# Patient Record
Sex: Male | Born: 1965 | Race: White | Hispanic: No | Marital: Single | Smoking: Never smoker
Health system: Southern US, Community
[De-identification: ages and names within clinical notes are randomized; demographics above are authoritative.]

## PROBLEM LIST (undated history)

## (undated) DIAGNOSIS — B079 Viral wart, unspecified: Secondary | ICD-10-CM

## (undated) DIAGNOSIS — T7840XA Allergy, unspecified, initial encounter: Secondary | ICD-10-CM

## (undated) DIAGNOSIS — K649 Unspecified hemorrhoids: Secondary | ICD-10-CM

## (undated) DIAGNOSIS — I341 Nonrheumatic mitral (valve) prolapse: Secondary | ICD-10-CM

## (undated) DIAGNOSIS — M25572 Pain in left ankle and joints of left foot: Secondary | ICD-10-CM

## (undated) DIAGNOSIS — G809 Cerebral palsy, unspecified: Secondary | ICD-10-CM

## (undated) DIAGNOSIS — I10 Essential (primary) hypertension: Secondary | ICD-10-CM

## (undated) DIAGNOSIS — E785 Hyperlipidemia, unspecified: Secondary | ICD-10-CM

## (undated) HISTORY — DX: Pain in left ankle and joints of left foot: M25.572

## (undated) HISTORY — DX: Viral wart, unspecified: B07.9

## (undated) HISTORY — DX: Hyperlipidemia, unspecified: E78.5

## (undated) HISTORY — DX: Essential (primary) hypertension: I10

## (undated) HISTORY — DX: Allergy, unspecified, initial encounter: T78.40XA

## (undated) HISTORY — DX: Nonrheumatic mitral (valve) prolapse: I34.1

## (undated) HISTORY — DX: Cerebral palsy, unspecified: G80.9

## (undated) HISTORY — DX: Unspecified hemorrhoids: K64.9

## (undated) HISTORY — PX: LEG SURGERY: SHX1003

---

## 2001-02-12 ENCOUNTER — Encounter: Payer: Self-pay | Admitting: Internal Medicine

## 2005-01-04 ENCOUNTER — Ambulatory Visit: Payer: Self-pay | Admitting: Internal Medicine

## 2009-03-01 ENCOUNTER — Ambulatory Visit: Payer: Self-pay | Admitting: Internal Medicine

## 2009-03-07 ENCOUNTER — Ambulatory Visit: Payer: Self-pay | Admitting: Internal Medicine

## 2009-03-09 LAB — CONVERTED CEMR LAB
ALT: 23 units/L (ref 0–53)
AST: 24 units/L (ref 0–37)
Basophils Absolute: 0 10*3/uL (ref 0.0–0.1)
CO2: 29 meq/L (ref 19–32)
Calcium: 9.1 mg/dL (ref 8.4–10.5)
Cholesterol: 279 mg/dL — ABNORMAL HIGH (ref 0–200)
Direct LDL: 207.7 mg/dL
Eosinophils Relative: 1.5 % (ref 0.0–5.0)
GFR calc non Af Amer: 85.81 mL/min (ref >60)
Glucose, Bld: 93 mg/dL (ref 70–99)
HCT: 42.8 % (ref 39.0–52.0)
HDL: 48.9 mg/dL (ref >39.00)
Lymphs Abs: 2.2 10*3/uL (ref 0.7–4.0)
Monocytes Absolute: 0.6 10*3/uL (ref 0.1–1.0)
Monocytes Relative: 11.3 % (ref 3.0–12.0)
Neutrophils Relative %: 46.5 % (ref 43.0–77.0)
Platelets: 296 10*3/uL (ref 150.0–400.0)
Potassium: 4.2 meq/L (ref 3.5–5.1)
RDW: 12.5 % (ref 11.5–14.6)
Sodium: 141 meq/L (ref 135–145)
Total CHOL/HDL Ratio: 6
Triglycerides: 93 mg/dL (ref 0.0–149.0)
WBC: 5.3 10*3/uL (ref 4.5–10.5)

## 2009-12-20 DIAGNOSIS — I10 Essential (primary) hypertension: Secondary | ICD-10-CM

## 2009-12-20 HISTORY — DX: Essential (primary) hypertension: I10

## 2009-12-21 ENCOUNTER — Ambulatory Visit: Payer: Self-pay | Admitting: Internal Medicine

## 2009-12-21 DIAGNOSIS — I10 Essential (primary) hypertension: Secondary | ICD-10-CM | POA: Insufficient documentation

## 2009-12-22 ENCOUNTER — Telehealth: Payer: Self-pay | Admitting: Internal Medicine

## 2009-12-26 ENCOUNTER — Telehealth (INDEPENDENT_AMBULATORY_CARE_PROVIDER_SITE_OTHER): Payer: Self-pay | Admitting: *Deleted

## 2009-12-30 ENCOUNTER — Encounter: Payer: Self-pay | Admitting: Internal Medicine

## 2009-12-30 ENCOUNTER — Telehealth (INDEPENDENT_AMBULATORY_CARE_PROVIDER_SITE_OTHER): Payer: Self-pay | Admitting: *Deleted

## 2010-01-11 ENCOUNTER — Ambulatory Visit: Payer: Self-pay | Admitting: Internal Medicine

## 2010-01-12 ENCOUNTER — Telehealth (INDEPENDENT_AMBULATORY_CARE_PROVIDER_SITE_OTHER): Payer: Self-pay | Admitting: *Deleted

## 2010-01-13 LAB — CONVERTED CEMR LAB
BUN: 12 mg/dL (ref 6–23)
Chloride: 104 meq/L (ref 96–112)
Creatinine, Ser: 0.8 mg/dL (ref 0.4–1.5)
GFR calc non Af Amer: 110.59 mL/min (ref >60)

## 2010-02-04 ENCOUNTER — Telehealth: Payer: Self-pay | Admitting: Family Medicine

## 2010-02-04 ENCOUNTER — Emergency Department (HOSPITAL_BASED_OUTPATIENT_CLINIC_OR_DEPARTMENT_OTHER): Admission: EM | Admit: 2010-02-04 | Discharge: 2010-02-04 | Payer: Self-pay | Admitting: Emergency Medicine

## 2010-02-09 ENCOUNTER — Ambulatory Visit: Payer: Self-pay | Admitting: Internal Medicine

## 2010-02-09 DIAGNOSIS — M549 Dorsalgia, unspecified: Secondary | ICD-10-CM | POA: Insufficient documentation

## 2010-04-11 ENCOUNTER — Ambulatory Visit: Payer: Self-pay | Admitting: Internal Medicine

## 2010-04-14 LAB — CONVERTED CEMR LAB
Calcium: 9.3 mg/dL (ref 8.4–10.5)
GFR calc non Af Amer: 104.43 mL/min (ref >60)
Potassium: 4.5 meq/L (ref 3.5–5.1)
Sodium: 138 meq/L (ref 135–145)

## 2010-04-19 DIAGNOSIS — E785 Hyperlipidemia, unspecified: Secondary | ICD-10-CM

## 2010-04-19 HISTORY — DX: Hyperlipidemia, unspecified: E78.5

## 2010-04-21 ENCOUNTER — Telehealth (INDEPENDENT_AMBULATORY_CARE_PROVIDER_SITE_OTHER): Payer: Self-pay | Admitting: *Deleted

## 2010-05-03 ENCOUNTER — Ambulatory Visit: Payer: Self-pay | Admitting: Internal Medicine

## 2010-05-09 ENCOUNTER — Telehealth (INDEPENDENT_AMBULATORY_CARE_PROVIDER_SITE_OTHER): Payer: Self-pay | Admitting: *Deleted

## 2010-05-09 LAB — CONVERTED CEMR LAB
ALT: 17 units/L (ref 0–53)
Basophils Absolute: 0 10*3/uL (ref 0.0–0.1)
Cholesterol: 268 mg/dL — ABNORMAL HIGH (ref 0–200)
Eosinophils Absolute: 0.1 10*3/uL (ref 0.0–0.7)
Eosinophils Relative: 1.3 % (ref 0.0–5.0)
HCT: 41.5 % (ref 39.0–52.0)
HDL: 58.6 mg/dL (ref >39.00)
Lymphs Abs: 1.3 10*3/uL (ref 0.7–4.0)
MCHC: 34.2 g/dL (ref 30.0–36.0)
MCV: 90.5 fL (ref 78.0–100.0)
Monocytes Absolute: 0.5 10*3/uL (ref 0.1–1.0)
Neutrophils Relative %: 58.1 % (ref 43.0–77.0)
Platelets: 326 10*3/uL (ref 150.0–400.0)
RDW: 14.2 % (ref 11.5–14.6)
Triglycerides: 56 mg/dL (ref 0.0–149.0)
VLDL: 11.2 mg/dL (ref 0.0–40.0)

## 2010-06-22 ENCOUNTER — Ambulatory Visit: Payer: Self-pay | Admitting: Internal Medicine

## 2010-06-28 LAB — CONVERTED CEMR LAB
ALT: 25 units/L (ref 0–53)
LDL Cholesterol: 97 mg/dL (ref 0–99)
Total CHOL/HDL Ratio: 3
Triglycerides: 69 mg/dL (ref 0.0–149.0)

## 2010-07-06 ENCOUNTER — Telehealth (INDEPENDENT_AMBULATORY_CARE_PROVIDER_SITE_OTHER): Payer: Self-pay | Admitting: *Deleted

## 2010-07-13 ENCOUNTER — Telehealth (INDEPENDENT_AMBULATORY_CARE_PROVIDER_SITE_OTHER): Payer: Self-pay | Admitting: *Deleted

## 2010-11-01 ENCOUNTER — Ambulatory Visit: Payer: Self-pay | Admitting: Internal Medicine

## 2010-11-03 DIAGNOSIS — E785 Hyperlipidemia, unspecified: Secondary | ICD-10-CM | POA: Insufficient documentation

## 2010-11-08 LAB — CONVERTED CEMR LAB
ALT: 24 units/L (ref 0–53)
BUN: 11 mg/dL (ref 6–23)
Calcium: 9.1 mg/dL (ref 8.4–10.5)
GFR calc non Af Amer: 101.38 mL/min (ref >60.00)
Glucose, Bld: 77 mg/dL (ref 70–99)
Sodium: 140 meq/L (ref 135–145)

## 2010-12-19 NOTE — Progress Notes (Signed)
Summary: Refill Request  Phone Note Refill Request Call back at [phone] Message from:  Pharmacy on April 21, 2010 8:42 AM  Refills Requested: Medication #1:  LOSARTAN POTASSIUM 50 MG TABS 1 by mouth once daily   Dosage confirmed as above?Dosage Confirmed   Supply Requested: 1 month   [facility]  Next Appointment Scheduled: 6.15.11 Initial call taken by: Harold Barban,  April 21, 2010 8:42 AM    Prescriptions: LOSARTAN POTASSIUM 50 MG TABS (LOSARTAN POTASSIUM) 1 by mouth once daily  #30 x 2   Entered by:   Jeremy Johann CMA   Authorized by:   Nolon Rod. Paz MD   Signed by:   Jeremy Johann CMA on 04/21/2010   Method used:   Faxed to ...       [facility] [phone]* (retail)       [address]       [place], [place]  [address]       Ph: [phone]       Fax: [phone]   RxID:   [id number]

## 2010-12-19 NOTE — Progress Notes (Signed)
Summary: lmom ref labs  Phone Note Outgoing Call   Call placed by: Kandice Hams,  January 12, 2010 4:06 PM Summary of Call: lmom to call ref ; lab results and Dr Drue Novel recommendations .Kandice Hams  January 12, 2010 4:07 PM  Initial call taken by: Kandice Hams,  January 12, 2010 4:07 PM  Follow-up for Phone Call        Spoke with pt informed of lab results; and Dr Drue Novel recommendations. Pt says feeling a lot better and ov scheduled .Kandice Hams  January 13, 2010 11:55 AM  Follow-up by: Kandice Hams,  January 13, 2010 11:55 AM

## 2010-12-19 NOTE — Progress Notes (Signed)
Summary: prior auth APPROVED for losartan  Phone Note From Pharmacy   Caller: fax from pharmacy [phone] Summary of Call: Prior auth required on Losaratan 50mm 2 by mouth once daily.  Plan limit exceeded.  [phone] Initial call taken by: Shary Decamp,  December 26, 2009 5:04 PM  Follow-up for Phone Call        prior auth in process for losartan-[employer] Follow-up by: Kandice Hams,  December 29, 2009 4:28 PM  Additional Follow-up for Phone Call Additional follow up Details #1::        PRIOR AUTH APPROVED  FOR LOSARTAN  UNTIL 12/29/10.  [facility] .Kandice Hams  December 30, 2009 10:04 AM  Additional Follow-up by: Kandice Hams,  December 30, 2009 10:02 AM

## 2010-12-19 NOTE — Progress Notes (Signed)
Summary: refill  Phone Note Refill Request Message from:  Fax from Pharmacy on July 06, 2010 9:43 AM  Refills Requested: Medication #1:  SIMVASTATIN 40 MG TABS Take 1 tab daily at bedtime. Leward Quan [place] - fax [phone]  Initial call taken by: Okey Regal Spring,  July 06, 2010 9:43 AM    Prescriptions: SIMVASTATIN 40 MG TABS (SIMVASTATIN) Take 1 tab daily at bedtime  #30 x 5   Entered by:   Army Fossa CMA   Authorized by:   Nolon Rod. Paz MD   Signed by:   Army Fossa CMA on 07/06/2010   Method used:   Electronically to        [facility] [phone]* (retail)       [address]       [place], [place]  [address]       Ph: [phone]       Fax: [phone]   RxID:   [id number]

## 2010-12-19 NOTE — Assessment & Plan Note (Signed)
Summary: CPX  - jr   Vital Signs:  Patient profile:   45 year old male Height:      67.25 inches Weight:      146 pounds Temp:     98.4 degrees F oral Pulse rate:   83 / minute BP sitting:   122 / 90  (left arm)  Vitals Entered By: Jeremy Johann CMA (May 03, 2010 12:40 PM) CC: 3 month f/u Comments --fasting REVIEWED MED LIST, PATIENT AGREED DOSE AND INSTRUCTION CORRECT    History of Present Illness: CPX  HTN: ambulatory BPs 120s/70 on losartan 50mg  a day  Allergies (verified): No Known Drug Allergies  Past History:  Past Medical History: Reviewed history from 12/21/2009 and no changes required. 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 Leg surgery to release muscles d/t "cerebral palsy"  Family History: Reviewed history from 03/01/2009 and no changes required. 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 CV:  Denies chest pain or discomfort and swelling of feet. Resp:  Denies cough and shortness of breath. GI:  Denies bloody stools, diarrhea, nausea, and vomiting. GU:  Denies dysuria and hematuria. MS:  occasionally LBP, rarely . Psych:  Denies anxiety and depression; feels well emotionally .  Physical Exam  General:  alert, well-developed, and well-nourished.   Neck:  no masses, no thyromegaly, and normal carotid upstroke.   Lungs:  normal respiratory effort, no intercostal retractions, no accessory muscle use, and normal breath sounds.   Heart:  normal rate, regular rhythm, and no murmur.   Abdomen:   soft, non-tender, no distention, no masses, no guarding, and no rigidity.   Extremities:  no pretibial edema bilaterally  Neurologic:  alert & oriented X3.   Psych:  memory intact for recent and remote, normally interactive, good eye contact, not anxious appearing, and not depressed appearing.     Impression & Recommendations:  Problem # 1:  HEALTH SCREENING (ICD-V70.0) Td 2005 labs continue healthy lifestyle, has lost several pounds.    Orders: Venipuncture (38101) TLB-Lipid Panel (80061-LIPID) TLB-TSH (Thyroid Stimulating Hormone) (84443-TSH) TLB-CBC Platelet - w/Differential (85025-CBCD) TLB-ALT (SGPT) (84460-ALT) TLB-AST (SGOT) (84450-SGOT)  Problem # 2:  ESSENTIAL HYPERTENSION (ICD-401.9) ambulatory BPs are  well controlled, diastolic BP today slightly elevated Plan: No change Will bring his  BP machine to be  checked against ours  His updated medication list for this problem includes:    Losartan Potassium 50 Mg Tabs (Losartan potassium) .Marland Kitchen... 1 by mouth once daily  BP today: 122/90 Prior BP: 120/90 (02/09/2010)  Labs Reviewed: K+: 4.5 (04/11/2010) Creat: : 0.8 (04/11/2010)   Chol: 279 (03/07/2009)   HDL: 48.90 (03/07/2009)   TG: 93.0 (03/07/2009)  Complete Medication List: 1)  Losartan Potassium 50 Mg Tabs (Losartan potassium) .Marland Kitchen.. 1 by mouth once daily  Patient Instructions: 1)  continue checking her BP, goal is a  BP of  less than 140-85. 2)  Please bring your BP machine at your convenience and check it with my nurse  3)  Please schedule a follow-up appointment in 6 months .  Prescriptions: LOSARTAN POTASSIUM 50 MG TABS (LOSARTAN POTASSIUM) 1 by mouth once daily  #90 x 3   Entered and Authorized by:   Elita Quick E. Kashae Carstens MD   Signed by:   Nolon Rod. Marcella Dunnaway MD on 05/03/2010   Method used:   Print then Give to Patient   RxID:   [id number]

## 2010-12-19 NOTE — Medication Information (Signed)
Summary: Prior Authorization & Approval for Losartan [place]  Prior Authorization & Approval for Losartan [employer]   Imported By: Lanelle Bal 01/03/2010 11:32:37  _____________________________________________________________________  External Attachment:    Type:   Image     Comment:   External Document

## 2010-12-19 NOTE — Assessment & Plan Note (Signed)
Summary: F/u on BP still running high - jr   Vital Signs:  Patient profile:   45 year old male Height:      67.25 inches Weight:      160 pounds BMI:     24.96 Pulse rate:   82 / minute BP sitting:   140 / 96  Vitals Entered By: Shary Decamp (December 21, 2009 11:51 AM) CC: rov - elevated bp Is Patient Diabetic? 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  Physical Exam  General:  alert, well-developed, and well-nourished.   Lungs:  normal respiratory effort, no intercostal retractions, no accessory muscle use, and normal breath sounds.   Heart:  normal rate, regular rhythm, and no murmur.   Extremities:  no pretibial edema bilaterally    Impression & Recommendations:  Problem # 1:  ESSENTIAL HYPERTENSION (ICD-401.9)  EKG NSR start meds   BP today: 140/96 Prior BP: 156/92 (03/01/2009)  Labs Reviewed: K+: 4.2 (03/07/2009) Creat: : 1.0 (03/07/2009)   Chol: 279 (03/07/2009)   HDL: 48.90 (03/07/2009)   TG: 93.0 (03/07/2009)  His updated medication list for this problem includes:    Losartan Potassium 50 Mg Tabs (Losartan potassium) .Marland Kitchen... 2 by  mouth once daily  Complete Medication List: 1)  Cordran 4 Mcg/sqcm Tape (Flurandrenolide) 2)  Losartan Potassium 50 Mg Tabs (Losartan potassium) .... 2 by mouth once daily  Other Orders: EKG w/ Interpretation (93000)  Patient Instructions: 1)  start losartan 50mg  1 a day 2)  check your BP daily , call if side effects  3)  increase to 2 tabs a day in one week if the BP is still > 140/85 4)  low salt diet! 5)  30 minutes of exercise a day! 6)  nurse visit in 3 weeks for BP check and BMP  7)  Please schedule a follow-up appointment in 3 months .  Prescriptions: LOSARTAN POTASSIUM 50 MG TABS (LOSARTAN POTASSIUM) 2 by mouth once daily  #60 x 1   Entered and Authorized by:   Elita Quick E. Sway Guttierrez MD   Signed by:   Nolon Rod. Cloie Wooden MD on 12/21/2009   Method used:   Print then Give to Patient   RxID:   [id number]

## 2010-12-19 NOTE — Progress Notes (Signed)
Summary: back out can't walk   Phone Note Call from Patient Call back at [phone]   Caller: Mom-Sylvia Summary of Call: Son hurt back now having trouble getting up to walk and lots of pain informed to go to emergency room will need xrays to make sure no pinch nerves or buldging disc, also to make sure nothing herniated mom agreed will take patient to [place]. Initial call taken by: Doristine Devoid,  February 04, 2010 11:11 AM  Follow-up for Phone Call        note reviewed per nurse.  Agree with advice for ER evaluation given hx of trauma and inablility to ambulate.  Wil likely need some x-rays. Follow-up by: Evelena Peat MD,  February 04, 2010 12:06 PM

## 2010-12-19 NOTE — Progress Notes (Signed)
Summary: -lab  Phone Note Outgoing Call   Call placed by: [facility] CMA,  May 09, 2010 4:34 PM Details for Reason: advise patient: Despite his healthy lifestyle, his cholesterol continued to be very elevated. He has a family history of heart disease I recommend to start simvastatin 40 mg daily if patient is willing to do so, call in a prescription and arrange FLP, AST, ALT in 6 weeks dx ---hyperlipidemia rest of the labs are normal Summary of Call: left message to call  office.Marland KitchenMarland KitchenMarland KitchenFelecia Deloach CMA  May 09, 2010 4:34 PM   Follow-up for Phone Call        DISCUSS WITH PATIENT, will give a return call once he speak with sister who is a nurse  about med.............Marland KitchenFelecia Deloach CMA  May 10, 2010 8:39 AM  rx sent in to pharmacy letter mailed .Marland KitchenMarland KitchenFelecia Deloach CMA  May 10, 2010 8:44 AM     New/Updated Medications: SIMVASTATIN 40 MG TABS (SIMVASTATIN) Take 1 tab daily at bedtime Prescriptions: SIMVASTATIN 40 MG TABS (SIMVASTATIN) Take 1 tab daily at bedtime  #30 x 1   Entered by:   Jeremy Johann CMA   Authorized by:   Nolon Rod. Paz MD   Signed by:   Jeremy Johann CMA on 05/10/2010   Method used:   Faxed to ...       [facility] [phone]* (retail)       [address]       [place], [place]  [address]       Ph: [phone]       Fax: [phone]   RxID:   [id number]

## 2010-12-19 NOTE — Progress Notes (Signed)
Summary: refill - sent to pharm [phone] - didnt receive  Phone Note Refill Request Message from:  Fax from Pharmacy on July 13, 2010 8:26 AM  Refills Requested: Medication #1:  SIMVASTATIN 40 MG TABS Take 1 tab daily at bedtime. pharmacy sent another request for refill - 1st refill sent [phone] [address] fax [phone] Jani Files [phone]  Initial call taken by: Okey Regal Spring,  July 13, 2010 8:27 AM    Prescriptions: SIMVASTATIN 40 MG TABS (SIMVASTATIN) Take 1 tab daily at bedtime  #30 x 5   Entered by:   Army Fossa CMA   Authorized by:   Nolon Rod. Paz MD   Signed by:   Army Fossa CMA on 07/13/2010   Method used:   Electronically to        [facility] [phone]* (retail)       [address]       [place], [place]  [address]       Ph: [phone]       Fax: [phone]   RxID:   [id number]

## 2010-12-19 NOTE — Assessment & Plan Note (Signed)
Summary: 3 wk. bp check - jr  Nurse Visit   Vital Signs:  Patient profile:   45 year old male Height:      67.25 inches Pulse rate:   60 / minute BP sitting:   140 / 82  Vitals Entered By: Shary Decamp (January 11, 2010 10:36 AM) CC: bp check, bmp drawn today Comments  - pt states that he still "gets lightheaded" & feels "fuzzy" BP READINGS @ HOME: 120's-130's/80's Shary Decamp  January 11, 2010 2:10 PM    Impression & Recommendations:  Problem # 1:  ESSENTIAL HYPERTENSION (ICD-401.9)  His updated medication list for this problem includes:    Losartan Potassium 50 Mg Tabs (Losartan potassium) .Marland Kitchen... 1 by mouth once daily  Orders: Venipuncture (16109) TLB-BMP (Basic Metabolic Panel-BMET) (80048-METABOL)  BP today: 140/82 Prior BP: 140/96 (12/21/2009)  Labs Reviewed: K+: 4.2 (03/07/2009) Creat: : 1.0 (03/07/2009)   Chol: 279 (03/07/2009)   HDL: 48.90 (03/07/2009)   TG: 93.0 (03/07/2009)  Complete Medication List: 1)  Cordran 4 Mcg/sqcm Tape (Flurandrenolide) 2)  Losartan Potassium 50 Mg Tabs (Losartan potassium) .Marland Kitchen.. 1 by mouth once daily   Current Medications (verified): 1)  Cordran 4 Mcg/sqcm Tape (Flurandrenolide) 2)  Losartan Potassium 50 Mg Tabs (Losartan Potassium) .Marland Kitchen.. 1 By Mouth Once Daily  Allergies (verified): No Known Drug Allergies  Orders Added: 1)  Venipuncture [36415] 2)  TLB-BMP (Basic Metabolic Panel-BMET) [80048-METABOL] 3)  Est. Patient Level I [60454]

## 2010-12-19 NOTE — Progress Notes (Signed)
  Phone Note Call from Patient   Caller: Patient Summary of Call: pt called has a cough  and on Losartan for BP, wanted to know what med could he use for cough. Recommend Coricidin HBP. Call office if no better. Pt agreed .Kandice Hams  December 30, 2009 11:55 AM  Initial call taken by: Kandice Hams,  December 30, 2009 11:55 AM

## 2010-12-19 NOTE — Progress Notes (Signed)
Summary: s/e  Phone Note Call from Patient Call back at [phone], [phone]   Summary of Call: Patient left a note @ the front desk: "I took first dose of losartan & a couple of h ours later felt light headed & stomach was gassy.  Is this something that my body will adapt to & s/e go away?"  Spoke with pt - sxs lasted about 1 hour after taking 1 tablet.  He felt "drugged" & fatigued.  Did not check BP.  He had really bad gas pains that lasted through the night [facility]  December 22, 2009 12:32 PM   Follow-up for Phone Call        losartan can cause dyspepsia (GI symptoms) suggest re-try 1 or 2 more days, if unable to tolerate will switch to something else. If  "drugged", suggest to check his BP Emberlynn Riggan E. Elany Felix MD  December 22, 2009 1:15 PM   discussed with pt Shary Decamp  December 22, 2009 2:42 PM

## 2010-12-19 NOTE — Assessment & Plan Note (Signed)
Summary: er/fu/kdc   Vital Signs:  Patient profile:   45 year old male Height:      67.25 inches Weight:      153 pounds BMI:     23.87 Pulse rate:   66 / minute BP sitting:   120 / 90  Vitals Entered By: R.Peeler CC: er f/u Comments lower back pain "hard to get up after sitting" er prescribed vicodin, diazepam   History of Present Illness: 3 weeks ago he developed a sudden, quite intense, sharp low back pain when  he reached down to tide his  shoes since then he was left with a soreness in the lower back  6 days ago, he developed a  bilateral, low back pain this pain is described as a spasm, "a gripp in my back" on and off, increase with change in position went to the ER, records reviewed: he  got  Toradol and Valium and got better, was released home with Norco # 15 and Valium #12  at present, the  pain is still there but not as severe  review of systems no fever no bladder or bowel incontinence denies previous episodes like this one no recent injury or previous surgeries in his  back  Allergies: No Known Drug Allergies  Past History:  Past Medical History: Reviewed history from 12/21/2009 and no changes required. 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 Leg surgery to release muscles d/t "cerebral palsy"  Review of Systems      See HPI  Physical Exam  General:  alert and well-developed.   Msk:  not tender to palpation on the spine he does seem to be in pain when he lays down in the table to be examined Neurologic:  walks w/ more  difficulty than usual DTRs in the lower extremities are symmetric strength seems symmetric    Impression & Recommendations:  Problem # 1:  BACK PAIN (ICD-724.5) acute back pain,  will treat in the standard  fashion: Prednisone, muscle relaxants, painkillers if no better, will refer to Dr.Kirstein His updated medication list for this problem includes:    Aleve 220 Mg Tabs (Naproxen sodium) .Marland Kitchen... 1 daily    Vicodin 5-500 Mg Tabs (Hydrocodone-acetaminophen) ..... One by mouth 3 times a day  as needed    Flexeril 10 Mg Tabs (Cyclobenzaprine hcl) ..... One by mouth twice a day and  as needed for pain  Complete Medication List: 1)  Cordran 4 Mcg/sqcm Tape (Flurandrenolide) 2)  Losartan Potassium 50 Mg Tabs (Losartan potassium) .Marland Kitchen.. 1 by mouth once daily 3)  Aleve 220 Mg Tabs (Naproxen sodium) .Marland Kitchen.. 1 daily 4)  Vicodin 5-500 Mg Tabs (Hydrocodone-acetaminophen) .... One by mouth 3 times a day  as needed 5)  Flexeril 10 Mg Tabs (Cyclobenzaprine hcl) .... One by mouth twice a day and  as needed for pain 6)  Prednisone 10 Mg Tabs (Prednisone) .... 4 x2, 3x2,2x2, 1x2  Patient Instructions: 1)  rest, warm compress 2)  prednisone for a few days 3)  Flexeril twice a day as needed, watch for drowsiness 4)  for pain take Aleve, Vicodin if the pain is severe, watch for drowsiness 5)  Call in two weeks if no  better Prescriptions: VICODIN 5-500 MG TABS (HYDROCODONE-ACETAMINOPHEN) one by mouth 3 times  a day  as needed  #40 x 0   Entered and Authorized by:   Nolon Rod. Thalia Turkington MD   Signed by:   Nolon Rod. Brenlee Koskela MD on 02/09/2010   Method used:   Print then Give to Patient   RxID:   [id number] PREDNISONE 10 MG TABS (PREDNISONE) 4 x2, 3x2,2x2, 1x2  #20 x 0   Entered and Authorized by:   Nolon Rod. Lesley Atkin MD   Signed by:   Nolon Rod. Decklin Weddington MD on 02/09/2010   Method used:   Print then Give to Patient   RxID:   [id number] FLEXERIL 10 MG TABS (CYCLOBENZAPRINE HCL) one by mouth twice a day and  as needed for pain  #40 x 0   Entered and Authorized by:   Nolon Rod. Michall Noffke MD   Signed by:   Nolon Rod. Porshea Janowski MD on 02/09/2010   Method used:   Print then Give to Patient   RxID:   [id number]

## 2010-12-21 NOTE — Assessment & Plan Note (Signed)
Summary: 6 month ov/nta   Vital Signs:  Patient profile:   45 year old male Weight:      151 pounds Pulse rate:   70 / minute Pulse rhythm:   regular BP sitting:   146 / 84  (left arm) Cuff size:   large  Vitals Entered By: Army Fossa CMA (November 03, 2010 10:19 AM) CC: 6 month f/u- fsating  Comments PTs BP machine- [address] [facility] [address]   History of Present Illness: ROV tired, not exercising d/t work schedule   ROS ambulatory BPs in the 120/70 per his machine (which read a litter higher than ours) diet-- not as good as before, eating out more frecuent good medication compliance w/ cholesterol med no unusual myalgias  CP (-)  SOB (-)  edema (-)      Current Medications (verified): 1)  Losartan Potassium 50 Mg Tabs (Losartan Potassium) .Marland Kitchen.. 1 By Mouth Once Daily 2)  Simvastatin 40 Mg Tabs (Simvastatin) .... 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 Leg surgery to release muscles d/t "cerebral palsy"  Social History: Reviewed history from 05/03/2010 and no changes required. 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   Lungs:  normal respiratory effort, no intercostal retractions, no accessory muscle use, and normal breath sounds.   Heart:  normal rate, regular rhythm, and no murmur.   Extremities:  no pretibial edema bilaterally    Impression & Recommendations:  Problem # 1:  ESSENTIAL HYPERTENSION (ICD-401.9) good ambulatory BPs  no change  His updated medication list for this problem  includes:    Losartan Potassium 50 Mg Tabs (Losartan potassium) .Marland Kitchen... 1 by mouth once daily  BP today: 146/84 Prior BP: 122/90 (05/03/2010)  Labs Reviewed: K+: 4.5 (04/11/2010) Creat: : 0.8 (04/11/2010)   Chol: 160 (06/22/2010)   HDL: 49.40 (06/22/2010)   LDL: 97 (06/22/2010)   TG: 69.0 (06/22/2010)  Orders: Venipuncture (00938) TLB-BMP (Basic Metabolic Panel-BMET) (80048-METABOL) Specimen Handling (18299)  Problem # 2:  HYPERLIPIDEMIA (ICD-272.4) results reviewed no change  His updated medication list for this problem includes:    Simvastatin 40 Mg Tabs (Simvastatin) .Marland Kitchen... Take 1 tab daily at bedtime  Labs Reviewed: SGOT: 27 (06/22/2010)   SGPT: 25 (06/22/2010)   HDL:49.40 (06/22/2010), 58.60 (05/03/2010)  LDL:97 (06/22/2010)  Chol:160 (06/22/2010), 268 (05/03/2010)  Trig:69.0 (06/22/2010), 56.0 (05/03/2010)  Orders: TLB-ALT (SGPT) (84460-ALT) TLB-AST (SGOT) (84450-SGOT) Specimen Handling (37169)  Complete Medication List: 1)  Losartan Potassium 50 Mg Tabs (Losartan potassium) .Marland Kitchen.. 1 by mouth once daily 2)  Simvastatin 40 Mg Tabs (Simvastatin) .... Take 1 tab daily at bedtime  Patient Instructions: 1)  Please schedule a follow-up appointment in 6 months, fasting, physical exam Prescriptions: SIMVASTATIN 40 MG TABS (SIMVASTATIN) Take 1 tab daily at bedtime  #90 x 3   Entered and Authorized by:   Nolon Rod. Jovonni Borquez MD   Signed by:   Nolon Rod. Ottie Tillery MD on 11/03/2010   Method used:   Print then Give to Patient   RxID:   [id number] LOSARTAN POTASSIUM 50 MG TABS (LOSARTAN  POTASSIUM) 1 by mouth once daily  #90 x 3   Entered and Authorized by:   Nolon Rod. Estel Tonelli MD   Signed by:   Nolon Rod. Jenipher Havel MD on 11/03/2010   Method used:   Print then Give to Patient   RxID:   [id number]    Orders Added: 1)  Venipuncture [21308] 2)  TLB-BMP (Basic Metabolic Panel-BMET) [80048-METABOL] 3)  TLB-ALT (SGPT) [84460-ALT] 4)  TLB-AST (SGOT) [84450-SGOT] 5)  Specimen Handling [99000] 6)  Est.  Patient Level III [65784]   Immunization History:  Influenza Immunization History:    Influenza:  per pt  (08/19/2010)   Immunization History:  Influenza Immunization History:    Influenza:  per pt  (08/19/2010)

## 2011-01-15 ENCOUNTER — Telehealth (INDEPENDENT_AMBULATORY_CARE_PROVIDER_SITE_OTHER): Payer: Self-pay | Admitting: *Deleted

## 2011-01-25 NOTE — Progress Notes (Signed)
Summary: refill  Phone Note Refill Request Message from:  Fax from Pharmacy on January 15, 2011 10:18 AM  Refills Requested: Medication #1:  SIMVASTATIN 40 MG TABS Take 1 tab daily at bedtime. [facility] - fax [phone]  Initial call taken by: Okey Regal [place],  January 15, 2011 10:18 AM  Follow-up for Phone Call        Pt lost rx that was given to him. Resent in Zocor. Army Fossa CMA  January 15, 2011 10:40 AM     Prescriptions: SIMVASTATIN 40 MG TABS (SIMVASTATIN) Take 1 tab daily at bedtime  #90 x 0   Entered by:   Army Fossa CMA   Authorized by:   Nolon Rod. Paz MD   Signed by:   Army Fossa CMA on 01/15/2011   Method used:   Electronically to        [facility] [phone]* (retail)       [address]       [place], [place]  [address]       Ph: [phone]       Fax: [phone]   RxID:   [id number]

## 2011-05-07 ENCOUNTER — Other Ambulatory Visit: Payer: Self-pay | Admitting: *Deleted

## 2011-05-07 MED ORDER — SIMVASTATIN 40 MG PO TABS: 40.0000 mg | ORAL_TABLET | Freq: Every evening | ORAL | Status: DC

## 2011-05-09 ENCOUNTER — Encounter: Payer: Self-pay | Admitting: Internal Medicine

## 2011-05-16 ENCOUNTER — Other Ambulatory Visit: Payer: Self-pay | Admitting: Internal Medicine

## 2011-05-16 MED ORDER — LOSARTAN POTASSIUM 50 MG PO TABS: 50.0000 mg | ORAL_TABLET | Freq: Every day | ORAL | Status: DC

## 2011-05-16 NOTE — Telephone Encounter (Signed)
meds sent in

## 2011-05-18 ENCOUNTER — Encounter: Payer: Self-pay | Admitting: Internal Medicine

## 2011-05-21 ENCOUNTER — Ambulatory Visit (INDEPENDENT_AMBULATORY_CARE_PROVIDER_SITE_OTHER): Payer: 59 | Admitting: Internal Medicine

## 2011-05-21 ENCOUNTER — Encounter: Payer: Self-pay | Admitting: Internal Medicine

## 2011-05-21 DIAGNOSIS — Z0000 Encounter for general adult medical examination without abnormal findings: Secondary | ICD-10-CM | POA: Insufficient documentation

## 2011-05-21 DIAGNOSIS — I10 Essential (primary) hypertension: Secondary | ICD-10-CM

## 2011-05-21 LAB — CBC WITH DIFFERENTIAL/PLATELET
Basophils Absolute: 0 10*3/uL (ref 0.0–0.1)
Eosinophils Relative: 1.7 % (ref 0.0–5.0)
HCT: 42.6 % (ref 39.0–52.0)
Hemoglobin: 14.5 g/dL (ref 13.0–17.0)
Lymphs Abs: 1.3 10*3/uL (ref 0.7–4.0)
MCV: 91.1 fl (ref 78.0–100.0)
Monocytes Absolute: 0.4 10*3/uL (ref 0.1–1.0)
Neutro Abs: 2.9 10*3/uL (ref 1.4–7.7)
Platelets: 266 10*3/uL (ref 150.0–400.0)
RDW: 13.3 % (ref 11.5–14.6)

## 2011-05-21 LAB — COMPREHENSIVE METABOLIC PANEL
ALT: 20 U/L (ref 0–53)
Alkaline Phosphatase: 42 U/L (ref 39–117)
Creatinine, Ser: 1 mg/dL (ref 0.4–1.5)
Sodium: 139 mEq/L (ref 135–145)
Total Bilirubin: 0.8 mg/dL (ref 0.3–1.2)
Total Protein: 7.1 g/dL (ref 6.0–8.3)

## 2011-05-21 LAB — LIPID PANEL
Total CHOL/HDL Ratio: 3
VLDL: 13 mg/dL (ref 0.0–40.0)

## 2011-05-21 LAB — TSH: TSH: 0.55 u[IU]/mL (ref 0.35–5.50)

## 2011-05-21 MED ORDER — LOSARTAN POTASSIUM 50 MG PO TABS: 50.0000 mg | ORAL_TABLET | Freq: Every day | ORAL | Status: DC

## 2011-05-21 MED ORDER — SIMVASTATIN 40 MG PO TABS: 40.0000 mg | ORAL_TABLET | Freq: Every evening | ORAL | Status: DC

## 2011-05-21 NOTE — Patient Instructions (Signed)
Check the  blood pressure 2 or 3 times a month, be sure it is less than 140/85. If it is consistently higher, let me know  

## 2011-05-21 NOTE — Progress Notes (Signed)
  Subjective:    Patient ID: Adrian Schneider, male    DOB: 1966/08/27, 45 y.o.   MRN: [id number]  HPI Complete physical exam, feeling well  Past Medical History  Diagnosis Date  . MVP (mitral valve prolapse)     dx years ago  . 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 Wart     L foot, non- healing lesion, f/u by derm  . Hypertension 12/2009  . Hyperlipidemia 6/11    started meds    Past Surgical History  Procedure Date  . 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   Cardiovascular: Negative for chest pain and leg swelling.  Gastrointestinal: Negative for abdominal pain and blood in stool.  Genitourinary: Negative for hematuria and difficulty urinating.  Psychiatric/Behavioral:       No anxiety-depression.       Objective:   Physical Exam  Constitutional: He is oriented to person, place, and time. He appears well-developed and well-nourished. No distress.  HENT:  Head: Normocephalic and atraumatic.  Neck: No thyromegaly present.       Normal carotid pulses bilaterally  Cardiovascular: Normal rate, regular rhythm and normal heart sounds.   No murmur heard. Pulmonary/Chest: Effort normal and breath sounds normal. No respiratory distress. He has no wheezes. He has no rales.  Abdominal: Soft. Bowel sounds are normal. He exhibits no distension. There is no tenderness. There is no rebound and no guarding.  Musculoskeletal: He exhibits no edema.  Neurological: He is alert and oriented to person, place, and time.  Skin: Skin is warm and dry. He is not diaphoretic.  Psychiatric: He has a normal mood and affect. His  behavior is normal. Judgment and thought content normal.          Assessment & Plan:

## 2011-05-21 NOTE — Assessment & Plan Note (Signed)
Well-controlled, refill meds. Return to the office in one year

## 2011-05-21 NOTE — Assessment & Plan Note (Signed)
Td 05 Never had a cscope Labs diet-exercise discussed

## 2011-05-25 ENCOUNTER — Telehealth: Payer: Self-pay | Admitting: *Deleted

## 2011-05-25 NOTE — Telephone Encounter (Signed)
Message left for patient to return my call.  

## 2011-05-25 NOTE — Telephone Encounter (Signed)
Message copied by Leanne Lovely on Fri May 25, 2011  1:28 PM ------      Message from: Willow Ora E      Created: Fri May 25, 2011  1:24 PM       Advise patient      Cholesterol well controlled, liver tests normal.      All other labs normal. Very good results !

## 2011-05-28 NOTE — Telephone Encounter (Signed)
Pt is aware.  

## 2011-07-05 ENCOUNTER — Other Ambulatory Visit: Payer: Self-pay | Admitting: Internal Medicine

## 2011-07-05 NOTE — Telephone Encounter (Signed)
Rx Denied#90x3 given 05/21/11; per pharmacy filled Rx on 06/20/11

## 2012-02-08 ENCOUNTER — Ambulatory Visit (INDEPENDENT_AMBULATORY_CARE_PROVIDER_SITE_OTHER): Payer: 59 | Admitting: Internal Medicine

## 2012-02-08 VITALS — BP 134/86 | HR 81 | Temp 98.8°F | Wt 166.0 lb

## 2012-02-08 DIAGNOSIS — R1030 Lower abdominal pain, unspecified: Secondary | ICD-10-CM

## 2012-02-08 DIAGNOSIS — R109 Unspecified abdominal pain: Secondary | ICD-10-CM

## 2012-02-08 NOTE — Progress Notes (Signed)
  Subjective:    Patient ID: LLIAM HOH, male    DOB: 04/20/1966, 46 y.o.   MRN: [id number]  HPI Acute visit Several month history of pain at the  right groin & right tight. Pain is exacerbated by lifting the leg or even sneezing.  Past Medical History  Diagnosis Date  . MVP (mitral valve prolapse)     dx years ago  . 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 Wart     L foot, non- healing lesion, f/u by derm  . Hypertension 12/2009  . Hyperlipidemia 6/11    started meds    PSH R leg surgery due to CP at age 78 , several procedures done during a single surgery  Review of Systems Denies fever, chills No nausea, vomiting or abdominal pain. No testicular pain. No mass in the groin.    Objective:   Physical Exam  Other oriented x3, no apparent distress. Abdomen soft, nontender. No inguinal hernias. GU: Scrotal contents normal, penis normal. No groin LADs Musculoskeletal, gait is uneven do to cerebral palsy      Assessment & Plan:

## 2012-02-08 NOTE — Assessment & Plan Note (Signed)
Several month history of right groin pain with musculoskeletal features, suspect a hip problem likely from uneven gait. Refer to ortho

## 2012-02-10 ENCOUNTER — Encounter: Payer: Self-pay | Admitting: Internal Medicine

## 2012-03-20 ENCOUNTER — Other Ambulatory Visit: Payer: Self-pay | Admitting: Internal Medicine

## 2012-03-20 NOTE — Telephone Encounter (Signed)
Refill done.  

## 2012-05-05 ENCOUNTER — Telehealth: Payer: Self-pay | Admitting: Internal Medicine

## 2012-05-05 NOTE — Telephone Encounter (Signed)
Advise patient, rest, drink plenty of fluids, check BP, if it is persistently less than 100/60 let us know. If he has severe symptoms: Dizziness, weakness needs to call or go to urgent care

## 2012-05-05 NOTE — Telephone Encounter (Signed)
Caller: Adrian Schneider/Patient; PCP: Willow Ora; CB#: [phone]; ; ; Call regarding Poisoning/Adult;  Pt calling regarding he took Losartan this am and possibly may have taken again. Was groggy this am and unsure if he took 2 times today 05/05/12. Emergent s/s for Poisoning r/o per protocol except for call Poison Center immediately due to accidently took more than the prescribed dosage, conf with [place] at [employer].

## 2012-05-05 NOTE — Telephone Encounter (Signed)
Discussed with pt

## 2012-05-21 ENCOUNTER — Encounter: Payer: 59 | Admitting: Internal Medicine

## 2012-05-21 ENCOUNTER — Other Ambulatory Visit: Payer: Self-pay | Admitting: Internal Medicine

## 2012-05-21 NOTE — Telephone Encounter (Signed)
Refill done.  

## 2012-05-21 NOTE — Telephone Encounter (Signed)
Okay 30 and 2 refills 

## 2012-05-26 ENCOUNTER — Encounter: Payer: 59 | Admitting: Internal Medicine

## 2012-06-20 ENCOUNTER — Ambulatory Visit (INDEPENDENT_AMBULATORY_CARE_PROVIDER_SITE_OTHER): Payer: 59 | Admitting: Internal Medicine

## 2012-06-20 ENCOUNTER — Encounter: Payer: Self-pay | Admitting: Internal Medicine

## 2012-06-20 VITALS — BP 136/88 | HR 79 | Temp 98.3°F | Ht 68.0 in | Wt 161.0 lb

## 2012-06-20 DIAGNOSIS — I10 Essential (primary) hypertension: Secondary | ICD-10-CM

## 2012-06-20 DIAGNOSIS — R1030 Lower abdominal pain, unspecified: Secondary | ICD-10-CM

## 2012-06-20 DIAGNOSIS — R319 Hematuria, unspecified: Secondary | ICD-10-CM

## 2012-06-20 DIAGNOSIS — Z0000 Encounter for general adult medical examination without abnormal findings: Secondary | ICD-10-CM

## 2012-06-20 LAB — COMPREHENSIVE METABOLIC PANEL
ALT: 32 U/L (ref 0–53)
Albumin: 4.2 g/dL (ref 3.5–5.2)
CO2: 26 mEq/L (ref 19–32)
GFR: 91.99 mL/min (ref >60.00)
Glucose, Bld: 108 mg/dL — ABNORMAL HIGH (ref 70–99)
Potassium: 3.8 mEq/L (ref 3.5–5.1)
Sodium: 137 mEq/L (ref 135–145)
Total Bilirubin: 0.8 mg/dL (ref 0.3–1.2)
Total Protein: 7.1 g/dL (ref 6.0–8.3)

## 2012-06-20 LAB — CBC WITH DIFFERENTIAL/PLATELET
Basophils Absolute: 0 10*3/uL (ref 0.0–0.1)
HCT: 42.8 % (ref 39.0–52.0)
Lymphs Abs: 1.4 10*3/uL (ref 0.7–4.0)
MCV: 91 fl (ref 78.0–100.0)
Monocytes Absolute: 0.4 10*3/uL (ref 0.1–1.0)
Monocytes Relative: 8.6 % (ref 3.0–12.0)
Neutrophils Relative %: 62.5 % (ref 43.0–77.0)
Platelets: 276 10*3/uL (ref 150.0–400.0)
RDW: 13.8 % (ref 11.5–14.6)
WBC: 5.2 10*3/uL (ref 4.5–10.5)

## 2012-06-20 LAB — LIPID PANEL
Cholesterol: 158 mg/dL (ref 0–200)
Triglycerides: 84 mg/dL (ref 0.0–149.0)

## 2012-06-20 LAB — POCT URINALYSIS DIPSTICK
Bilirubin, UA: NEGATIVE
Ketones, UA: NEGATIVE
Leukocytes, UA: NEGATIVE
Spec Grav, UA: <=1.005

## 2012-06-20 MED ORDER — LOSARTAN POTASSIUM 50 MG PO TABS: 50.0000 mg | ORAL_TABLET | Freq: Every day | ORAL | Status: DC

## 2012-06-20 NOTE — Assessment & Plan Note (Signed)
Td 05 Never had a cscope Labs diet-exercise improving  Had urinary sx, see ROS, DRE normal, check a UA and PSA Has pain in the thumbs ?tendinitis ?DJD ?overuse , if sx persist may benefit from a local injection

## 2012-06-20 NOTE — Patient Instructions (Signed)
Check the  blood pressure 2 or 3 times a week, be sure it is between 110/60 and 140/85. If it is consistently higher or lower, let me know  

## 2012-06-20 NOTE — Assessment & Plan Note (Signed)
Excellent control.   

## 2012-06-20 NOTE — Assessment & Plan Note (Signed)
Saw orthopedic surgery, doing physical therapy

## 2012-06-20 NOTE — Progress Notes (Signed)
  Subjective:    Patient ID: Adrian Schneider, male    DOB: 1966/10/24, 46 y.o.   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 Urinary frequency and inability to empty his bladder. Denies dysuria, gross hematuria, penile discharge or any genital rash. Symptoms are now gone. Had hip pain, status post orthopedic evaluation, doing physical therapy. Occasionally pain at the base of the thumbs. He uses his hands a lot at work. Denies fever chills, no nausea, vomiting, diarrhea or blood in the stools. No anxiety or depression. Good medication compliance, ambulatory BPs 120/70-80 consistently      Objective:   Physical Exam  General -- alert, well-developed, and well-nourished.   Neck --no thyromegaly Lungs -- normal respiratory effort, no intercostal retractions, no accessory muscle use, and normal breath sounds.   Heart-- normal rate, regular rhythm, no murmur, and no gallop.   Abdomen--soft, non-tender, no distention, no masses, no HSM, no guarding, and no rigidity.   Extremities-- no pretibial edema bilaterally Rectal-- No external abnormalities noted. Normal sphincter tone. No rectal masses or tenderness. Brown stool,  Prostate:  Prostate gland firm and smooth, no enlargement, nodularity, tenderness, mass, asymmetry or  induration. Neurologic-- alert & oriented X3  Psych-- Cognition and judgment appear intact. Alert and cooperative with normal attention span and concentration.  not anxious appearing and not depressed appearing.      Assessment & Plan:

## 2012-06-22 LAB — URINE CULTURE
Colony Count: NO GROWTH
Organism ID, Bacteria: NO GROWTH

## 2012-06-23 ENCOUNTER — Encounter: Payer: Self-pay | Admitting: *Deleted

## 2012-09-11 ENCOUNTER — Other Ambulatory Visit: Payer: Self-pay | Admitting: Internal Medicine

## 2012-09-11 NOTE — Telephone Encounter (Signed)
Rx sent pt aware.   MW  

## 2012-12-23 ENCOUNTER — Other Ambulatory Visit: Payer: Self-pay | Admitting: Internal Medicine

## 2012-12-23 NOTE — Telephone Encounter (Signed)
Refill done.  

## 2013-01-03 ENCOUNTER — Other Ambulatory Visit: Payer: Self-pay

## 2013-03-18 ENCOUNTER — Other Ambulatory Visit: Payer: Self-pay | Admitting: Internal Medicine

## 2013-03-18 NOTE — Telephone Encounter (Signed)
Refill done.  

## 2013-05-07 ENCOUNTER — Ambulatory Visit (INDEPENDENT_AMBULATORY_CARE_PROVIDER_SITE_OTHER): Payer: BC Managed Care – PPO | Admitting: Family Medicine

## 2013-05-07 ENCOUNTER — Encounter: Payer: Self-pay | Admitting: Family Medicine

## 2013-05-07 VITALS — BP 146/84 | HR 110 | Temp 101.2°F | Wt 161.6 lb

## 2013-05-07 DIAGNOSIS — A499 Bacterial infection, unspecified: Secondary | ICD-10-CM

## 2013-05-07 DIAGNOSIS — B9689 Other specified bacterial agents as the cause of diseases classified elsewhere: Secondary | ICD-10-CM

## 2013-05-07 DIAGNOSIS — J329 Chronic sinusitis, unspecified: Secondary | ICD-10-CM

## 2013-05-07 DIAGNOSIS — R059 Cough, unspecified: Secondary | ICD-10-CM

## 2013-05-07 DIAGNOSIS — R05 Cough: Secondary | ICD-10-CM

## 2013-05-07 MED ORDER — AMOXICILLIN-POT CLAVULANATE 875-125 MG PO TABS: 1.0000 | ORAL_TABLET | Freq: Two times a day (BID) | ORAL | Status: DC

## 2013-05-07 MED ORDER — GUAIFENESIN-CODEINE 100-10 MG/5ML PO SYRP: ORAL_SOLUTION | ORAL | Status: DC

## 2013-05-07 NOTE — Progress Notes (Signed)
  Subjective:     Adrian Schneider is a 47 y.o. male who presents for evaluation of sinus pain. Symptoms include: congestion, cough, facial pain, fevers, headaches, nasal congestion, post nasal drip, purulent rhinorrhea and sinus pressure. Onset of symptoms was 1 week ago. Symptoms have been gradually worsening since that time. Past history is significant for no history of pneumonia or bronchitis. Patient is a non-smoker.  The following portions of the patient's history were reviewed and updated as appropriate: allergies, current medications, past family history, past medical history, past social history, past surgical history and problem list.  Review of Systems Pertinent items are noted in HPI.   Objective:    BP 146/84  Pulse 110  Temp(Src) 101.2 F (38.4 C) (Oral)  Wt 161 lb 9.6 oz (73.301 kg)  BMI 24.58 kg/m2  SpO2 97% General appearance: alert, cooperative, appears stated age and no distress Ears: normal TM's and external ear canals both ears Nose: green discharge, mild congestion, turbinates red, swollen, sinus tenderness bilateral Throat: lips, mucosa, and tongue normal; teeth and gums normal Neck: mild anterior cervical adenopathy, supple, symmetrical, trachea midline and thyroid not enlarged, symmetric, no tenderness/mass/nodules Lungs: clear to auscultation bilaterally Heart: S1, S2 normal    Assessment:    Acute bacterial sinusitis.    Plan:    Nasal steroids per medication orders. Antihistamines per medication orders. Augmentin per medication orders.  F/u prn

## 2013-05-07 NOTE — Patient Instructions (Signed)

## 2013-06-21 ENCOUNTER — Other Ambulatory Visit: Payer: Self-pay | Admitting: Internal Medicine

## 2013-06-22 NOTE — Telephone Encounter (Signed)
Spoke with patient, made him aware only able to give one month supply of zocor. Scheduled annual exam for 07/17/13

## 2013-06-24 ENCOUNTER — Other Ambulatory Visit: Payer: Self-pay | Admitting: Internal Medicine

## 2013-06-24 NOTE — Telephone Encounter (Signed)
Refill done per protocol. Clarified with RN team lead prior to filling rx refill.

## 2013-07-17 ENCOUNTER — Encounter: Payer: BC Managed Care – PPO | Admitting: Internal Medicine

## 2013-08-05 ENCOUNTER — Other Ambulatory Visit: Payer: Self-pay | Admitting: Internal Medicine

## 2013-08-05 ENCOUNTER — Other Ambulatory Visit: Payer: Self-pay | Admitting: *Deleted

## 2013-08-05 NOTE — Telephone Encounter (Signed)
rx refilled per protocol. DJR  

## 2013-08-05 NOTE — Telephone Encounter (Signed)
Patient has cpe scheduled for 08/24/13. He states he is going out of town tomorrow morning and needs this medication today.

## 2013-08-10 ENCOUNTER — Encounter: Payer: BC Managed Care – PPO | Admitting: Internal Medicine

## 2013-08-21 ENCOUNTER — Telehealth: Payer: Self-pay

## 2013-08-21 NOTE — Telephone Encounter (Signed)
LM for CB HM UTD WE flu vaccine PSA 06/2012 WNL

## 2013-08-24 ENCOUNTER — Ambulatory Visit (INDEPENDENT_AMBULATORY_CARE_PROVIDER_SITE_OTHER): Payer: BC Managed Care – PPO | Admitting: Internal Medicine

## 2013-08-24 ENCOUNTER — Encounter: Payer: Self-pay | Admitting: Internal Medicine

## 2013-08-24 VITALS — BP 154/93 | HR 64 | Temp 98.5°F | Ht 68.2 in | Wt 171.8 lb

## 2013-08-24 DIAGNOSIS — M549 Dorsalgia, unspecified: Secondary | ICD-10-CM

## 2013-08-24 DIAGNOSIS — K649 Unspecified hemorrhoids: Secondary | ICD-10-CM

## 2013-08-24 DIAGNOSIS — Z0000 Encounter for general adult medical examination without abnormal findings: Secondary | ICD-10-CM

## 2013-08-24 DIAGNOSIS — I10 Essential (primary) hypertension: Secondary | ICD-10-CM

## 2013-08-24 LAB — CBC WITH DIFFERENTIAL/PLATELET
Basophils Relative: 0.6 % (ref 0.0–3.0)
Eosinophils Absolute: 0 10*3/uL (ref 0.0–0.7)
Hemoglobin: 14.7 g/dL (ref 13.0–17.0)
Lymphocytes Relative: 25.4 % (ref 12.0–46.0)
MCHC: 33.7 g/dL (ref 30.0–36.0)
Monocytes Relative: 9.1 % (ref 3.0–12.0)
Neutro Abs: 3.2 10*3/uL (ref 1.4–7.7)
Neutrophils Relative %: 64.2 % (ref 43.0–77.0)
RBC: 4.9 Mil/uL (ref 4.22–5.81)
WBC: 5 10*3/uL (ref 4.5–10.5)

## 2013-08-24 LAB — LIPID PANEL
LDL Cholesterol: 101 mg/dL — ABNORMAL HIGH (ref 0–99)
Total CHOL/HDL Ratio: 4
VLDL: 21.8 mg/dL (ref 0.0–40.0)

## 2013-08-24 LAB — COMPREHENSIVE METABOLIC PANEL
AST: 28 U/L (ref 0–37)
Albumin: 4.4 g/dL (ref 3.5–5.2)
Alkaline Phosphatase: 40 U/L (ref 39–117)
Potassium: 3.6 mEq/L (ref 3.5–5.1)
Sodium: 134 mEq/L — ABNORMAL LOW (ref 135–145)
Total Protein: 7.5 g/dL (ref 6.0–8.3)

## 2013-08-24 LAB — HEMOGLOBIN A1C: Hgb A1c MFr Bld: 6 % (ref 4.6–6.5)

## 2013-08-24 LAB — TSH: TSH: 0.82 u[IU]/mL (ref 0.35–5.50)

## 2013-08-24 MED ORDER — DIAZEPAM 5 MG PO TABS: 5.0000 mg | ORAL_TABLET | Freq: Two times a day (BID) | ORAL | Status: DC | PRN

## 2013-08-24 MED ORDER — HYDROCODONE-ACETAMINOPHEN 10-325 MG PO TABS: 1.0000 | ORAL_TABLET | Freq: Four times a day (QID) | ORAL | Status: DC | PRN

## 2013-08-24 NOTE — Assessment & Plan Note (Addendum)
Td 05 Flu shot-- at work Never had a cscope Labs Diet-exercise: discussed!

## 2013-08-24 NOTE — Assessment & Plan Note (Signed)
BP slightly elevated today, usually okay, may be elevated d/t pain. No change, reassess in 4 months when he comes back

## 2013-08-24 NOTE — Telephone Encounter (Signed)
Unable to reach prior to visit  

## 2013-08-24 NOTE — Assessment & Plan Note (Addendum)
Had blood per rectum 6 months ago, since then since then essentially asymptomatic except for occ red blood  drops when whipes sometimes, no FH colon cancer Anoscopy confirmed the presence of internal hemorrhoids. Plan: Refer to colonoscopy at age 47, if he has anemia or increased sx  he will need further eval.

## 2013-08-24 NOTE — Assessment & Plan Note (Signed)
Acute episode of back pain started yesterday, recommend to continue hydrocodone and Valium (he had a leftover), call if not better, RF provided, posture discussed

## 2013-08-24 NOTE — Patient Instructions (Signed)
For pain: --watch your  Posture --Motrin 200 mg 2 tablets every 6 hours as needed for pain. Always take it with food because may cause gastritis and ulcers. If you notice nausea, stomach pain, change in the color of stools --->  Stop the medicine and let us know --hydrocodone as needed if pain continue, will make you sleepy --Diazepam is a muscle relaxant, take as needed, will also cause drowsiness

## 2013-08-24 NOTE — Progress Notes (Signed)
  Subjective:    Patient ID: Adrian Schneider, male    DOB: Aug 02, 1966, 47 y.o.   MRN: [id number]  HPI CPX c/o back pain started  yesterday, not an infrequent occurrence, located at the mid back without radiation. Better w/  hydrocodone- valium ( a leftover medication from previous episode of back pain and anxiety) Hypertension good medication compliance, blood pressure usually 120-130/80  Past Medical History  Diagnosis Date  . MVP (mitral valve prolapse)     dx years ago  . 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 Wart     L foot, non- healing lesion, f/u by derm  . Hypertension 12/2009  . Hyperlipidemia 6/11    started meds  . Hemorrhoids    Past Surgical History  Procedure Laterality Date  . Leg surgery      release muscles d/t "cerebral palsy"   History   Social History  . Marital Status: Married    Spouse Name: N/A    Number of Children: 0  . Years of Education: N/A   Occupational History  . 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  [employer]   Social History Main Topics  . Smoking status: Never Smoker   . Smokeless tobacco: Never Used  . Alcohol Use: Yes     Comment: socially  . Drug Use: No  . Sexual Activity: Not on file   Other Topics Concern  . Not on file   Social History Narrative   Single, no children   Family History  Problem Relation Age of Onset  . Coronary artery disease Mother   . Coronary artery disease Maternal Grandmother   . Hypertension Mother   . Hypertension Father   . Diabetes Father   . Stroke Neg Hx   . Colon cancer Neg Hx   . Prostate cancer Neg Hx      Review of Systems Diet, Exercise-- poor lately, gaining wt No  CP, SOB, lower extremity edema Denies  nausea, vomiting diarrhea 6 months ago saw some blood in the anal area , pt states from hemorrhoids (fresh-red-from the outside, stools normal)  No dysuria, gross hematuria, difficulty urinating   No anxiety, depression       Objective:   Physical Exam BP 154/93  Pulse 64   Temp(Src) 98.5 F (36.9 C)  Ht 5' 8.2" (1.732 m)  Wt 171 lb 12.8 oz (77.928 kg)  BMI 25.98 kg/m2  SpO2 99% General -- alert, well-developed, NAD.  Neck --no thyromegaly  Lungs -- normal respiratory effort, no intercostal retractions, no accessory muscle use, and normal breath sounds.  Heart-- normal rate, regular rhythm, no murmur.  Abdomen-- Not distended, good bowel sounds,soft, non-tender. Rectal-- single skin tag outside. Normal sphincter tone. No rectal masses or tenderness. Brown stool, Hemoccult negative  Prostate--Prostate gland firm and smooth, no enlargement, nodularity Anoscopy --3-4 internal hemorrhoids w/o recent bleed Extremities-- no pretibial edema bilaterally  Neurologic--  alert & oriented X3. Speech normal. gait at baseline Back-- NTTP  Psych-- Cognition and judgment appear intact. Cooperative with normal attention span and concentration. No anxious appearing , no depressed appearing.         Assessment & Plan:

## 2013-09-01 ENCOUNTER — Other Ambulatory Visit: Payer: Self-pay | Admitting: Internal Medicine

## 2013-09-01 NOTE — Telephone Encounter (Signed)
Simvastatin refill sent to pharmacy 

## 2013-09-24 ENCOUNTER — Other Ambulatory Visit: Payer: Self-pay

## 2013-11-18 ENCOUNTER — Other Ambulatory Visit: Payer: Self-pay | Admitting: Internal Medicine

## 2013-11-18 NOTE — Telephone Encounter (Signed)
Losartan refilled per protocol. JG//CMA 

## 2014-01-26 ENCOUNTER — Telehealth: Payer: Self-pay | Admitting: *Deleted

## 2014-01-26 NOTE — Telephone Encounter (Signed)
Patient called inquiring if he should fast for his appointment tomorrow. Patient was instructed to fast for blood work. JG//CMA

## 2014-01-27 ENCOUNTER — Encounter: Payer: Self-pay | Admitting: Internal Medicine

## 2014-01-27 ENCOUNTER — Telehealth: Payer: Self-pay | Admitting: Internal Medicine

## 2014-01-27 ENCOUNTER — Encounter: Payer: Self-pay | Admitting: Lab

## 2014-01-27 ENCOUNTER — Ambulatory Visit (INDEPENDENT_AMBULATORY_CARE_PROVIDER_SITE_OTHER): Payer: BC Managed Care – PPO | Admitting: Internal Medicine

## 2014-01-27 VITALS — BP 115/75 | HR 70 | Temp 98.2°F | Ht 68.2 in | Wt 166.0 lb

## 2014-01-27 DIAGNOSIS — R7309 Other abnormal glucose: Secondary | ICD-10-CM

## 2014-01-27 DIAGNOSIS — R739 Hyperglycemia, unspecified: Secondary | ICD-10-CM | POA: Insufficient documentation

## 2014-01-27 DIAGNOSIS — R7303 Prediabetes: Secondary | ICD-10-CM

## 2014-01-27 DIAGNOSIS — K649 Unspecified hemorrhoids: Secondary | ICD-10-CM

## 2014-01-27 DIAGNOSIS — I10 Essential (primary) hypertension: Secondary | ICD-10-CM

## 2014-01-27 LAB — HEMOGLOBIN A1C: Hgb A1c MFr Bld: 6 % (ref 4.6–6.5)

## 2014-01-27 NOTE — Patient Instructions (Signed)
Get your blood work before you leave   Next visit is for routine check up regards your blood sugar  in 4 months  No need to come back fasting Please make an appointment     If you need more information about a healthy diet, blood sugar, hypertension visit  the American Heart Association, it  is a great resource online at:  http://www.richard-flynn.net/  All about diabetes, great resource!  InsuranceTransaction.co.za.html

## 2014-01-27 NOTE — Telephone Encounter (Signed)
Relevant patient education assigned to patient using Emmi. ° °

## 2014-01-27 NOTE — Assessment & Plan Note (Signed)
No further symptoms, did not have a colonoscopy

## 2014-01-27 NOTE — Assessment & Plan Note (Signed)
A1c Few months ago was 6.0, we had a extensive discussion about diet, concept of A1c, role of exercise. Plans to increase his physical activity and start to eat better. lans

## 2014-01-27 NOTE — Progress Notes (Signed)
   Subjective:    Patient ID: Adrian Schneider, male    DOB: Jun 23, 1966, 48 y.o.   MRN: [id number]  DOS:  01/27/2014 Type of  visit: Followup from previous visit  A1c showed prediabetes, no improvement on his diet and exercise. High blood pressure, good medication compliance, not ambulatory BPs. Also he developed knee pain, right side, on and off , severe. Went to the orthopedic Dr., had a  local injections and use a brace for few days. Feeling better.     ROS No further BPR  Past Medical History  Diagnosis Date  . MVP (mitral valve prolapse)     dx years ago  . 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 Wart     L foot, non- healing lesion, f/u by derm  . Hypertension 12/2009  . Hyperlipidemia 6/11    started meds  . Hemorrhoids     Past Surgical History  Procedure Laterality Date  . Leg surgery      release muscles d/t "cerebral palsy"    History   Social History  . Marital Status: Married    Spouse Name: N/A    Number of Children: 0  . Years of Education: N/A   Occupational History  . 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  [employer]   Social History Main Topics  . Smoking status: Never Smoker   . Smokeless tobacco: Never Used  . Alcohol Use: Yes     Comment: socially  . Drug Use: No  . Sexual Activity: Not on file   Other Topics Concern  . Not on file   Social History Narrative   Single, no children        Medication List       This list is accurate as of: 01/27/14  5:54 PM.  Always use your most recent med list.               losartan 50 MG tablet  Commonly known as:  COZAAR  TAKE 1 TABLET (50 MG TOTAL) BY MOUTH DAILY.     simvastatin 40 MG tablet  Commonly known as:  ZOCOR  Take 1 tablet every evening           Objective:   Physical Exam BP 115/75  Pulse 70  Temp(Src) 98.2 F (36.8 C)  Ht 5' 8.2" (1.732 m)  Wt 166 lb (75.297 kg)  BMI 25.10 kg/m2  SpO2 96% General -- alert, well-developed, NAD.   Psych-- Cognition and judgment  appear intact. Cooperative with normal attention span and concentration. No anxious or depressed appearing.     Assessment & Plan:   Today , I spent more than  min with the patient, >50% of the time counseling regards blood sugar and diet

## 2014-01-27 NOTE — Assessment & Plan Note (Signed)
Well-controlled, no change 

## 2014-01-27 NOTE — Progress Notes (Signed)
Pre visit review using our clinic review tool, if applicable. No additional management support is needed unless otherwise documented below in the visit note. 

## 2014-03-18 ENCOUNTER — Other Ambulatory Visit: Payer: Self-pay | Admitting: Internal Medicine

## 2014-05-16 ENCOUNTER — Other Ambulatory Visit: Payer: Self-pay | Admitting: Internal Medicine

## 2014-07-17 ENCOUNTER — Other Ambulatory Visit: Payer: Self-pay | Admitting: Internal Medicine

## 2014-10-18 ENCOUNTER — Other Ambulatory Visit: Payer: Self-pay | Admitting: Internal Medicine

## 2014-10-19 ENCOUNTER — Other Ambulatory Visit: Payer: Self-pay | Admitting: Internal Medicine

## 2014-11-08 ENCOUNTER — Ambulatory Visit (INDEPENDENT_AMBULATORY_CARE_PROVIDER_SITE_OTHER): Payer: BC Managed Care – PPO | Admitting: Internal Medicine

## 2014-11-08 ENCOUNTER — Encounter: Payer: Self-pay | Admitting: Internal Medicine

## 2014-11-08 VITALS — BP 132/74 | HR 96 | Temp 97.5°F | Wt 175.5 lb

## 2014-11-08 DIAGNOSIS — J209 Acute bronchitis, unspecified: Secondary | ICD-10-CM

## 2014-11-08 MED ORDER — HYDROCODONE-HOMATROPINE 5-1.5 MG/5ML PO SYRP: 5.0000 mL | ORAL_SOLUTION | Freq: Two times a day (BID) | ORAL | Status: DC | PRN

## 2014-11-08 MED ORDER — AZITHROMYCIN 250 MG PO TABS: ORAL_TABLET | ORAL | Status: DC

## 2014-11-08 NOTE — Progress Notes (Signed)
Subjective:    Patient ID: Adrian Schneider, male    DOB: Aug 09, 1966, 48 y.o.   MRN: [id number]  DOS:  11/08/2014 Type of visit - description : acute Interval history: Become sick 4-5 days ago with chest congestion, green sputum production, cough is worse at night and when he talks, it has been unable to sleep well. Develop a temperature of 100.9 this morning. Has been taking OTCs with little relief.    ROS  mild pain and sinus congestion and clear nasal discharge No nausea, vomiting, diarrhea or abdominal pain, his a stools were slightly darker than usual last week but they are back to normal this week. Denies hemoptysis, no generalized aches or pains.  Past Medical History  Diagnosis Date  . MVP (mitral valve prolapse)     dx years ago  . 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 Wart     L foot, non- healing lesion, f/u by derm  . Hypertension 12/2009  . Hyperlipidemia 6/11    started meds  . Hemorrhoids     Past Surgical History  Procedure Laterality Date  . Leg surgery      release muscles d/t "cerebral palsy"    History   Social History  . Marital Status: Married    Spouse Name: N/A    Number of Children: 0  . Years of Education: N/A   Occupational History  . 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  [employer]   Social History Main Topics  . Smoking status: Never Smoker   . Smokeless tobacco: Never Used  . Alcohol Use: Yes     Comment: socially  . Drug Use: No  . Sexual Activity: Not on file   Other Topics Concern  . Not on file   Social History Narrative   Single, no children        Medication List       This list is accurate as of: 11/08/14  6:26 PM.  Always use your most recent med list.               azithromycin 250 MG tablet  Commonly known as:  ZITHROMAX Z-PAK  2 tabs a day the first day, then 1 tab a day x 4 days     HYDROcodone-homatropine 5-1.5 MG/5ML syrup  Commonly known as:  HYCODAN  Take 5 mLs by mouth 2 (two) times daily as  needed for cough.     losartan 50 MG tablet  Commonly known as:  COZAAR  TAKE 1 TABLET (50 MG TOTAL) BY MOUTH DAILY.     simvastatin 40 MG tablet  Commonly known as:  ZOCOR  Take 1 tablet daily. DUE FOR APPT WITH DR Memorie Yokoyama. [phone].           Objective:   Physical Exam BP 132/74 mmHg  Pulse 96  Temp(Src) 97.5 F (36.4 C) (Oral)  Wt 175 lb 8 oz (79.606 kg)  SpO2 95% General -- alert, well-developed, NAD.  HEENT-- Not pale.  R Ear-- normal L ear-- normal Throat symmetric, no redness or discharge. Face symmetric, sinuses not tender to palpation. Nose quite congested. Lungs -- normal respiratory effort, no intercostal retractions, no accessory muscle use, and few rhonchi with cough otherwise normal.  Heart-- normal rate, regular rhythm, no murmur.   Neurologic--  alert & oriented X3. Speech normal, gait appropriate for age, strength symmetric and appropriate for age.  Psych-- Cognition and judgment appear  intact. Cooperative with normal attention span and concentration. No anxious or depressed appearing.     Assessment & Plan:   Bronchitis, Symptoms most likely due to bronchitis, see instructions. Encourage to come back for a physical

## 2014-11-08 NOTE — Progress Notes (Signed)
Pre visit review using our clinic review tool, if applicable. No additional management support is needed unless otherwise documented below in the visit note. 

## 2014-11-08 NOTE — Patient Instructions (Signed)
Rest, fluids , tylenol If  cough, take Mucinex DM twice a day as needed  If the cough is severe, use hydrocodone, will cause drowsiness. If nasal  congestion use OTC Nasocort or Flonase : 2 nasal sprays on each side of the nose daily until you feel better  Take the antibiotic as prescribed  (zithromax) Call if not gradually better over the next  10 days Call anytime if the symptoms are severe  Please schedule a complete physical, fasting, at your convenience

## 2014-11-19 HISTORY — PX: COLONOSCOPY: SHX174

## 2015-01-17 ENCOUNTER — Other Ambulatory Visit: Payer: Self-pay | Admitting: Internal Medicine

## 2015-01-20 ENCOUNTER — Other Ambulatory Visit: Payer: Self-pay | Admitting: Internal Medicine

## 2015-03-17 ENCOUNTER — Other Ambulatory Visit: Payer: Self-pay

## 2015-03-18 ENCOUNTER — Ambulatory Visit (INDEPENDENT_AMBULATORY_CARE_PROVIDER_SITE_OTHER): Payer: BLUE CROSS/BLUE SHIELD | Admitting: Internal Medicine

## 2015-03-18 ENCOUNTER — Encounter: Payer: Self-pay | Admitting: Internal Medicine

## 2015-03-18 ENCOUNTER — Other Ambulatory Visit: Payer: Self-pay | Admitting: Internal Medicine

## 2015-03-18 VITALS — BP 128/84 | HR 64 | Temp 97.8°F | Ht 68.0 in | Wt 174.5 lb

## 2015-03-18 DIAGNOSIS — Z23 Encounter for immunization: Secondary | ICD-10-CM | POA: Diagnosis not present

## 2015-03-18 DIAGNOSIS — R7303 Prediabetes: Secondary | ICD-10-CM

## 2015-03-18 DIAGNOSIS — M79672 Pain in left foot: Secondary | ICD-10-CM | POA: Diagnosis not present

## 2015-03-18 DIAGNOSIS — Z114 Encounter for screening for human immunodeficiency virus [HIV]: Secondary | ICD-10-CM

## 2015-03-18 DIAGNOSIS — R7309 Other abnormal glucose: Secondary | ICD-10-CM | POA: Diagnosis not present

## 2015-03-18 DIAGNOSIS — Z0000 Encounter for general adult medical examination without abnormal findings: Secondary | ICD-10-CM | POA: Diagnosis not present

## 2015-03-18 DIAGNOSIS — Z125 Encounter for screening for malignant neoplasm of prostate: Secondary | ICD-10-CM | POA: Diagnosis not present

## 2015-03-18 LAB — CBC WITH DIFFERENTIAL/PLATELET
BASOS ABS: 0 10*3/uL (ref 0.0–0.1)
BASOS PCT: 0.4 % (ref 0.0–3.0)
EOS ABS: 0.1 10*3/uL (ref 0.0–0.7)
EOS PCT: 1.4 % (ref 0.0–5.0)
HEMATOCRIT: 43.7 % (ref 39.0–52.0)
Hemoglobin: 15 g/dL (ref 13.0–17.0)
LYMPHS ABS: 1.4 10*3/uL (ref 0.7–4.0)
LYMPHS PCT: 26.2 % (ref 12.0–46.0)
MCHC: 34.4 g/dL (ref 30.0–36.0)
MCV: 87.9 fl (ref 78.0–100.0)
MONO ABS: 0.5 10*3/uL (ref 0.1–1.0)
MONOS PCT: 9.3 % (ref 3.0–12.0)
Neutro Abs: 3.4 10*3/uL (ref 1.4–7.7)
Neutrophils Relative %: 62.7 % (ref 43.0–77.0)
Platelets: 304 10*3/uL (ref 150.0–400.0)
RBC: 4.98 Mil/uL (ref 4.22–5.81)
RDW: 13.7 % (ref 11.5–15.5)
WBC: 5.4 10*3/uL (ref 4.0–10.5)

## 2015-03-18 LAB — COMPREHENSIVE METABOLIC PANEL
ALT: 18 U/L (ref 0–53)
AST: 23 U/L (ref 0–37)
Albumin: 4.1 g/dL (ref 3.5–5.2)
Alkaline Phosphatase: 57 U/L (ref 39–117)
BILIRUBIN TOTAL: 0.6 mg/dL (ref 0.2–1.2)
BUN: 10 mg/dL (ref 6–23)
CHLORIDE: 104 meq/L (ref 96–112)
CO2: 28 mEq/L (ref 19–32)
Calcium: 9.1 mg/dL (ref 8.4–10.5)
Creatinine, Ser: 0.84 mg/dL (ref 0.40–1.50)
GFR: 102.3 mL/min (ref >60.00)
GLUCOSE: 99 mg/dL (ref 70–99)
Potassium: 4.1 mEq/L (ref 3.5–5.1)
Sodium: 138 mEq/L (ref 135–145)
Total Protein: 6.7 g/dL (ref 6.0–8.3)

## 2015-03-18 LAB — PSA: PSA: 1.08 ng/mL (ref 0.10–4.00)

## 2015-03-18 LAB — HEMOGLOBIN A1C: Hgb A1c MFr Bld: 5.8 % (ref 4.6–6.5)

## 2015-03-18 MED ORDER — SIMVASTATIN 40 MG PO TABS: 40.0000 mg | ORAL_TABLET | Freq: Every day | ORAL | Status: DC

## 2015-03-18 MED ORDER — BETAMETHASONE DIPROPIONATE AUG 0.05 % EX CREA: TOPICAL_CREAM | Freq: Two times a day (BID) | CUTANEOUS | Status: DC | PRN

## 2015-03-18 MED ORDER — LOSARTAN POTASSIUM 50 MG PO TABS: 50.0000 mg | ORAL_TABLET | Freq: Every day | ORAL | Status: DC

## 2015-03-18 NOTE — Assessment & Plan Note (Addendum)
Td 02-2015 Declined the pneumonia shot  Prostate cancer screening, DRE today normal, check a PSA Colon cancer screening, we discussed colonoscopy versus Hemoccults. Elected a cscope. Referral sent   Labs CMP, CBC, A1c, PSA, HIV Diet-exercise: discussed    Other issues: Hand pain, some DJD? Recommend observation for now Left foot pain, likely related to cerebral palsy, recommend to see orthopedic surgery or  rehabilitation medicine. Eczema? Has severe episodes of itching at the base of the penis and anal area,   exam is consistent with eczema at the penis, recommend trial with diprolene as needed. Prediabetes, check A1c Hypertension, no ambulatory BPs, BP today is very good, refill medications, come back in 6 months

## 2015-03-18 NOTE — Progress Notes (Signed)
Subjective:    Patient ID: Adrian Schneider, male    DOB: January 09, 1966, 49 y.o.   MRN: [id number]  DOS:  03/18/2015 Type of visit - description : cpx Interval history: In general feeling well except for a few symptoms, see review of systems    Review of Systems Constitutional: No fever, chills. No unexplained wt changes. No unusual sweats HEENT: No dental problems, ear discharge, facial swelling, voice changes. No eye discharge, redness or intolerance to light Respiratory: No wheezing or difficulty breathing. No cough , mucus production Cardiovascular: No CP, leg swelling or palpitations GI: no nausea, vomiting, diarrhea or abdominal pain.  No blood in the stools. No dysphagia   Endocrine: No polyphagia, polyuria or polydipsia GU: No dysuria, gross hematuria, difficulty urinating. No urinary urgency or frequency. Musculoskeletal: No joint swellings but reports occasional pain at the base of both thumbs for the last couple of years, pain is on and off. Also for a while is having sharp, intense, short-lived  pain at the dorsum of the L foot mostly with ambulation. Skin: No change in the color of the skin, palor or rash but reports extreme itching at the perianal area and base of the penis Allergic, immunologic: No environmental allergies or food allergies Neurological: No dizziness or syncope. No headaches. No diplopia, slurred speech, motor deficits, facial numbness Hematological: No enlarged lymph nodes, easy bruising or bleeding Psychiatry: No suicidal ideas, hallucinations, behavior problems or confusion. No unusual/severe anxiety or depression.     Past Medical History  Diagnosis Date  . MVP (mitral valve prolapse)     dx years ago  . 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 Wart     L foot, non- healing lesion, f/u by derm  . Hypertension 12/2009  . Hyperlipidemia 6/11    started meds  . Hemorrhoids     Past Surgical History  Procedure Laterality Date  . Leg  surgery      release muscles d/t "cerebral palsy"    History   Social History  . Marital Status: Married    Spouse Name: N/A  . Number of Children: 0  . Years of Education: N/A   Occupational History  . 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  [employer]   Social History Main Topics  . Smoking status: Never Smoker   . Smokeless tobacco: Never Used  . Alcohol Use: Yes     Comment: socially  . Drug Use: No  . Sexual Activity: Not on file   Other Topics Concern  . Not on file   Social History Narrative   Single, no children     Family History  Problem Relation Age of Onset  . Coronary artery disease Mother   . Coronary artery disease Maternal Grandmother   . Hypertension Mother   . Hypertension Father   . Diabetes Father   . Stroke Neg Hx   . Colon cancer Neg Hx   . Prostate cancer Neg Hx       Medication List       This list is accurate as of: 03/18/15 11:59 PM.  Always use your most recent med list.               augmented betamethasone dipropionate 0.05 % cream  Commonly known as:  DIPROLENE-AF  Apply topically 2 (two) times daily as needed.     losartan 50 MG tablet  Commonly known as:  COZAAR  Take 1 tablet (50 mg total) by mouth daily.     simvastatin 40 MG tablet  Commonly known as:  ZOCOR  Take 1 tablet (40 mg total) by mouth daily.           Objective:   Physical Exam BP 128/84 mmHg  Pulse 64  Temp(Src) 97.8 F (36.6 C) (Oral)  Ht 5\' 8"  (1.727 m)  Wt 174 lb 8 oz (79.153 kg)  BMI 26.54 kg/m2  SpO2 98% General:   Well developed, well nourished . NAD.  Neck:  Full range of motion. Supple. No  thyromegaly , normal carotid pulse HEENT:  Normocephalic . Face symmetric, atraumatic Lungs:  CTA B Normal respiratory effort, no intercostal retractions, no accessory muscle use. Heart: RRR,  no murmur.  Abdomen:  Not distended, soft, non-tender. No rebound or rigidity. No mass,organomegaly Muscle skeletal: no pretibial edema bilaterally  Rectal:    External abnormalities: none. Normal sphincter tone. No rectal masses or tenderness.  Stool brown  Prostate: Prostate gland firm and smooth, no enlargement, nodularity, tenderness, mass, asymmetry or induration.  Skin:  Perianal skin normal to inspection, at the base of the penis there is a very small area of scaliness and mild redness. Neurologic:  alert & oriented X3.  Speech normal, gait at baseline.  Psych: Cognition and judgment appear intact.  Cooperative with normal attention span and concentration.  Behavior appropriate. No anxious or depressed appearing.        Assessment & Plan:

## 2015-03-18 NOTE — Progress Notes (Signed)
Pre visit review using our clinic review tool, if applicable. No additional management support is needed unless otherwise documented below in the visit note. 

## 2015-03-18 NOTE — Patient Instructions (Signed)
Get your blood work before you leave   Check the  blood pressure  weekly  Be sure your blood pressure is between 110/65 and  145/85.  if it is consistently higher or lower, let me know      Come back to the office in 6 months   for a routine check up

## 2015-03-19 LAB — HIV ANTIBODY (ROUTINE TESTING W REFLEX): HIV: NONREACTIVE

## 2015-04-14 ENCOUNTER — Encounter: Payer: Self-pay | Admitting: Internal Medicine

## 2015-04-16 ENCOUNTER — Other Ambulatory Visit: Payer: Self-pay | Admitting: Internal Medicine

## 2015-04-19 NOTE — Telephone Encounter (Signed)
Pt is requesting refill on augmented betamethasone dipropionate. Last fill on 03/18/2015 #60g 0RF. Okay to refill?

## 2015-04-19 NOTE — Telephone Encounter (Signed)
Ok 1 and 1 RF 

## 2015-05-22 ENCOUNTER — Emergency Department (HOSPITAL_BASED_OUTPATIENT_CLINIC_OR_DEPARTMENT_OTHER)
Admission: EM | Admit: 2015-05-22 | Discharge: 2015-05-22 | Disposition: A | Discharge status: Home / Self Care | Payer: BLUE CROSS/BLUE SHIELD | Attending: Emergency Medicine | Admitting: Emergency Medicine

## 2015-05-22 ENCOUNTER — Encounter (HOSPITAL_BASED_OUTPATIENT_CLINIC_OR_DEPARTMENT_OTHER): Payer: Self-pay | Admitting: *Deleted

## 2015-05-22 DIAGNOSIS — S300XXA Contusion of lower back and pelvis, initial encounter: Secondary | ICD-10-CM | POA: Insufficient documentation

## 2015-05-22 DIAGNOSIS — S301XXA Contusion of abdominal wall, initial encounter: Secondary | ICD-10-CM | POA: Diagnosis not present

## 2015-05-22 DIAGNOSIS — E785 Hyperlipidemia, unspecified: Secondary | ICD-10-CM | POA: Diagnosis not present

## 2015-05-22 DIAGNOSIS — Y998 Other external cause status: Secondary | ICD-10-CM | POA: Insufficient documentation

## 2015-05-22 DIAGNOSIS — Z8669 Personal history of other diseases of the nervous system and sense organs: Secondary | ICD-10-CM | POA: Diagnosis not present

## 2015-05-22 DIAGNOSIS — Y92090 Kitchen in other non-institutional residence as the place of occurrence of the external cause: Secondary | ICD-10-CM | POA: Diagnosis not present

## 2015-05-22 DIAGNOSIS — M62830 Muscle spasm of back: Secondary | ICD-10-CM | POA: Diagnosis not present

## 2015-05-22 DIAGNOSIS — Z8619 Personal history of other infectious and parasitic diseases: Secondary | ICD-10-CM | POA: Insufficient documentation

## 2015-05-22 DIAGNOSIS — S4991XA Unspecified injury of right shoulder and upper arm, initial encounter: Secondary | ICD-10-CM | POA: Diagnosis not present

## 2015-05-22 DIAGNOSIS — S59901A Unspecified injury of right elbow, initial encounter: Secondary | ICD-10-CM | POA: Diagnosis not present

## 2015-05-22 DIAGNOSIS — Y9389 Activity, other specified: Secondary | ICD-10-CM | POA: Diagnosis not present

## 2015-05-22 DIAGNOSIS — I10 Essential (primary) hypertension: Secondary | ICD-10-CM | POA: Diagnosis not present

## 2015-05-22 DIAGNOSIS — W1839XA Other fall on same level, initial encounter: Secondary | ICD-10-CM | POA: Diagnosis not present

## 2015-05-22 DIAGNOSIS — Z79899 Other long term (current) drug therapy: Secondary | ICD-10-CM | POA: Diagnosis not present

## 2015-05-22 DIAGNOSIS — S3991XA Unspecified injury of abdomen, initial encounter: Secondary | ICD-10-CM | POA: Diagnosis present

## 2015-05-22 MED ORDER — NAPROXEN 500 MG PO TABS: 500.0000 mg | ORAL_TABLET | Freq: Two times a day (BID) | ORAL | Status: DC

## 2015-05-22 MED ORDER — ORPHENADRINE CITRATE ER 100 MG PO TB12: 100.0000 mg | ORAL_TABLET | Freq: Two times a day (BID) | ORAL | Status: DC

## 2015-05-22 MED ORDER — HYDROCODONE-ACETAMINOPHEN 5-325 MG PO TABS: 1.0000 | ORAL_TABLET | ORAL | Status: DC | PRN

## 2015-05-22 NOTE — ED Notes (Signed)
Pt refused wheelchair, stating it hurts worse to sit down.

## 2015-05-22 NOTE — ED Notes (Signed)
Pt reports falling into the [employer] yesterday.  Noted to have a large bruise and swelling on his right flank with muscle spasms

## 2015-05-22 NOTE — ED Provider Notes (Signed)
CSN: [id number]     Arrival date & time 05/22/15  1457 History  This chart was scribed for  Charlesetta Shanks, MD by Altamease Oiler, ED Scribe. This patient was seen in room MH01/MH01 and the patient's care was started at 3:20 PM.  Chief Complaint  Patient presents with  . Spasms    The history is provided by the patient. No language interpreter was used.   Adrian Schneider is a 49 y.o. male with PMHx of cerebral palsy who presents to the Emergency Department complaining of constant spasming pain in the right flank with sudden onset around 5:30 PM yesterday after falling into a kitchen counter.  Pt states that his cerebral palsy causes him to drag his feet and occasionally trip and fall. The pain is exacerbated by movement and rated 6/10 at worst. After falling he iced the area and took ibuprofen with insufficient pain relief. Associated symptoms include bruising, pain in the right shoulder, and pain in the right elbow. Pt denies head injury, LOC, new extremity weakness or numbness, and hematuria.  Past Medical History  Diagnosis Date  . MVP (mitral valve prolapse)     dx years ago  . 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 Wart     L foot, non- healing lesion, f/u by derm  . Hypertension 12/2009  . Hyperlipidemia 6/11    started meds  . Hemorrhoids    Past Surgical History  Procedure Laterality Date  . Leg surgery      release muscles d/t "cerebral palsy"   Family History  Problem Relation Age of Onset  . Coronary artery disease Mother   . Coronary artery disease Maternal Grandmother   . Hypertension Mother   . Hypertension Father   . Diabetes Father   . Stroke Neg Hx   . Colon cancer Neg Hx   . Prostate cancer Neg Hx    History  Substance Use Topics  . Smoking status: Never Smoker   . Smokeless tobacco: Never Used  . Alcohol Use: Yes     Comment: socially    Review of Systems 10 Systems reviewed and all are negative for acute change except as noted in the  HPI.  Allergies  Mold extract  Home Medications   Prior to Admission medications   Medication Sig Start Date End Date Taking? Authorizing Provider  HYDROcodone-acetaminophen (NORCO/VICODIN) 5-325 MG per tablet Take 1-2 tablets by mouth every 4 (four) hours as needed for moderate pain or severe pain. 05/22/15   Charlesetta Shanks, MD  losartan (COZAAR) 50 MG tablet Take 1 tablet (50 mg total) by mouth daily. 03/18/15   Colon Branch, MD  naproxen (NAPROSYN) 500 MG tablet Take 1 tablet (500 mg total) by mouth 2 (two) times daily. 05/22/15   Charlesetta Shanks, MD  orphenadrine (NORFLEX) 100 MG tablet Take 1 tablet (100 mg total) by mouth 2 (two) times daily. 05/22/15   Charlesetta Shanks, MD  simvastatin (ZOCOR) 40 MG tablet Take 1 tablet (40 mg total) by mouth daily. 03/18/15   Colon Branch, MD   Triage Vitals: BP 158/99 mmHg  Pulse 82  Temp(Src) 98.5 F (36.9 C) (Oral)  Resp 18  Ht 5\' 8"  (1.727 m)  Wt 176 lb (79.833 kg)  BMI 26.77 kg/m2  SpO2 100% Physical Exam  Constitutional: He is oriented to person, place, and time. He appears well-developed and well-nourished. No distress.  HENT:  Head: Normocephalic and atraumatic.  Eyes: EOM  are normal.  Pulmonary/Chest: Effort normal. He exhibits no tenderness.  Abdominal: Soft. He exhibits no distension. There is no tenderness. There is no guarding.  No CVA tenderness.  Musculoskeletal:  Contusion to the right lower back over the iliac crest region. There is a very fine linear abrasion, 10 cm. Surrounding this there is bruising and hematoma formation approximately 10 x 6 cm oval. Some erythema extends beyond this to approximately 15 cm diameter. The area is tender but not indurated. The bony prominences of the spine are nontender. The greater trochanter is nontender to compression. The patient can perform range of motion with flexion and extension and internal/external rotation at the right hip without significant pain. There is no swelling or radiation of pain into  the right lower extremity. No effusion at the knee or the ankle. Patient is neurologically intact for flexion and extension.  Neurological: He is alert and oriented to person, place, and time. He exhibits normal muscle tone.  Skin: Skin is warm and dry.  Psychiatric: He has a normal mood and affect.    ED Course  Procedures   DIAGNOSTIC STUDIES: Oxygen Saturation is 100% on RA, normal by my interpretation.    COORDINATION OF CARE: 3:26 PM Discussed treatment plan which includes outpatient pain management with naproxen, Vicodin, and Norflex with pt at bedside and pt agreed to plan.  Labs Review Labs Reviewed - No data to display  Imaging Review No results found.   EKG Interpretation None      MDM   Final diagnoses:  Contusion, flank, initial encounter  Muscle spasm of back   The patient fell approximately 24 hours ago. He has been experiencing muscle spasms in the lower back. He has not had any neurologic dysfunction of the lower extremity. He does a baseline has cerebral palsy with some frequency of falls. He however denies any increase in his weakness or incoordination from baseline. Pain is exacerbated by walking and movement. However was physical examination there is no pain localizing into the hip itself. At this time findings are most consistent with a deep contusion of the soft tissues of the lower back. There is no CVA tenderness or any observed hematuria to suggest renal injury. The abdomen is completely soft and nontender. Patient be treated with naproxen twice daily, Norflex twice daily, Vicodin as needed.   Charlesetta Shanks, MD 05/22/15 [phone]

## 2015-05-22 NOTE — Discharge Instructions (Signed)
Contusion A contusion is a deep bruise. Contusions are the result of an injury that caused bleeding under the skin. The contusion may turn blue, purple, or yellow. Minor injuries will give you a painless contusion, but more severe contusions may stay painful and swollen for a few weeks.  CAUSES  A contusion is usually caused by a blow, trauma, or direct force to an area of the body. SYMPTOMS   Swelling and redness of the injured area.  Bruising of the injured area.  Tenderness and soreness of the injured area.  Pain. DIAGNOSIS  The diagnosis can be made by taking a history and physical exam. An X-ray, CT scan, or MRI may be needed to determine if there were any associated injuries, such as fractures. TREATMENT  Specific treatment will depend on what area of the body was injured. In general, the best treatment for a contusion is resting, icing, elevating, and applying cold compresses to the injured area. Over-the-counter medicines may also be recommended for pain control. Ask your caregiver what the best treatment is for your contusion. HOME CARE INSTRUCTIONS   Put ice on the injured area.  Put ice in a plastic bag.  Place a towel between your skin and the bag.  Leave the ice on for 15-20 minutes, 3-4 times a day, or as directed by your health care provider.  Only take over-the-counter or prescription medicines for pain, discomfort, or fever as directed by your caregiver. Your caregiver may recommend avoiding anti-inflammatory medicines (aspirin, ibuprofen, and naproxen) for 48 hours because these medicines may increase bruising.  Rest the injured area.  If possible, elevate the injured area to reduce swelling. SEEK IMMEDIATE MEDICAL CARE IF:   You have increased bruising or swelling.  You have pain that is getting worse.  Your swelling or pain is not relieved with medicines. MAKE SURE YOU:   Understand these instructions.  Will watch your condition.  Will get help right  away if you are not doing well or get worse. Document Released: 08/15/2005 Document Revised: 11/10/2013 Document Reviewed: 09/10/2011 [facility] Patient Information 2015 [place], [place]. This information is not intended to replace advice given to you by your health care provider. Make sure you discuss any questions you have with your health care provider. Muscle Cramps and Spasms Muscle cramps and spasms occur when a muscle or muscles tighten and you have no control over this tightening (involuntary muscle contraction). They are a common problem and can develop in any muscle. The most common place is in the calf muscles of the leg. Both muscle cramps and muscle spasms are involuntary muscle contractions, but they also have differences:   Muscle cramps are sporadic and painful. They may last a few seconds to a quarter of an hour. Muscle cramps are often more forceful and last longer than muscle spasms.  Muscle spasms may or may not be painful. They may also last just a few seconds or much longer. CAUSES  It is uncommon for cramps or spasms to be due to a serious underlying problem. In many cases, the cause of cramps or spasms is unknown. Some common causes are:   Overexertion.   Overuse from repetitive motions (doing the same thing over and over).   Remaining in a certain position for a long period of time.   Improper preparation, form, or technique while performing a sport or activity.   Dehydration.   Injury.   Side effects of some medicines.   Abnormally low levels of the salts and  ions in your blood (electrolytes), especially potassium and calcium. This could happen if you are taking water pills (diuretics) or you are pregnant.  Some underlying medical problems can make it more likely to develop cramps or spasms. These include, but are not limited to:   Diabetes.   Parkinson disease.   Hormone disorders, such as thyroid problems.   Alcohol abuse.   Diseases specific  to muscles, joints, and bones.   Blood vessel disease where not enough blood is getting to the muscles.  HOME CARE INSTRUCTIONS   Stay well hydrated. Drink enough water and fluids to keep your urine clear or pale yellow.  It may be helpful to massage, stretch, and relax the affected muscle.  For tight or tense muscles, use a warm towel, heating pad, or hot shower water directed to the affected area.  If you are sore or have pain after a cramp or spasm, applying ice to the affected area may relieve discomfort.  Put ice in a plastic bag.  Place a towel between your skin and the bag.  Leave the ice on for 15-20 minutes, 03-04 times a day.  Medicines used to treat a known cause of cramps or spasms may help reduce their frequency or severity. Only take over-the-counter or prescription medicines as directed by your caregiver. SEEK MEDICAL CARE IF:  Your cramps or spasms get more severe, more frequent, or do not improve over time.  MAKE SURE YOU:   Understand these instructions.  Will watch your condition.  Will get help right away if you are not doing well or get worse. Document Released: 04/27/2002 Document Revised: 03/02/2013 Document Reviewed: 10/22/2012 [facility] Patient Information 2015 [place], [place]. This information is not intended to replace advice given to you by your health care provider. Make sure you discuss any questions you have with your health care provider.

## 2015-06-24 ENCOUNTER — Ambulatory Visit (AMBULATORY_SURGERY_CENTER): Payer: Self-pay

## 2015-06-24 VITALS — Ht 68.0 in | Wt 174.8 lb

## 2015-06-24 DIAGNOSIS — Z1211 Encounter for screening for malignant neoplasm of colon: Secondary | ICD-10-CM

## 2015-06-24 MED ORDER — MOVIPREP 100 G PO SOLR: 1.0000 | Freq: Once | ORAL | Status: DC

## 2015-06-24 NOTE — Progress Notes (Signed)
No allergies to eggs or soy No past problems with anesthesia No diet/weight loss meds No home oxygen  Has email  Emmi instructions given colonoscopy

## 2015-07-08 ENCOUNTER — Encounter: Payer: Self-pay | Admitting: Internal Medicine

## 2015-07-08 ENCOUNTER — Ambulatory Visit (AMBULATORY_SURGERY_CENTER): Payer: BLUE CROSS/BLUE SHIELD | Admitting: Internal Medicine

## 2015-07-08 VITALS — BP 101/59 | HR 75 | Temp 97.4°F | Resp 21 | Ht 68.0 in | Wt 174.0 lb

## 2015-07-08 DIAGNOSIS — K621 Rectal polyp: Secondary | ICD-10-CM

## 2015-07-08 DIAGNOSIS — D120 Benign neoplasm of cecum: Secondary | ICD-10-CM

## 2015-07-08 DIAGNOSIS — Z1211 Encounter for screening for malignant neoplasm of colon: Secondary | ICD-10-CM | POA: Diagnosis present

## 2015-07-08 DIAGNOSIS — D128 Benign neoplasm of rectum: Secondary | ICD-10-CM

## 2015-07-08 DIAGNOSIS — D123 Benign neoplasm of transverse colon: Secondary | ICD-10-CM | POA: Diagnosis not present

## 2015-07-08 DIAGNOSIS — D129 Benign neoplasm of anus and anal canal: Secondary | ICD-10-CM

## 2015-07-08 MED ORDER — SODIUM CHLORIDE 0.9 % IV SOLN: 500.0000 mL | INTRAVENOUS | Status: DC

## 2015-07-08 NOTE — Progress Notes (Signed)
Transferred to recovery room. A/O x3, pleased with MAC.  VSS.  Report to Celia, RN. 

## 2015-07-08 NOTE — Op Note (Signed)
[facility]  [address] [place], [address]   COLONOSCOPY PROCEDURE REPORT  PATIENT: Adrian Schneider, Adrian Schneider  MR#: [id number] BIRTHDATE: 05-27-1966 , 49  yrs. old GENDER: male ENDOSCOPIST: Eustace Quail, MD REFERRED YT:RZNB Larose Kells, M.D. PROCEDURE DATE:  07/08/2015 PROCEDURE:   Colonoscopy, screening and Colonoscopy with snare polypectomy x 3 First Screening Colonoscopy - Avg.  risk and is 50 yrs.  old or older Yes.  Prior Negative Screening - Now for repeat screening. N/A  History of Adenoma - Now for follow-up colonoscopy & has been > or = to 3 yrs.  N/A  Polyps removed today? Yes ASA CLASS:   Class II INDICATIONS:Screening for colonic neoplasia and Colorectal Neoplasm Risk Assessment for this procedure is average risk. MEDICATIONS: Monitored anesthesia care and Propofol 250 mg IV  DESCRIPTION OF PROCEDURE:   After the risks benefits and alternatives of the procedure were thoroughly explained, informed consent was obtained.  The digital rectal exam revealed no abnormalities of the rectum.   The [id number]  endoscope was introduced through the anus and advanced to the cecum, which was identified by both the appendix and ileocecal valve. No adverse events experienced.   The quality of the prep was excellent. (MoviPrep was used)  The instrument was then slowly withdrawn as the colon was fully examined. Estimated blood loss is zero unless otherwise noted in this procedure report.  COLON FINDINGS: Three polyps were found in the rectum(4 mm), transverse colon(2 mm), and at the cecum(7 mm sessile).  A polypectomy was performed with a cold snare.  The resection was complete, the polyp tissue was completely retrieved and sent to histology.   The examination was otherwise normal.  Retroflexed views revealed internal hemorrhoids. The time to cecum = 5.1 Withdrawal time = 10.6   The scope was withdrawn and the procedure completed. COMPLICATIONS: There were no immediate  complications.  ENDOSCOPIC IMPRESSION: 1.   Three polyps were found in the rectum, transverse colon, and at the cecum; polypectomy was performed with a cold snare 2.   The examination was otherwise normal  RECOMMENDATIONS: 1. Repeat Colonoscopy in 3 years if all polyps adenomatous, otherwise 5 years.  eSigned:  Eustace Quail, MD 07/08/2015 11:14 AM   cc: The Patient and Kathlene November, MD

## 2015-07-08 NOTE — Patient Instructions (Signed)
Discharge instructions given. Handout on polyps. Resume previous medications. YOU HAD AN ENDOSCOPIC PROCEDURE TODAY AT THE [facility]:   Refer to the procedure report that was given to you for any specific questions about what was found during the examination.  If the procedure report does not answer your questions, please call your gastroenterologist to clarify.  If you requested that your care partner not be given the details of your procedure findings, then the procedure report has been included in a sealed envelope for you to review at your convenience later.  YOU SHOULD EXPECT: Some feelings of bloating in the abdomen. Passage of more gas than usual.  Walking can help get rid of the air that was put into your GI tract during the procedure and reduce the bloating. If you had a lower endoscopy (such as a colonoscopy or flexible sigmoidoscopy) you may notice spotting of blood in your stool or on the toilet paper. If you underwent a bowel prep for your procedure, you may not have a normal bowel movement for a few days.  Please Note:  You might notice some irritation and congestion in your nose or some drainage.  This is from the oxygen used during your procedure.  There is no need for concern and it should clear up in a day or so.  SYMPTOMS TO REPORT IMMEDIATELY:   Following lower endoscopy (colonoscopy or flexible sigmoidoscopy):  Excessive amounts of blood in the stool  Significant tenderness or worsening of abdominal pains  Swelling of the abdomen that is new, acute  Fever of 100F or higher   For urgent or emergent issues, a gastroenterologist can be reached at any hour by calling [phone].   DIET: Your first meal following the procedure should be a small meal and then it is ok to progress to your normal diet. Heavy or fried foods are harder to digest and may make you feel nauseous or bloated.  Likewise, meals heavy in dairy and vegetables can increase bloating.  Drink  plenty of fluids but you should avoid alcoholic beverages for 24 hours.  ACTIVITY:  You should plan to take it easy for the rest of today and you should NOT DRIVE or use heavy machinery until tomorrow (because of the sedation medicines used during the test).    FOLLOW UP: Our staff will call the number listed on your records the next business day following your procedure to check on you and address any questions or concerns that you may have regarding the information given to you following your procedure. If we do not reach you, we will leave a message.  However, if you are feeling well and you are not experiencing any problems, there is no need to return our call.  We will assume that you have returned to your regular daily activities without incident.  If any biopsies were taken you will be contacted by phone or by letter within the next 1-3 weeks.  Please call us at [phone] if you have not heard about the biopsies in 3 weeks.    SIGNATURES/CONFIDENTIALITY: You and/or your care partner have signed paperwork which will be entered into your electronic medical record.  These signatures attest to the fact that that the information above on your After Visit Summary has been reviewed and is understood.  Full responsibility of the confidentiality of this discharge information lies with you and/or your care-partner. 

## 2015-07-08 NOTE — Progress Notes (Signed)
Called to room to assist during endoscopic procedure.  Patient ID and intended procedure confirmed with present staff. Received instructions for my participation in the procedure from the performing physician.  

## 2015-07-11 ENCOUNTER — Other Ambulatory Visit: Payer: Self-pay | Admitting: Internal Medicine

## 2015-07-11 ENCOUNTER — Telehealth: Payer: Self-pay | Admitting: *Deleted

## 2015-07-11 NOTE — Telephone Encounter (Signed)
  Follow up Call-  Call back number 07/08/2015  Post procedure Call Back phone  # cell- [phone]  Permission to leave phone message Yes     Patient questions:  Do you have a fever, pain , or abdominal swelling? No. Pain Score  0 *  Have you tolerated food without any problems? Yes.    Have you been able to return to your normal activities? Yes.    Do you have any questions about your discharge instructions: Diet   No. Medications  No. Follow up visit  No.  Do you have questions or concerns about your Care? No.  Actions: * If pain score is 4 or above: No action needed, pain <4.

## 2015-07-12 ENCOUNTER — Encounter: Payer: Self-pay | Admitting: Internal Medicine

## 2015-07-13 ENCOUNTER — Other Ambulatory Visit: Payer: Self-pay

## 2015-08-31 ENCOUNTER — Telehealth: Payer: Self-pay

## 2015-08-31 NOTE — Telephone Encounter (Signed)
Called patient with recommended medications/Tx by Dr. Larose Kells.

## 2015-08-31 NOTE — Telephone Encounter (Signed)
Recommend  Mucinex DM OTC twice a day as needed for cough and congestion Flonase 2 sprays in each side of the nose daily Claritin 10 mg OTC daily as needed.

## 2015-08-31 NOTE — Telephone Encounter (Signed)
Patient called in will be in wedding this weekend and would like to know what he can take OTC that would clear his Nasal  and Chest congestion states its not severe but wants to try and clear up before wedding. States for past couple days he has had cough with drarinage. Patient is driving at this time on his way out [place] town. Has Hypertention as well

## 2015-09-13 ENCOUNTER — Other Ambulatory Visit: Payer: Self-pay | Admitting: Internal Medicine

## 2015-09-14 ENCOUNTER — Ambulatory Visit: Payer: BLUE CROSS/BLUE SHIELD | Admitting: Internal Medicine

## 2015-10-16 ENCOUNTER — Other Ambulatory Visit: Payer: Self-pay | Admitting: Internal Medicine

## 2015-11-17 ENCOUNTER — Telehealth: Payer: Self-pay | Admitting: Internal Medicine

## 2015-12-09 ENCOUNTER — Telehealth: Payer: Self-pay | Admitting: Internal Medicine

## 2015-12-09 MED ORDER — SIMVASTATIN 40 MG PO TABS: 40.0000 mg | ORAL_TABLET | Freq: Every day | ORAL | Status: DC

## 2015-12-09 MED ORDER — LOSARTAN POTASSIUM 50 MG PO TABS: 50.0000 mg | ORAL_TABLET | Freq: Every day | ORAL | Status: DC

## 2015-12-09 NOTE — Telephone Encounter (Signed)
Pharmacy: [id number] - [place], [place] - [address] AT [place]  Reason for call: pt has 4 days of meds left. Pharmacy won't transfer old RX. Pt insurance requiring change to [facility] for maintenance meds. Please send in for losartan and simvastatin.

## 2015-12-09 NOTE — Telephone Encounter (Signed)
error 

## 2015-12-09 NOTE — Telephone Encounter (Signed)
Rx's sent. 30 day supplies only, Pt's last appt was 02/2015, was to F/U w/ Dr. Larose Kells in 6 months. Never seen in 08/2015. Needs appt for further refills.

## 2015-12-09 NOTE — Telephone Encounter (Signed)
scheduled

## 2015-12-23 ENCOUNTER — Encounter: Payer: Self-pay | Admitting: Internal Medicine

## 2015-12-23 ENCOUNTER — Ambulatory Visit (INDEPENDENT_AMBULATORY_CARE_PROVIDER_SITE_OTHER): Payer: BLUE CROSS/BLUE SHIELD | Admitting: Internal Medicine

## 2015-12-23 VITALS — BP 112/74 | HR 69 | Temp 98.2°F | Ht 68.0 in | Wt 180.2 lb

## 2015-12-23 DIAGNOSIS — R739 Hyperglycemia, unspecified: Secondary | ICD-10-CM

## 2015-12-23 DIAGNOSIS — L989 Disorder of the skin and subcutaneous tissue, unspecified: Secondary | ICD-10-CM | POA: Diagnosis not present

## 2015-12-23 DIAGNOSIS — E785 Hyperlipidemia, unspecified: Secondary | ICD-10-CM

## 2015-12-23 DIAGNOSIS — I10 Essential (primary) hypertension: Secondary | ICD-10-CM | POA: Diagnosis not present

## 2015-12-23 LAB — LIPID PANEL
CHOL/HDL RATIO: 4
CHOLESTEROL: 175 mg/dL (ref 0–200)
HDL: 43.5 mg/dL (ref >39.00)
LDL CALC: 112 mg/dL — AB (ref 0–99)
NonHDL: 131.67
Triglycerides: 100 mg/dL (ref 0.0–149.0)
VLDL: 20 mg/dL (ref 0.0–40.0)

## 2015-12-23 LAB — HEMOGLOBIN A1C: HEMOGLOBIN A1C: 5.8 % (ref 4.6–6.5)

## 2015-12-23 NOTE — Progress Notes (Signed)
Pre visit review using our clinic review tool, if applicable. No additional management support is needed unless otherwise documented below in the visit note. 

## 2015-12-23 NOTE — Progress Notes (Signed)
Subjective:    Patient ID: Adrian Schneider, male    DOB: 17-Dec-1965, 50 y.o.   MRN: [id number]  DOS:  12/23/2015 Type of visit - description : Routine visit Interval history:  DM: Not doing well with diet and exercise. Hyperlipidemia: Good compliance of medication, no apparent side effects. Left ankle pain: On and off problem >> has sudden, sharp and short duration  pain sometimes when he goes upstairs, was prescribed a medication but that's not been helpful b/c pain last seconds. HTN: Good compliance of medications, ambulatory BPs BPs 120, [id number] with diastolic of 70. Moles: a lesion in the back started itching and eventually bled few days ago.  Review of Systems   Past Medical History  Diagnosis Date  . MVP (mitral valve prolapse)     dx years ago  . Cerebral palsy ([place])     dx at birth, has poor balance  . Wart     L foot, non- healing lesion, f/u by derm  . Hypertension 12/2009  . Hyperlipidemia 6/11    started meds  . Hemorrhoids   . Left ankle pain     MRI in 06/2015 did not show specific internal derangement, had mild degenerative chondral thinning in the tibiotalar joint w/o a focal lesion    Past Surgical History  Procedure Laterality Date  . Leg surgery      release muscles d/t "cerebral palsy"    Social History   Social History  . Marital Status: Married    Spouse Name: N/A  . Number of Children: 0  . Years of Education: N/A   Occupational History  . 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  [employer]   Social History Main Topics  . Smoking status: Never Smoker   . Smokeless tobacco: Never Used  . Alcohol Use: 0.0 oz/week    0 Standard drinks or equivalent per week     Comment: socially; weekly 1-2 beers weekly  . Drug Use: No  . Sexual Activity: Not on file   Other Topics Concern  . Not on file   Social History Narrative   Single, no children        Medication List       This list is accurate as of: 12/23/15  5:51 PM.  Always use your most recent med list.                 augmented betamethasone dipropionate 0.05 % cream  Commonly known as:  DIPROLENE-AF  Apply topically 2 (two) times daily. eczema leg     losartan 50 MG tablet  Commonly known as:  COZAAR  Take 1 tablet (50 mg total) by mouth daily.     meloxicam 15 MG tablet  Commonly known as:  MOBIC  Take 15 mg by mouth daily. Reported on 12/23/2015     simvastatin 40 MG tablet  Commonly known as:  ZOCOR  Take 1 tablet (40 mg total) by mouth daily.           Objective:   Physical Exam  Skin:      BP 112/74 mmHg  Pulse 69  Temp(Src) 98.2 F (36.8 C) (Oral)  Ht 5\' 8"  (1.727 m)  Wt 180 lb 4 oz (81.761 kg)  BMI 27.41 kg/m2  SpO2 98% General:   Well developed, well nourished . NAD.  HEENT:  Normocephalic . Face symmetric, atraumatic Lungs:  CTA B Normal respiratory effort, no intercostal retractions, no accessory muscle use. Heart:  RRR,  no murmur.  No pretibial edema bilaterally  Skin: Not pale. Not jaundice. Has multiple SK dark-looking lesions in the back, the one that concerns the patient is in the graphic. Scrotal and groin skin normal Neurologic:  alert & oriented X3.  Speech normal, gait limited, but unassisted. At baseline Psych--  Cognition and judgment appear intact.  Cooperative with normal attention span and concentration.  Behavior appropriate. No anxious or depressed appearing.      Assessment & Plan:   Assessment  Prediabetes HTN Hyperlipidemia, 2011 Cerebral palsy,   poor balance DJD  PLAN: Prediabetes: not doing well w/ diet-exercise, extensive discussion, encourage self learning, see AVS. Check a A1C Hyperlpidemia: Good compliance with medication, check FLP HTN: Well-controlled DJD: Continue having pain at the left ankle, recommend to continue working with his other MDs Eczema, groin-scrotal itch: Exam benign, recommend to use powder daily and add topical steroid if needed. Also rec to d/wh the dermatologist he will see Skin  lesions:  Recommend to see dermatology RTC 4 months, CPX  Today, I spent more than   28in with the patient: >50% of the time counseling regards diet-exercise, multiple questions asked about skin issues

## 2015-12-23 NOTE — Patient Instructions (Addendum)
GO TO THE LAB : Get the blood work    GO TO THE FRONT DESK Schedule a complete physical exam to be done in 4 months Please be fasting     All about diabetes, great resource!  [email or web address]

## 2016-01-13 ENCOUNTER — Other Ambulatory Visit: Payer: Self-pay | Admitting: Internal Medicine

## 2016-02-04 ENCOUNTER — Telehealth: Payer: Self-pay | Admitting: Internal Medicine

## 2016-03-14 MED ORDER — SIMVASTATIN 40 MG PO TABS: 40.0000 mg | ORAL_TABLET | Freq: Every day | ORAL | Status: DC

## 2016-03-14 NOTE — Telephone Encounter (Signed)
Pharmacy: [id number] - [place], [place] - [address] AT [address]  Reason for call: Pt states insurance is requiring 90 day supply of simvastatin now. Pharmacy told him we need to call or send new RX for 90 day supply.

## 2016-03-14 NOTE — Addendum Note (Signed)
Addended by: Rockwell Germany on: 03/14/2016 05:42 PM   Modules accepted: Orders

## 2016-03-14 NOTE — Telephone Encounter (Signed)
Rx request to pharmacy/SLS  

## 2016-05-16 ENCOUNTER — Encounter: Payer: Self-pay | Admitting: Internal Medicine

## 2016-05-16 ENCOUNTER — Ambulatory Visit (INDEPENDENT_AMBULATORY_CARE_PROVIDER_SITE_OTHER): Payer: BLUE CROSS/BLUE SHIELD | Admitting: Internal Medicine

## 2016-05-16 VITALS — BP 118/72 | HR 105 | Temp 98.8°F | Ht 68.0 in | Wt 181.4 lb

## 2016-05-16 DIAGNOSIS — Z0000 Encounter for general adult medical examination without abnormal findings: Secondary | ICD-10-CM

## 2016-05-16 DIAGNOSIS — Z09 Encounter for follow-up examination after completed treatment for conditions other than malignant neoplasm: Secondary | ICD-10-CM | POA: Insufficient documentation

## 2016-05-16 LAB — ALT: ALT: 29 U/L (ref 0–53)

## 2016-05-16 LAB — BASIC METABOLIC PANEL
BUN: 11 mg/dL (ref 6–23)
CO2: 27 mEq/L (ref 19–32)
Calcium: 9.4 mg/dL (ref 8.4–10.5)
Chloride: 104 mEq/L (ref 96–112)
Creatinine, Ser: 0.86 mg/dL (ref 0.40–1.50)
GFR: 99.11 mL/min (ref >60.00)
GLUCOSE: 99 mg/dL (ref 70–99)
POTASSIUM: 3.8 meq/L (ref 3.5–5.1)
SODIUM: 136 meq/L (ref 135–145)

## 2016-05-16 LAB — TSH: TSH: 1.34 u[IU]/mL (ref 0.35–4.50)

## 2016-05-16 LAB — AST: AST: 28 U/L (ref 0–37)

## 2016-05-16 MED ORDER — SIMVASTATIN 40 MG PO TABS: 40.0000 mg | ORAL_TABLET | Freq: Every day | ORAL | Status: DC

## 2016-05-16 MED ORDER — LOSARTAN POTASSIUM 50 MG PO TABS: 50.0000 mg | ORAL_TABLET | Freq: Every day | ORAL | Status: DC

## 2016-05-16 NOTE — Assessment & Plan Note (Signed)
Prediabetes: last A1c satisfactory, recheck in 6 months HTN: Continue losartan, checking a BMP Hyperlipidemia: Continue simvastatin, last FLP satisfactory Runny nose: Resolved, recommend Flonase as needed. RTC 6-8 months

## 2016-05-16 NOTE — Progress Notes (Signed)
Pre visit review using our clinic review tool, if applicable. No additional management support is needed unless otherwise documented below in the visit note. 

## 2016-05-16 NOTE — Progress Notes (Signed)
Subjective:    Patient ID: Adrian Schneider, male    DOB: [birth year], 50 y.o.   MRN: [id number]  DOS:  05/16/2016 Type of visit - description : CPX Interval history: Here for a CPX, no major concerns, good med compliance.   Review of Systems  Constitutional: No fever. No chills. No unexplained wt changes. No unusual sweats  HEENT: No dental problems, no ear discharge, no facial swelling, no voice changes. No eye discharge, no eye  redness , no  intolerance to light  Had a running nose last night, symptoms resolved this morning, minimal postnasal dripping. No associated symptoms Respiratory: No wheezing , no  difficulty breathing. No cough , no mucus production  Cardiovascular: No CP, no leg swelling , no  Palpitations  GI: no nausea, no vomiting, no diarrhea , no  abdominal pain.  No blood in the stools. No dysphagia, no odynophagia    Endocrine: No polyphagia, no polyuria , no polydipsia  GU: No dysuria, gross hematuria, difficulty urinating. No urinary urgency, no frequency.  Musculoskeletal: No joint swellings or unusual aches or pains  Skin: No change in the color of the skin, palor   Allergic, immunologic: No environmental allergies , no  food allergies  Neurological: No dizziness no  syncope. No headaches. No diplopia, no slurred, no slurred speech, no motor deficits, no facial  Numbness  Hematological: No enlarged lymph nodes, no easy bruising , no unusual bleedings  Psychiatry: No suicidal ideas, no hallucinations, no beavior problems, no confusion.  No unusual/severe anxiety, no depression  Past Medical History  Diagnosis Date  . MVP (mitral valve prolapse)     dx years ago  . Cerebral palsy ([place])     dx at birth, has poor balance  . Wart     L foot, non- healing lesion, f/u by derm  . Hypertension 12/2009  . Hyperlipidemia 6/11    started meds  . Hemorrhoids   . Left ankle pain     MRI in 06/2015 did not show specific internal derangement, had mild  degenerative chondral thinning in the tibiotalar joint w/o a focal lesion    Past Surgical History  Procedure Laterality Date  . Leg surgery      release muscles d/t "cerebral palsy"    Social History   Social History  . Marital Status: Married    Spouse Name: N/A  . Number of Children: 0  . Years of Education: N/A   Occupational History  . 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  [employer]   Social History Main Topics  . Smoking status: Never Smoker   . Smokeless tobacco: Never Used  . Alcohol Use: 0.0 oz/week    0 Standard drinks or equivalent per week     Comment: socially; weekly 1-2 beers weekly  . Drug Use: No  . Sexual Activity: Not on file   Other Topics Concern  . Not on file   Social History Narrative   Single, no children     Family History  Problem Relation Age of Onset  . Coronary artery disease Mother   . Hypertension Mother   . Coronary artery disease Maternal Grandmother   . Hypertension Father   . Diabetes Father   . Stroke Neg Hx   . Colon cancer Neg Hx   . Prostate cancer Neg Hx   . Esophageal cancer Neg Hx   . Stomach cancer Neg Hx   . Rectal cancer Neg Hx  Medication List       This list is accurate as of: 05/16/16  5:43 PM.  Always use your most recent med list.               losartan 50 MG tablet  Commonly known as:  COZAAR  Take 1 tablet (50 mg total) by mouth daily.     simvastatin 40 MG tablet  Commonly known as:  ZOCOR  Take 1 tablet (40 mg total) by mouth daily.           Objective:   Physical Exam BP 118/72 mmHg  Pulse 105  Temp(Src) 98.8 F (37.1 C) (Oral)  Ht 5\' 8"  (1.727 m)  Wt 181 lb 6 oz (82.271 kg)  BMI 27.58 kg/m2  SpO2 97%  General:   Well developed, well nourished . NAD.  Neck: No  thyromegaly  HEENT:  Normocephalic . Face symmetric, atraumatic. No nasal congestion, nostrils normal Lungs:  CTA B Normal respiratory effort, no intercostal retractions, no accessory muscle use. Heart: RRR,  no murmur.    No pretibial edema bilaterally  Abdomen:  Not distended, soft, non-tender. No rebound or rigidity.   Skin: Exposed areas without rash. Not pale. Not jaundice Neurologic:  alert & oriented X3.  Speech normal, gait   unassisted, at baseline, has CP. Psych: Cognition and judgment appear intact.  Cooperative with normal attention span and concentration.  Behavior appropriate. No anxious or depressed appearing.    Assessment & Plan:   Assessment  Prediabetes HTN Hyperlipidemia, 2011 Cerebral palsy,   poor balance DJD - sees [facility]  ortho prn  Sees dermatology q year, h/o groin eczema   PLAN: Prediabetes: last A1c satisfactory, recheck in 6 months HTN: Continue losartan, checking a BMP Hyperlipidemia: Continue simvastatin, last FLP satisfactory Runny nose: Resolved, recommend Flonase as needed. RTC 6-8 months

## 2016-05-16 NOTE — Assessment & Plan Note (Addendum)
Td 02-2015 Declined the pneumonia shot again today   Prostate cancer screening, DRE- PSA wnl 2016 Colon cancer screening, Cscope 2016, next per GI  Labs reviewed, due for the following: BMP, AST, ALT, TSH EKG-- nsr, no changes  Diet-exercise: discussed

## 2016-05-16 NOTE — Patient Instructions (Signed)
GO TO THE LAB : Get the blood work     GO TO THE FRONT DESK Schedule your next appointment for a  Routine check up in 8 months   Check the  blood pressure 2 or 3 times a month   Be sure your blood pressure is between 110/65 and  145/85.  if it is consistently higher or lower, let me know

## 2016-05-28 ENCOUNTER — Ambulatory Visit (INDEPENDENT_AMBULATORY_CARE_PROVIDER_SITE_OTHER): Payer: BLUE CROSS/BLUE SHIELD | Admitting: Medical

## 2016-05-28 ENCOUNTER — Encounter: Payer: Self-pay | Admitting: Medical

## 2016-05-28 VITALS — BP 116/78 | HR 99 | Temp 98.1°F | Ht 68.0 in | Wt 181.2 lb

## 2016-05-28 DIAGNOSIS — J0100 Acute maxillary sinusitis, unspecified: Secondary | ICD-10-CM

## 2016-05-28 DIAGNOSIS — J309 Allergic rhinitis, unspecified: Secondary | ICD-10-CM | POA: Diagnosis not present

## 2016-05-28 DIAGNOSIS — J209 Acute bronchitis, unspecified: Secondary | ICD-10-CM

## 2016-05-28 MED ORDER — BENZONATATE 100 MG PO CAPS: 100.0000 mg | ORAL_CAPSULE | Freq: Three times a day (TID) | ORAL | Status: DC | PRN

## 2016-05-28 MED ORDER — FLUTICASONE PROPIONATE 50 MCG/ACT NA SUSP: 2.0000 | Freq: Every day | NASAL | Status: DC

## 2016-05-28 MED ORDER — AZITHROMYCIN 250 MG PO TABS: ORAL_TABLET | ORAL | Status: DC

## 2016-05-28 NOTE — Progress Notes (Signed)
Pre visit review using our clinic review tool, if applicable. No additional management support is needed unless otherwise documented below in the visit note. 

## 2016-05-28 NOTE — Patient Instructions (Addendum)
Your may have started with allergies 2 weeks ago. But may have sinus infection and bronchitis now.  For nasal congestion rx flonase.  For cough benzonatate.  For bronchitis and sinus infection will rx azithromycin.  Will rx albuterol inhaler if you need for wheezing.  Follow up in 7 days or as needed

## 2016-05-28 NOTE — Progress Notes (Signed)
Subjective:    Patient ID: Adrian Schneider Schneider, male    DOB: 05/09/1966, 50 y.o.   MRN: [id number]  HPI   Pt in with mild pnd on physical. But since Physical Exam 2 weeks ago feels like nasal congestion now in his chest. Some occasional productive cough. Cough is hacky. Fever one week ago 101 when nasal congestion moved to chest.   No body aches. States some lingering nasal congestion and sinus pressure. When blows nose will get colored mucous.   Review of Systems  Constitutional: Positive for fever. Negative for chills and fatigue.       1 wks ago.  HENT: Positive for congestion and sinus pressure. Negative for rhinorrhea, sneezing and sore throat.   Respiratory: Positive for cough and wheezing. Negative for shortness of breath.        See hpi.  Cardiovascular: Negative for chest pain and palpitations.  Gastrointestinal: Negative for abdominal pain.  Musculoskeletal: Negative for back pain.  Skin: Negative for rash.  Neurological: Negative for dizziness, speech difficulty, weakness and headaches.  Hematological: Negative for adenopathy. Does not bruise/bleed easily.  Psychiatric/Behavioral: Negative for behavioral problems and confusion.    Past Medical History  Diagnosis Date  . MVP (mitral valve prolapse)     dx years ago  . Cerebral palsy ([place])     dx at birth, has poor balance  . Wart     L foot, non- healing lesion, f/u by derm  . Hypertension 12/2009  . Hyperlipidemia 6/11    started meds  . Hemorrhoids   . Left ankle pain     MRI in 06/2015 did not show specific internal derangement, had mild degenerative chondral thinning in the tibiotalar joint w/o a focal lesion     Social History   Social History  . Marital Status: Married    Spouse Name: N/A  . Number of Children: 0  . Years of Education: N/A   Occupational History  . 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  [employer]   Social History Main Topics  . Smoking status: Never Smoker   . Smokeless tobacco: Never Used  .  Alcohol Use: 0.0 oz/week    0 Standard drinks or equivalent per week     Comment: socially; weekly 1-2 beers weekly  . Drug Use: No  . Sexual Activity: Not on file   Other Topics Concern  . Not on file   Social History Narrative   Single, no children    Past Surgical History  Procedure Laterality Date  . Leg surgery      release muscles d/t "cerebral palsy"    Family History  Problem Relation Age of Onset  . Coronary artery disease Mother   . Hypertension Mother   . Coronary artery disease Maternal Grandmother   . Hypertension Father   . Diabetes Father   . Stroke Neg Hx   . Colon cancer Neg Hx   . Prostate cancer Neg Hx   . Esophageal cancer Neg Hx   . Stomach cancer Neg Hx   . Rectal cancer Neg Hx     Allergies  Allergen Reactions  . Mold Extract [Trichophyton] Cough    Current Outpatient Prescriptions on File Prior to Visit  Medication Sig Dispense Refill  . losartan (COZAAR) 50 MG tablet Take 1 tablet (50 mg total) by mouth daily. 90 tablet 2  . simvastatin (ZOCOR) 40 MG tablet Take 1 tablet (40 mg total) by mouth daily. 90 tablet  2   No current facility-administered medications on file prior to visit.    BP 116/78 mmHg  Pulse 99  Temp(Src) 98.1 F (36.7 C) (Oral)  Ht 5\' 8"  (1.727 m)  Wt 181 lb 3.2 oz (82.192 kg)  BMI 27.56 kg/m2  SpO2 98%       Objective:   Physical Exam  General  Mental Status - Alert. General Appearance - Well groomed. Not in acute distress.  Skin Rashes- No Rashes.  HEENT Head- Normal. Ear Auditory Canal - Left- Normal. Right - Normal.Tympanic Membrane- Left- Normal. Right- Normal. Eye Sclera/Conjunctiva- Left- Normal. Right- Normal. Nose & Sinuses Nasal Mucosa- Left-  Boggy and Congested. Right-  Boggy and  Congested.Bilateral  No maxillary and   No frontal sinus pressure. Mouth & Throat Lips: Upper Lip- Normal: no dryness, cracking, pallor, cyanosis, or vesicular eruption. Lower Lip-Normal: no dryness, cracking,  pallor, cyanosis or vesicular eruption. Buccal Mucosa- Bilateral- No Aphthous ulcers. Oropharynx- No Discharge or Erythema. +pnd. Tonsils: Characteristics- Bilateral- No Erythema or Congestion. Size/Enlargement- Bilateral- No enlargement. Discharge- bilateral-None.  Neck Neck- Supple. No Masses.   Chest and Lung Exam Auscultation: Breath Sounds:-even and unlabored. Faint rough breath sounds. Some hacking when he breaths deep.  Cardiovascular Auscultation:Rythm- Regular, rate and rhythm. Murmurs & Other Heart Sounds:Ausculatation of the heart reveal- No Murmurs.  Lymphatic Head & Neck General Head & Neck Lymphatics: Bilateral: Description- No Localized lymphadenopathy.       Assessment & Plan:  Your may have started with allergies 2 weeks ago. But may have sinus infection and bronchitis now.  For nasal congestion rx flonase.  For cough benzonatate.  For bronchitis and sinus infection will rx azithromycin.  Will rx albuterol inhaler if you need for wheezing.  Follow up in 7 days or as needed

## 2016-06-28 DIAGNOSIS — H40013 Open angle with borderline findings, low risk, bilateral: Secondary | ICD-10-CM | POA: Diagnosis not present

## 2016-07-19 DIAGNOSIS — S060XAA Concussion with loss of consciousness status unknown, initial encounter: Secondary | ICD-10-CM

## 2016-07-19 DIAGNOSIS — S060X9A Concussion with loss of consciousness of unspecified duration, initial encounter: Secondary | ICD-10-CM

## 2016-07-19 HISTORY — DX: Concussion with loss of consciousness of unspecified duration, initial encounter: S06.0X9A

## 2016-07-19 HISTORY — DX: Concussion with loss of consciousness status unknown, initial encounter: S06.0XAA

## 2016-07-28 ENCOUNTER — Emergency Department (HOSPITAL_BASED_OUTPATIENT_CLINIC_OR_DEPARTMENT_OTHER)
Admission: EM | Admit: 2016-07-28 | Discharge: 2016-07-28 | Disposition: A | Discharge status: Home / Self Care | Payer: BLUE CROSS/BLUE SHIELD | Attending: Emergency Medicine | Admitting: Emergency Medicine

## 2016-07-28 ENCOUNTER — Encounter (HOSPITAL_BASED_OUTPATIENT_CLINIC_OR_DEPARTMENT_OTHER): Payer: Self-pay | Admitting: Adult Health

## 2016-07-28 DIAGNOSIS — W19XXXA Unspecified fall, initial encounter: Secondary | ICD-10-CM

## 2016-07-28 DIAGNOSIS — Z79899 Other long term (current) drug therapy: Secondary | ICD-10-CM | POA: Diagnosis not present

## 2016-07-28 DIAGNOSIS — Y9389 Activity, other specified: Secondary | ICD-10-CM | POA: Diagnosis not present

## 2016-07-28 DIAGNOSIS — W109XXA Fall (on) (from) unspecified stairs and steps, initial encounter: Secondary | ICD-10-CM | POA: Diagnosis not present

## 2016-07-28 DIAGNOSIS — S0990XA Unspecified injury of head, initial encounter: Secondary | ICD-10-CM | POA: Diagnosis present

## 2016-07-28 DIAGNOSIS — S0083XA Contusion of other part of head, initial encounter: Secondary | ICD-10-CM | POA: Insufficient documentation

## 2016-07-28 DIAGNOSIS — Y999 Unspecified external cause status: Secondary | ICD-10-CM | POA: Insufficient documentation

## 2016-07-28 DIAGNOSIS — I10 Essential (primary) hypertension: Secondary | ICD-10-CM | POA: Insufficient documentation

## 2016-07-28 DIAGNOSIS — Y929 Unspecified place or not applicable: Secondary | ICD-10-CM | POA: Insufficient documentation

## 2016-07-28 MED ORDER — METHOCARBAMOL 500 MG PO TABS: 500.0000 mg | ORAL_TABLET | Freq: Two times a day (BID) | ORAL | 0 refills | Status: DC

## 2016-07-28 MED ORDER — KETOROLAC TROMETHAMINE 30 MG/ML IJ SOLN
30.0000 mg | Freq: Once | INTRAMUSCULAR | Status: AC
  Administered 2016-07-28: 30 mg via INTRAVENOUS
  Filled 2016-07-28: qty 1

## 2016-07-28 NOTE — Discharge Instructions (Signed)
Take your medications as prescribed. Do not take them before driving or operating machinery as they can make you drowsy.. Follow-up With your doctor as needed. Return to ED for new or worsening symptoms as we discussed.

## 2016-07-28 NOTE — ED Triage Notes (Signed)
Presents post fall, going down cement steps and missed one, face planted into cement. Large abraision to forehead, right elbow abrasion, right thigh pain from fall. Endorses dizziness post fall, denies nausea and loss of consciousness

## 2016-07-28 NOTE — ED Provider Notes (Signed)
[facility] DEPT [facility] Provider Note   CSN: [id number] Arrival date & time: 07/28/16  1844  By signing my name below, I, Jeanell Sparrow, attest that this documentation has been prepared under the direction and in the presence of non-physician practitioner, Bejamin Onie Kasparek, PA-C. Electronically Signed: Jeanell Sparrow, Scribe. 07/28/2016. 7:54 PM.  History   Chief Complaint Chief Complaint  Patient presents with  . Fall   The history is provided by the patient. No language interpreter was used.   HPI Comments: Adrian Schneider is a 50 y.o. male with a hx of cerebral palsy who presents to the Emergency Department complaining of a fall that occurred today. He reports he going down stairs when he missed one step, fell face first into cement, and hit his RLE. Denies LOC. He has wounds on his forehead and right elbow. Associated symptoms include lightheadedness and tingling. No treatment PTA. Denies use of blood thinners, gait problem beyond baseline, nausea, vomiting, visual disturbance, ear discharge, or nose discharge.   Past Medical History:  Diagnosis Date  . Cerebral palsy ([place])    dx at birth, has poor balance  . Hemorrhoids   . Hyperlipidemia 6/11   started meds  . Hypertension 12/2009  . Left ankle pain    MRI in 06/2015 did not show specific internal derangement, had mild degenerative chondral thinning in the tibiotalar joint w/o a focal lesion  . MVP (mitral valve prolapse)    dx years ago  . Wart    L foot, non- healing lesion, f/u by derm    Patient Active Problem List   Diagnosis Date Noted  . PCP NOTES>>>>>>>>>>>>>>>>>>>> 05/16/2016  . Skin lesion 12/23/2015  . Prediabetes 01/27/2014  . Hemorrhoids   . Groin pain 02/08/2012  . Annual physical exam 05/21/2011  . HYPERLIPIDEMIA 11/03/2010  . BACK PAIN 02/09/2010  . Essential hypertension 12/21/2009    Past Surgical History:  Procedure Laterality Date  . LEG SURGERY     release muscles d/t "cerebral palsy"        Home Medications    Prior to Admission medications   Medication Sig Start Date End Date Taking? Authorizing Provider  azithromycin (ZITHROMAX) 250 MG tablet Take 2 tablets by mouth on day 1, followed by 1 tablet by mouth daily for 4 days. 05/28/16   Percell Miller Saguier, PA-C  benzonatate (TESSALON) 100 MG capsule Take 1 capsule (100 mg total) by mouth 3 (three) times daily as needed. 05/28/16   Edward Saguier, PA-C  fluticasone (FLONASE) 50 MCG/ACT nasal spray Place 2 sprays into both nostrils daily. 05/28/16   Percell Miller Saguier, PA-C  losartan (COZAAR) 50 MG tablet Take 1 tablet (50 mg total) by mouth daily. 05/16/16   Colon Branch, MD  methocarbamol (ROBAXIN) 500 MG tablet Take 1 tablet (500 mg total) by mouth 2 (two) times daily. 07/28/16   Comer Locket, PA-C  simvastatin (ZOCOR) 40 MG tablet Take 1 tablet (40 mg total) by mouth daily. 05/16/16   Colon Branch, MD    Family History Family History  Problem Relation Age of Onset  . Coronary artery disease Mother   . Hypertension Mother   . Coronary artery disease Maternal Grandmother   . Hypertension Father   . Diabetes Father   . Stroke Neg Hx   . Colon cancer Neg Hx   . Prostate cancer Neg Hx   . Esophageal cancer Neg Hx   . Stomach cancer Neg Hx   . Rectal cancer Neg Hx  Social History Social History  Substance Use Topics  . Smoking status: Never Smoker  . Smokeless tobacco: Never Used  . Alcohol use 0.0 oz/week     Comment: socially; weekly 1-2 beers weekly     Allergies   Mold extract [trichophyton]   Review of Systems Review of Systems A complete 10 system review of systems was obtained and all systems are negative except as noted in the HPI and PMH.   Physical Exam Updated Vital Signs BP 143/98 (BP Location: Right Arm)   Pulse 78   Temp 98 F (36.7 C) (Oral)   Resp 18   SpO2 98%   Physical Exam  Constitutional: He is oriented to person, place, and time. He appears well-developed and well-nourished. No  distress.  HENT:  Head: Normocephalic. Head is with abrasion. Head is without raccoon's eyes, without Battle's sign, without right periorbital erythema and without left periorbital erythema.    Right Ear: Tympanic membrane normal.  Left Ear: Tympanic membrane normal.  Mild, superficial abrasion to anterior right forehead.  Eyes: Conjunctivae are normal.  Neck: Neck supple.  Cardiovascular: Normal rate and regular rhythm.   Pulmonary/Chest: Effort normal.  Abdominal: Soft.  Musculoskeletal: Normal range of motion.  Full active ROM of back. No cervical TTP.   Neurological: He is alert and oriented to person, place, and time.  Cranial nerves 2-12 grossly intact. Motor strength is 5/5 in all extremities. Sensation intact to light touch. Finger to nose normal. No pronator drift. Gait is at baseline.    Skin: Skin is warm and dry.  Psychiatric: He has a normal mood and affect.  Nursing note and vitals reviewed.    ED Treatments / Results  DIAGNOSTIC STUDIES: Oxygen Saturation is 98% on RA, normal by my interpretation.    COORDINATION OF CARE: 8:00 PM- Pt advised of plan for treatment and pt agrees.  Labs (all labs ordered are listed, but only abnormal results are displayed) Labs Reviewed - No data to display  EKG  EKG Interpretation None       Radiology No results found.  Procedures Procedures (including critical care time)  Medications Ordered in ED Medications  ketorolac (TORADOL) 30 MG/ML injection 30 mg (30 mg Intravenous Given 07/28/16 2011)     Initial Impression / Assessment and Plan / ED Course  I have reviewed the triage vital signs and the nursing notes.  Pertinent labs & imaging results that were available during my care of the patient were reviewed by me and considered in my medical decision making (see chart for details).  Clinical Course    Presents after mechanical fall. Likely sustained mild concussion. Physical exam was reassuring. PER [place]  head CT, no further imaging is indicated. Patient overall appears well, has nonfocal neuro exam at baseline gait. Discussed symptomatic support at home, follow up with PCP. Strict return precautions discussed. Brother-in-law at bedside agrees patient is at baseline. Patient overall appears very well, appropriate for discharge and agrees with plan.  Final Clinical Impressions(s) / ED Diagnoses   Final diagnoses:  Fall, initial encounter  Facial contusion, initial encounter    New Prescriptions Discharge Medication List as of 07/28/2016  8:09 PM    START taking these medications   Details  methocarbamol (ROBAXIN) 500 MG tablet Take 1 tablet (500 mg total) by mouth 2 (two) times daily., Starting Sat 07/28/2016, Print       I personally performed the services described in this documentation, which was scribed in my presence. The recorded  information has been reviewed and is accurate.     Comer Locket, PA-C 07/28/16 2059    Makoto Grosser, MD 07/29/16 [phone]

## 2016-08-10 ENCOUNTER — Other Ambulatory Visit: Payer: Self-pay | Admitting: Internal Medicine

## 2016-08-23 ENCOUNTER — Other Ambulatory Visit: Payer: Self-pay | Admitting: Internal Medicine

## 2016-09-07 ENCOUNTER — Encounter: Payer: Self-pay | Admitting: Internal Medicine

## 2016-09-12 DIAGNOSIS — H9041 Sensorineural hearing loss, unilateral, right ear, with unrestricted hearing on the contralateral side: Secondary | ICD-10-CM | POA: Diagnosis not present

## 2016-09-18 DIAGNOSIS — H9313 Tinnitus, bilateral: Secondary | ICD-10-CM | POA: Diagnosis not present

## 2016-10-18 DIAGNOSIS — H9313 Tinnitus, bilateral: Secondary | ICD-10-CM | POA: Diagnosis not present

## 2016-11-26 DIAGNOSIS — H9313 Tinnitus, bilateral: Secondary | ICD-10-CM | POA: Diagnosis not present

## 2017-01-03 DIAGNOSIS — H9313 Tinnitus, bilateral: Secondary | ICD-10-CM | POA: Diagnosis not present

## 2017-01-03 DIAGNOSIS — H903 Sensorineural hearing loss, bilateral: Secondary | ICD-10-CM | POA: Diagnosis not present

## 2017-01-08 DIAGNOSIS — H40013 Open angle with borderline findings, low risk, bilateral: Secondary | ICD-10-CM | POA: Diagnosis not present

## 2017-01-16 ENCOUNTER — Encounter: Payer: Self-pay | Admitting: Internal Medicine

## 2017-01-16 ENCOUNTER — Ambulatory Visit (INDEPENDENT_AMBULATORY_CARE_PROVIDER_SITE_OTHER): Payer: BLUE CROSS/BLUE SHIELD | Admitting: Internal Medicine

## 2017-01-16 VITALS — BP 124/72 | HR 70 | Temp 98.4°F | Resp 12 | Ht 68.0 in | Wt 184.2 lb

## 2017-01-16 DIAGNOSIS — Z1159 Encounter for screening for other viral diseases: Secondary | ICD-10-CM

## 2017-01-16 DIAGNOSIS — R7303 Prediabetes: Secondary | ICD-10-CM

## 2017-01-16 DIAGNOSIS — E785 Hyperlipidemia, unspecified: Secondary | ICD-10-CM | POA: Diagnosis not present

## 2017-01-16 DIAGNOSIS — I10 Essential (primary) hypertension: Secondary | ICD-10-CM

## 2017-01-16 LAB — LIPID PANEL
CHOLESTEROL: 171 mg/dL (ref 0–200)
HDL: 46.3 mg/dL (ref >39.00)
LDL Cholesterol: 101 mg/dL — ABNORMAL HIGH (ref 0–99)
NonHDL: 124.61
Total CHOL/HDL Ratio: 4
Triglycerides: 118 mg/dL (ref 0.0–149.0)
VLDL: 23.6 mg/dL (ref 0.0–40.0)

## 2017-01-16 LAB — BASIC METABOLIC PANEL
BUN: 11 mg/dL (ref 6–23)
CALCIUM: 9.1 mg/dL (ref 8.4–10.5)
CO2: 27 mEq/L (ref 19–32)
Chloride: 104 mEq/L (ref 96–112)
Creatinine, Ser: 0.94 mg/dL (ref 0.40–1.50)
GFR: 89.21 mL/min (ref >60.00)
Glucose, Bld: 95 mg/dL (ref 70–99)
POTASSIUM: 4 meq/L (ref 3.5–5.1)
SODIUM: 136 meq/L (ref 135–145)

## 2017-01-16 LAB — HEMOGLOBIN A1C: HEMOGLOBIN A1C: 5.9 % (ref 4.6–6.5)

## 2017-01-16 LAB — HEPATITIS C ANTIBODY: HCV AB: NEGATIVE

## 2017-01-16 NOTE — Progress Notes (Signed)
Pre visit review using our clinic review tool, if applicable. No additional management support is needed unless otherwise documented below in the visit note. 

## 2017-01-16 NOTE — Patient Instructions (Signed)
GO TO THE LAB : Get the blood work     GO TO THE FRONT DESK Schedule your next appointment for a  Physical by [phone]

## 2017-01-16 NOTE — Progress Notes (Signed)
Subjective:    Patient ID: Adrian Schneider, male    DOB: 16-Aug-1966, 51 y.o.   MRN: [id number]  DOS:  01/16/2017 Type of visit - description : rov Interval history: Since the last time he was here, he developed tinnitus,L ear after being exposed to noise in a concert, saw ENT, symptoms are improving a little but still there. Reports he has been doing poorly with diet, not exercising. He realizes out of shape and "gets windy walking at the parking ". Denies chest pain, palpitations, lower extremity edema. No wheezing. Had a fall a few months ago, went to the ER, completely recovered   Review of Systems See history of present illness  Past Medical History:  Diagnosis Date  . Cerebral palsy ([place])    dx at birth, has poor balance  . Hemorrhoids   . Hyperlipidemia 6/11   started meds  . Hypertension 12/2009  . Left ankle pain    MRI in 06/2015 did not show specific internal derangement, had mild degenerative chondral thinning in the tibiotalar joint w/o a focal lesion  . MVP (mitral valve prolapse)    dx years ago  . Wart    L foot, non- healing lesion, f/u by derm    Past Surgical History:  Procedure Laterality Date  . LEG SURGERY     release muscles d/t "cerebral palsy"    Social History   Social History  . Marital status: Married    Spouse name: N/A  . Number of children: 0  . Years of education: N/A   Occupational History  . 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  [employer]   Social History Main Topics  . Smoking status: Never Smoker  . Smokeless tobacco: Never Used  . Alcohol use 0.0 oz/week     Comment: socially; weekly 1-2 beers weekly  . Drug use: No  . Sexual activity: Not on file   Other Topics Concern  . Not on file   Social History Narrative   Single, no children      Allergies as of 01/16/2017      Reactions   Mold Extract [trichophyton] Cough      Medication List       Accurate as of 01/16/17 11:59 PM. Always use your most recent med list.          losartan 50 MG tablet Commonly known as:  COZAAR Take 1 tablet (50 mg total) by mouth daily.   simvastatin 40 MG tablet Commonly known as:  ZOCOR Take 1 tablet (40 mg total) by mouth daily.          Objective:   Physical Exam BP 124/72 (BP Location: Left Arm, Patient Position: Sitting, Cuff Size: Normal)   Pulse 70   Temp 98.4 F (36.9 C) (Oral)   Resp 12   Ht 5\' 8"  (1.727 m)   Wt 184 lb 4 oz (83.6 kg)   SpO2 98%   BMI 28.02 kg/m  General:   Well developed, well nourished . NAD.  HEENT:  Normocephalic . Face symmetric, atraumatic Lungs:  CTA B Normal respiratory effort, no intercostal retractions, no accessory muscle use. Heart: RRR,  no murmur.  No pretibial edema bilaterally  Skin: Not pale. Not jaundice Neurologic:  alert & oriented X3.  Speech normal Psych--  Cognition and judgment appear intact.  Cooperative with normal attention span and concentration.  Behavior appropriate. No anxious or depressed appearing.      Assessment & Plan:  Assessment  Prediabetes HTN Hyperlipidemia, 2011 Cerebral palsy,   poor balance DJD - sees [facility]  ortho prn  Sees dermatology q year, h/o groin eczema  Tinnitus ~ 11-2016,, saw Dr. Cresenciano Lick  PLAN: Prediabetes: Admits to poor lifestyle, he is determined to change, has plans to go back to exercise gradually and his sisters are teaching him how to cook healthy. Check A1c HTN: No ambulatory BPs, BP today is very good, continue losartan. Check a BMP Hyperlipidemia: Continue simvastatin, check a FLP Hep C screening today RTC 04-2017, CPX

## 2017-01-17 NOTE — Assessment & Plan Note (Signed)
Prediabetes: Admits to poor lifestyle, he is determined to change, has plans to go back to exercise gradually and his sisters are teaching him how to cook healthy. Check A1c HTN: No ambulatory BPs, BP today is very good, continue losartan. Check a BMP Hyperlipidemia: Continue simvastatin, check a FLP Hep C screening today RTC 04-2017, CPX

## 2017-02-03 ENCOUNTER — Other Ambulatory Visit: Payer: Self-pay | Admitting: Internal Medicine

## 2017-02-28 DIAGNOSIS — M25522 Pain in left elbow: Secondary | ICD-10-CM | POA: Diagnosis not present

## 2017-03-06 DIAGNOSIS — M25522 Pain in left elbow: Secondary | ICD-10-CM | POA: Diagnosis not present

## 2017-06-07 ENCOUNTER — Ambulatory Visit (INDEPENDENT_AMBULATORY_CARE_PROVIDER_SITE_OTHER): Payer: BLUE CROSS/BLUE SHIELD | Admitting: Internal Medicine

## 2017-06-07 ENCOUNTER — Encounter: Payer: Self-pay | Admitting: Internal Medicine

## 2017-06-07 ENCOUNTER — Ambulatory Visit (HOSPITAL_BASED_OUTPATIENT_CLINIC_OR_DEPARTMENT_OTHER)
Admission: RE | Admit: 2017-06-07 | Discharge: 2017-06-07 | Disposition: A | Discharge status: Home / Self Care | Payer: BLUE CROSS/BLUE SHIELD | Source: Ambulatory Visit | Attending: Internal Medicine | Admitting: Internal Medicine

## 2017-06-07 VITALS — BP 121/82 | HR 80 | Temp 98.6°F | Ht 68.0 in | Wt 183.2 lb

## 2017-06-07 DIAGNOSIS — S299XXA Unspecified injury of thorax, initial encounter: Secondary | ICD-10-CM | POA: Diagnosis not present

## 2017-06-07 DIAGNOSIS — W19XXXA Unspecified fall, initial encounter: Secondary | ICD-10-CM

## 2017-06-07 DIAGNOSIS — R0781 Pleurodynia: Secondary | ICD-10-CM

## 2017-06-07 MED ORDER — CYCLOBENZAPRINE HCL 10 MG PO TABS: 10.0000 mg | ORAL_TABLET | Freq: Every evening | ORAL | 0 refills | Status: DC | PRN

## 2017-06-07 NOTE — Patient Instructions (Signed)
   GO TO THE FRONT DESK Schedule your next appointment for a  physical exam at your convenience    STOP BY THE FIRST FLOOR:  get the XR    For pain: Tylenol  500 mg OTC 2 tabs a day every 8 hours as needed for pain  Flexeril a muscle relaxant, at night. Will cause drowsiness  Call if not gradually improving

## 2017-06-07 NOTE — Progress Notes (Signed)
Pre visit review using our clinic review tool, if applicable. No additional management support is needed unless otherwise documented below in the visit note. 

## 2017-06-07 NOTE — Assessment & Plan Note (Signed)
Chest contusion: We'll get a x-ray to rule out a refracture, pain control with Tylenol, he is feeling some muscle tightness around the contusion, prescribe Flexeril for bedtime only. Call if not gradually better. Frequent falls: Went to the ER 05-2015, 07-2016 for falls. I told the patient I'm concern about the next fall which may cause a major injury. Recommend PT, start using a cane, he remains quite reluctant.  Due for a CPX, at his earliest convenience

## 2017-06-07 NOTE — Progress Notes (Signed)
Subjective:    Patient ID: Adrian Schneider, male    DOB: [birth year], 51 y.o.   MRN: [id number]  DOS:  06/07/2017 Type of visit - description : Acute Interval history: Incident happened at home.  3 days ago he stumbled, lost his balance, fell and hit his left posterior chest on the coffee table. Having pain since.   Review of Systems Denies neck pain, headaches or loss of consciousness.  Past Medical History:  Diagnosis Date  . Cerebral palsy ([place])    dx at birth, has poor balance  . Hemorrhoids   . Hyperlipidemia 6/11   started meds  . Hypertension 12/2009  . Left ankle pain    MRI in 06/2015 did not show specific internal derangement, had mild degenerative chondral thinning in the tibiotalar joint w/o a focal lesion  . MVP (mitral valve prolapse)    dx years ago  . Wart    L foot, non- healing lesion, f/u by derm    Past Surgical History:  Procedure Laterality Date  . LEG SURGERY     release muscles d/t "cerebral palsy"    Social History   Social History  . Marital status: Married    Spouse name: N/A  . Number of children: 0  . Years of education: N/A   Occupational History  . 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  [employer]   Social History Main Topics  . Smoking status: Never Smoker  . Smokeless tobacco: Never Used  . Alcohol use 0.0 oz/week     Comment: socially; weekly 1-2 beers weekly  . Drug use: No  . Sexual activity: Not on file   Other Topics Concern  . Not on file   Social History Narrative   Single, no children      Allergies as of 06/07/2017      Reactions   Mold Extract [trichophyton] Cough      Medication List       Accurate as of 06/07/17  5:27 PM. Always use your most recent med list.          cyclobenzaprine 10 MG tablet Commonly known as:  FLEXERIL Take 1 tablet (10 mg total) by mouth at bedtime as needed for muscle spasms.   losartan 50 MG tablet Commonly known as:  COZAAR Take 1 tablet (50 mg total) by mouth daily.     simvastatin 40 MG tablet Commonly known as:  ZOCOR Take 1 tablet (40 mg total) by mouth daily.          Objective:   Physical Exam  Musculoskeletal:       Arms:  BP 121/82 (BP Location: Left Arm, Patient Position: Sitting, Cuff Size: Small)   Pulse 80   Temp 98.6 F (37 C) (Oral)   Ht 5\' 8"  (1.727 m)   Wt 183 lb 4 oz (83.1 kg)   SpO2 97%   BMI 27.86 kg/m  General:   Well developed, well nourished . NAD.  HEENT:  Normocephalic . Face symmetric, atraumatic Lungs:  CTA B Normal respiratory effort, no intercostal retractions, no accessory muscle use. Heart: RRR,  no murmur.  no pretibial edema bilaterally  Abdomen:  Not distended, soft, non-tender. No rebound or rigidity.  Skin: Not pale. Not jaundice Neurologic:  alert & oriented X3.  Speech normal, gait  at baseline Psych--  Cognition and judgment appear intact.  Cooperative with normal attention span and concentration.  Behavior appropriate. No anxious or depressed appearing.  Assessment & Plan:    Assessment  Prediabetes HTN Hyperlipidemia, 2011 Cerebral palsy,   poor balance DJD - sees [facility]o prn  Sees dermatology q year, h/o groin eczema  Tinnitus ~ 11-2016, saw Dr. Cresenciano Lick  PLAN: Chest contusion: We'll get a x-ray to rule out a refracture, pain control with Tylenol, he is feeling some muscle tightness around the contusion, prescribe Flexeril for bedtime only. Call if not gradually better. Frequent falls: Went to the ER 05-2015, 07-2016 for falls. I told the patient I'm concern about the next fall which may cause a major injury. Recommend PT, start using a cane, he remains quite reluctant.  Due for a CPX, at his earliest convenience

## 2017-06-21 ENCOUNTER — Other Ambulatory Visit: Payer: Self-pay | Admitting: Internal Medicine

## 2017-07-08 DIAGNOSIS — H40013 Open angle with borderline findings, low risk, bilateral: Secondary | ICD-10-CM | POA: Diagnosis not present

## 2017-08-09 ENCOUNTER — Other Ambulatory Visit: Payer: Self-pay | Admitting: Internal Medicine

## 2017-09-30 ENCOUNTER — Ambulatory Visit (INDEPENDENT_AMBULATORY_CARE_PROVIDER_SITE_OTHER): Payer: BLUE CROSS/BLUE SHIELD | Admitting: Internal Medicine

## 2017-09-30 ENCOUNTER — Encounter: Payer: Self-pay | Admitting: Internal Medicine

## 2017-09-30 VITALS — BP 128/70 | HR 74 | Temp 98.0°F | Resp 14 | Ht 68.0 in | Wt 179.0 lb

## 2017-09-30 DIAGNOSIS — Z0000 Encounter for general adult medical examination without abnormal findings: Secondary | ICD-10-CM

## 2017-09-30 DIAGNOSIS — G808 Other cerebral palsy: Secondary | ICD-10-CM | POA: Diagnosis not present

## 2017-09-30 DIAGNOSIS — G809 Cerebral palsy, unspecified: Secondary | ICD-10-CM | POA: Insufficient documentation

## 2017-09-30 LAB — CBC WITH DIFFERENTIAL/PLATELET
BASOS PCT: 0.7 % (ref 0.0–3.0)
Basophils Absolute: 0 10*3/uL (ref 0.0–0.1)
EOS PCT: 1.9 % (ref 0.0–5.0)
Eosinophils Absolute: 0.1 10*3/uL (ref 0.0–0.7)
HEMATOCRIT: 44.3 % (ref 39.0–52.0)
HEMOGLOBIN: 14.9 g/dL (ref 13.0–17.0)
Lymphocytes Relative: 30.9 % (ref 12.0–46.0)
Lymphs Abs: 1.3 10*3/uL (ref 0.7–4.0)
MCHC: 33.7 g/dL (ref 30.0–36.0)
MCV: 91.1 fl (ref 78.0–100.0)
MONO ABS: 0.4 10*3/uL (ref 0.1–1.0)
MONOS PCT: 10.5 % (ref 3.0–12.0)
Neutro Abs: 2.4 10*3/uL (ref 1.4–7.7)
Neutrophils Relative %: 56 % (ref 43.0–77.0)
Platelets: 320 10*3/uL (ref 150.0–400.0)
RBC: 4.86 Mil/uL (ref 4.22–5.81)
RDW: 13.6 % (ref 11.5–15.5)
WBC: 4.2 10*3/uL (ref 4.0–10.5)

## 2017-09-30 LAB — LIPID PANEL
CHOLESTEROL: 168 mg/dL (ref 0–200)
HDL: 39.7 mg/dL (ref >39.00)
LDL Cholesterol: 108 mg/dL — ABNORMAL HIGH (ref 0–99)
NonHDL: 128.13
Total CHOL/HDL Ratio: 4
Triglycerides: 101 mg/dL (ref 0.0–149.0)
VLDL: 20.2 mg/dL (ref 0.0–40.0)

## 2017-09-30 LAB — COMPREHENSIVE METABOLIC PANEL
ALBUMIN: 4.2 g/dL (ref 3.5–5.2)
ALK PHOS: 56 U/L (ref 39–117)
ALT: 24 U/L (ref 0–53)
AST: 23 U/L (ref 0–37)
BUN: 12 mg/dL (ref 6–23)
CO2: 27 mEq/L (ref 19–32)
Calcium: 9.4 mg/dL (ref 8.4–10.5)
Chloride: 102 mEq/L (ref 96–112)
Creatinine, Ser: 0.96 mg/dL (ref 0.40–1.50)
GFR: 86.84 mL/min (ref >60.00)
Glucose, Bld: 99 mg/dL (ref 70–99)
POTASSIUM: 4 meq/L (ref 3.5–5.1)
Sodium: 138 mEq/L (ref 135–145)
TOTAL PROTEIN: 6.8 g/dL (ref 6.0–8.3)
Total Bilirubin: 0.9 mg/dL (ref 0.2–1.2)

## 2017-09-30 LAB — PSA: PSA: 1.21 ng/mL (ref 0.10–4.00)

## 2017-09-30 MED ORDER — CYCLOBENZAPRINE HCL 10 MG PO TABS: 10.0000 mg | ORAL_TABLET | Freq: Every evening | ORAL | 0 refills | Status: DC | PRN

## 2017-09-30 MED ORDER — PREDNISONE 10 MG PO TABS: ORAL_TABLET | ORAL | 0 refills | Status: DC

## 2017-09-30 NOTE — Progress Notes (Signed)
Subjective:    Patient ID: Adrian Schneider, male    DOB: [birth year], 51 y.o.   MRN: [id number]  DOS:  09/30/2017 Type of visit - description : cpx Interval history: He has few concerns   Review of Systems Complain of pain from the right buttock down to the posterior thigh on and off for a few months, he thinks could be related to 3 prolonged trips he had this year. Pain is the worst when he tries to stand or sit down.  No radiation beyond the posterior thigh. No actual paresthesias, no back pain per se, no edema. He had several falls this year but nothing in the last few months.  Other than above, a 14 point review of systems is negative    Past Medical History:  Diagnosis Date  . Cerebral palsy ([place])    dx at birth, has poor balance  . Hemorrhoids   . Hyperlipidemia 6/11   started meds  . Hypertension 12/2009  . Left ankle pain    MRI in 06/2015 did not show specific internal derangement, had mild degenerative chondral thinning in the tibiotalar joint w/o a focal lesion  . MVP (mitral valve prolapse)    dx years ago  . Wart    L foot, non- healing lesion, f/u by derm    Past Surgical History:  Procedure Laterality Date  . LEG SURGERY Bilateral    release muscles d/t "cerebral palsy"    Social History   Socioeconomic History  . Marital status: Single    Spouse name: Not on file  . Number of children: 0  . Years of education: Not on file  . Highest education level: Not on file  Social Needs  . Financial resource strain: Not on file  . Food insecurity - worry: Not on file  . Food insecurity - inability: Not on file  . Transportation needs - medical: Not on file  . Transportation needs - non-medical: Not on file  Occupational History  . 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 Smoking status: Never Smoker  . Smokeless tobacco: Never Used  Substance and Sexual Activity  . Alcohol use: Yes    Alcohol/week: 0.0 oz    Comment:  socially; weekly 1-2 beers weekly  . Drug use: No  . Sexual activity: Not on file  Other Topics Concern  . Not on file  Social History Narrative   Single, no children, lives by himself     Family History  Problem Relation Age of Onset  . Coronary artery disease Mother   . Hypertension Mother   . Coronary artery disease Maternal Grandmother   . Hypertension Father   . Diabetes Father   . Stroke Neg Hx   . Colon cancer Neg Hx   . Prostate cancer Neg Hx   . Esophageal cancer Neg Hx   . Stomach cancer Neg Hx   . Rectal cancer Neg Hx      Allergies as of 09/30/2017      Reactions   Dust Mite Extract    Mold Extract [trichophyton] Cough      Medication List        Accurate as of 09/30/17 11:59 PM. Always use your most recent med list.          cyclobenzaprine 10 MG tablet Commonly known as:  FLEXERIL Take 1 tablet (10 mg total) at bedtime as needed by mouth for muscle spasms.  losartan 50 MG tablet Commonly known as:  COZAAR Take 1 tablet (50 mg total) by mouth daily.   predniSONE 10 MG tablet Commonly known as:  DELTASONE 4 tablets x 2 days, 3 tabs x 2 days, 2 tabs x 2 days, 1 tab x 2 days   simvastatin 40 MG tablet Commonly known as:  ZOCOR Take 1 tablet (40 mg total) by mouth daily.          Objective:   Physical Exam  Musculoskeletal:       Legs:  BP 128/70 (BP Location: Left Arm, Patient Position: Sitting, Cuff Size: Small)   Pulse 74   Temp 98 F (36.7 C) (Oral)   Resp 14   Ht 5\' 8"  (1.727 m)   Wt 179 lb (81.2 kg)   SpO2 97%   BMI 27.22 kg/m  General:   Well developed, well nourished . NAD.  Neck: No  thyromegaly  HEENT:  Normocephalic . Face symmetric, atraumatic Lungs:  CTA B Normal respiratory effort, no intercostal retractions, no accessory muscle use. Heart: RRR,  no murmur.  No pretibial edema bilaterally  Abdomen:  Not distended, soft, non-tender. No rebound or rigidity.   DRE: Normal external examination, normal sphincter,  brown stools, prostate normal in size, not tender or nodular Skin: Exposed areas without rash. Not pale. Not jaundice Neurologic:  alert & oriented X3.  Speech normal.  Walks with some difficulty due to CP, not far from baseline MSK: At baseline Psych: Cognition and judgment appear intact.  Cooperative with normal attention span and concentration.  Behavior appropriate. No anxious or depressed appearing.     Assessment & Plan:   Assessment  Prediabetes HTN Hyperlipidemia, 2011 Cerebral palsy,   poor balance DJD - sees [facility]o prn  Sees dermatology q year, h/o groin eczema  Tinnitus ~ 11-2016, saw Dr. Cresenciano Lick  PLAN: Prediabetes: Last A1c satisfactory HTN: Seems well controlled on losartan Hyperlipidemia: On simvastatin, checking labs Cerebral palsy: Patient complains of pain at the right buttock, right thigh, could be a sciatic issue or simply related with CP.  For the acute treatment will prescribe prednisone and Flexeril.  For long-term management I would like him to get established with physical and rehabilitation medicine.  Will refer. RTC 6 months.

## 2017-09-30 NOTE — Patient Instructions (Signed)
GO TO THE LAB : Get the blood work     GO TO THE FRONT DESK Schedule your next appointment for a checkup in 6 months.  Pain:  Flexeril at bedtime as you are doing  Prednisone for a few days  IBUPROFEN (Advil or Motrin) 200 mg 2 tablets every 6 hours as needed for pain.  Always take it with food because may cause gastritis and ulcers.  If you notice nausea, stomach pain, change in the color of stools --->  Stop the medicine and let us know  Tylenol  500 mg OTC 2 tabs a day every 8 hours as needed for pain

## 2017-09-30 NOTE — Progress Notes (Signed)
Pre visit review using our clinic review tool, if applicable. No additional management support is needed unless otherwise documented below in the visit note. 

## 2017-09-30 NOTE — Assessment & Plan Note (Signed)
-  Td 02-2015; had a shingles shot last week (zostavax? Shingrix?  Asked patient to let me know); had a flu shot  -Prostate cancer screening, DRE normal today, check a PSA.  -Colon cancer screening, Cscope 2016, next per GI -Labs: CMP, FLP, CBC, PSA -diet, exercise: counseled.  Exercise limited at this point

## 2017-10-01 NOTE — Assessment & Plan Note (Signed)
Prediabetes: Last A1c satisfactory HTN: Seems well controlled on losartan Hyperlipidemia: On simvastatin, checking labs Cerebral palsy: Patient complains of pain at the right buttock, right thigh, could be a sciatic issue or simply related with CP.  For the acute treatment will prescribe prednisone and Flexeril.  For long-term management I would like him to get established with physical and rehabilitation medicine.  Will refer. RTC 6 months.

## 2017-10-08 ENCOUNTER — Encounter: Payer: Self-pay | Admitting: Internal Medicine

## 2017-10-15 ENCOUNTER — Encounter: Payer: Self-pay | Admitting: Physical Medicine & Rehabilitation

## 2017-10-18 ENCOUNTER — Other Ambulatory Visit: Payer: Self-pay

## 2017-10-18 ENCOUNTER — Ambulatory Visit (HOSPITAL_BASED_OUTPATIENT_CLINIC_OR_DEPARTMENT_OTHER): Payer: BLUE CROSS/BLUE SHIELD | Admitting: Physical Medicine & Rehabilitation

## 2017-10-18 ENCOUNTER — Encounter: Payer: Self-pay | Admitting: Physical Medicine & Rehabilitation

## 2017-10-18 ENCOUNTER — Encounter: Payer: BLUE CROSS/BLUE SHIELD | Attending: Physical Medicine & Rehabilitation

## 2017-10-18 VITALS — BP 147/87 | HR 72

## 2017-10-18 DIAGNOSIS — G801 Spastic diplegic cerebral palsy: Secondary | ICD-10-CM

## 2017-10-18 NOTE — Progress Notes (Signed)
Subjective:    Patient ID: Adrian Schneider, male    DOB: [birth year], 51 y.o.   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 General activity 3 Relation with others 3 Enjoyment of life 3 What TIME of day is your pain at its worst? varies Sleep (in general) Fair  Pain is worse with: walking and sitting Pain improves with: nothing Relief from Meds: 0  Mobility walk without assistance ability to climb steps?  yes do you drive?  yes  Function not employed: date last employed 46 I need assistance with the following:  .  Neuro/Psych No problems in this area  Prior Studies Any changes since last visit?  no  Physicians involved in your care Any changes since last visit?  no   Family History  Problem Relation Age of Onset  . Coronary artery disease Mother   . Hypertension Mother   . Coronary artery disease Maternal Grandmother   . Hypertension Father   . Diabetes Father   . Stroke Neg Hx   . Colon cancer Neg Hx   . Prostate cancer Neg Hx   . Esophageal cancer Neg Hx   . Stomach cancer Neg Hx   . Rectal cancer Neg Hx    Social History   Socioeconomic History  . Marital status: Single    Spouse name: None  . Number of children: 0  . Years of education: None  . Highest education level: None  Social Needs  . Financial resource strain: None  . Food insecurity - worry: None  . Food insecurity - inability: None    . Transportation needs - medical: None  . Transportation needs - non-medical: None  Occupational History  . 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 Smoking status: Never Smoker  . Smokeless tobacco: Never Used  Substance and Sexual Activity  . Alcohol use: Yes    Alcohol/week: 0.0 oz    Comment: socially; weekly 1-2 beers weekly  . Drug use: No  . Sexual activity: None  Other Topics Concern  . None  Social History Narrative   Single, no children, lives by himself   Past Surgical History:  Procedure Laterality Date  . LEG SURGERY Bilateral    release muscles d/t "cerebral palsy"   Past Medical History:  Diagnosis Date  . Cerebral palsy ([place])    dx at birth, has poor balance  . Hemorrhoids   . Hyperlipidemia 6/11   started meds  . Hypertension 12/2009  . Left ankle pain    MRI in 06/2015 did not show specific internal derangement, had mild degenerative chondral thinning in the tibiotalar joint w/o a focal lesion  . MVP (mitral valve prolapse)    dx years ago  . Wart    L foot, non- healing  lesion, f/u by derm   BP (!) 147/87   Pulse 72   SpO2 97%   Opioid Risk Score:  0 Fall Risk Score:  `1  Depression screen PHQ 2/9  Depression screen [facility] 2/9 10/18/2017 10/18/2017 09/30/2017 05/16/2016 08/24/2013  Decreased Interest 0 0 0 0 0  Down, Depressed, Hopeless 0 0 0 0 0  PHQ - 2 Score 0 0 0 0 0  Altered sleeping 0 - - - -  Tired, decreased energy 0 - - - -  Change in appetite 0 - - - -  Feeling bad or failure about yourself  0 - - - -  Trouble concentrating 0 - - - -  Moving slowly or fidgety/restless 0 - - - -  Suicidal thoughts 0 - - - -  PHQ-9 Score 0 - - - -  Difficult doing work/chores Not difficult at all - - - -      Review of Systems  Constitutional: Negative.   HENT: Negative.   Eyes: Negative.   Respiratory: Negative.   Cardiovascular: Negative.   Gastrointestinal: Negative.   Endocrine: Negative.   Genitourinary:  Negative.   Musculoskeletal: Negative.   Skin: Negative.   Allergic/Immunologic: Negative.   Neurological: Negative.   Hematological: Negative.   Psychiatric/Behavioral: Negative.        Objective:   Physical Exam  Constitutional: He is oriented to person, place, and time. He appears well-developed and well-nourished. No distress.  HENT:  Head: Normocephalic and atraumatic.  Eyes: Conjunctivae and EOM are normal. Pupils are equal, round, and reactive to light.  Neck: Normal range of motion.  Cardiovascular: Normal rate, regular rhythm and normal heart sounds.  No murmur heard. Pulmonary/Chest: Effort normal and breath sounds normal. No respiratory distress.  Abdominal: Soft. Bowel sounds are normal. He exhibits no distension. There is no tenderness.  Musculoskeletal: Normal range of motion. He exhibits no edema.  Neurological: He is alert and oriented to person, place, and time. Coordination and gait abnormal.  4/5 bilateral hip abd 4/5 B HF 5/5 B quad (tone enhanced) 4- bilateral ankle plantar flexors 3- bilateral ankle dorsiflexors  5/5 bilateral deltoid, bicep, tricep, grip  Tone Modified Ashworth Scale 0 bilateral upper extremities NAS 3/4 bilateral ankle plantar flexors MAS 3/4 bilateral knee extensors MAS 3 right hamstring MAS to left hamstring  Bilateral foot drag with ambulation has more foot inversion on the right side than on the left side.  Tinel's negative over the left sural and left superficial peroneal nerve at the ankle joint.   Skin: Skin is warm and dry. He is not diaphoretic.  Psychiatric: He has a normal mood and affect. His behavior is normal.  Nursing note and vitals reviewed.  There is decreased range of motion with hip internal and external rotation.       Assessment & Plan:  1.  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  He has become less physically active over the last 5-6 years. He has had increased tightness in both ankles knees  and hips. Given the degree of spasticity as well as soft tissue contracture, would recommend botulinum toxin injection 100 units to the right hamstring 100 units to each gastrocsoleus right and left.  This will be followed by outpatient therapy After his range of motion improves he can start with strengthening and progress to a community exercise program.  He also will need to do some core strengthening.  2.  Left ankle pain likely nerve stretch injury related to hyperflexion of  the ankle during gait.  Likely is stretching of the left superficial peroneal nerve.  We discussed that reducing tone at the plantar flexors as well as increasing Achilles and gastrocnemius flexibility should be helpful

## 2017-11-01 ENCOUNTER — Encounter: Payer: BLUE CROSS/BLUE SHIELD | Attending: Physical Medicine & Rehabilitation

## 2017-11-01 ENCOUNTER — Ambulatory Visit (HOSPITAL_BASED_OUTPATIENT_CLINIC_OR_DEPARTMENT_OTHER): Payer: BLUE CROSS/BLUE SHIELD | Admitting: Physical Medicine & Rehabilitation

## 2017-11-01 ENCOUNTER — Encounter: Payer: Self-pay | Admitting: Physical Medicine & Rehabilitation

## 2017-11-01 VITALS — BP 123/72 | HR 83 | Resp 14

## 2017-11-01 DIAGNOSIS — G801 Spastic diplegic cerebral palsy: Secondary | ICD-10-CM | POA: Diagnosis not present

## 2017-11-01 NOTE — Patient Instructions (Signed)

## 2017-11-01 NOTE — Progress Notes (Signed)
Botox Injection for spasticity using needle EMG guidance  Dilution: 50 Units/ml Indication: Severe spasticity which interferes with ADL,mobility and/or  hygiene and is unresponsive to medication management and other conservative care Informed consent was obtained after describing risks and benefits of the procedure with the patient. This includes bleeding, bruising, infection, excessive weakness, or medication side effects. A REMS form is on file and signed. Needle: 25g 2" needle electrode Number of units per muscle  GastrosoleusRight 100, Left 100 Right Hamstrings100 All injections were done after obtaining appropriate EMG activity and after negative drawback for blood. The patient tolerated the procedure well. Post procedure instructions were given. A followup appointment was made.   Referral to PT.

## 2017-11-06 ENCOUNTER — Ambulatory Visit: Payer: BLUE CROSS/BLUE SHIELD | Attending: Physical Medicine & Rehabilitation

## 2017-11-06 ENCOUNTER — Ambulatory Visit: Payer: BLUE CROSS/BLUE SHIELD

## 2017-11-06 DIAGNOSIS — R2689 Other abnormalities of gait and mobility: Secondary | ICD-10-CM | POA: Diagnosis not present

## 2017-11-06 DIAGNOSIS — M6281 Muscle weakness (generalized): Secondary | ICD-10-CM | POA: Insufficient documentation

## 2017-11-06 DIAGNOSIS — R278 Other lack of coordination: Secondary | ICD-10-CM | POA: Insufficient documentation

## 2017-11-06 NOTE — Therapy (Signed)
[facility] [address] [place], [place], [address] Phone: [phone]   Fax:  [phone]  Physical Therapy Evaluation  Patient Details  Name: Adrian Schneider MRN: [id number] Date of Birth: May 30, 1966 Referring Provider: Dr. Letta Pate   Encounter Date: 11/06/2017  PT End of Session - 11/06/17 1020    Visit Number  1    Number of Visits  17    Date for PT Re-Evaluation  01/05/18 BC[employer]    Authorization Type  BCBS    PT Start Time  0847    PT Stop Time  0926    PT Time Calculation (min)  39 min    Equipment Utilized During Treatment  Gait belt    Activity Tolerance  Patient tolerated treatment well    Behavior During Therapy  [facility] for tasks assessed/performed       Past Medical History:  Diagnosis Date  . 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 Hemorrhoids   . Hyperlipidemia 6/11   started meds  . Hypertension 12/2009  . Left ankle pain    MRI in 06/2015 did not show specific internal derangement, had mild degenerative chondral thinning in the tibiotalar joint w/o a focal lesion  . MVP (mitral valve prolapse)    dx years ago  . Wart    Schneider foot, non- healing lesion, f/u by derm    Past Surgical History:  Procedure Laterality Date  . LEG SURGERY Bilateral    release muscles d/t "cerebral palsy"    There were no vitals filed for this visit.   Subjective Assessment - 11/06/17 0851    Subjective  Pt has hx of CP and has always experienced balance issues but was doing fine until about 2014/2015. He was told he has RLE tendonopathy and also has nerve pain in Schneider lower leg (intermittent and pt will falls). Pt reports he used to be able to catch himself but now has a hard time righting himself. Change in surfaces and step height can cause him to fall. Pt does not wish to use a cane. Pt reported he received botox 6 days ago, in R hamstring, B calf muscles.     Pertinent History  HTN, hx of CP, HLD,  tendonitis in RLE (hip), hx of Schneider lower leg nerve pain, recent botox in BLEs (10/31/17).    Patient Stated Goals  Be able to catch himself, walk without AD    Currently in Pain?  Yes    Pain Score  3     Pain Location  Leg R hamstring    Pain Orientation  Right    Pain Descriptors / Indicators  Aching;Nagging    Pain Type  Chronic pain    Pain Onset  More than a month ago    Pain Frequency  Constant    Aggravating Factors   unsure    Pain Relieving Factors  heated seats         [facility] PT Assessment - 11/06/17 0859      Assessment   Medical Diagnosis  CP with spastic diplegia    Referring Provider  Dr. Letta Pate    Onset Date/Surgical Date  11/06/13    Hand Dominance  Right    Prior Therapy  None in adulthood      Precautions   Precautions  Fall    Precaution Comments  Pt reports he has bruised ribs (06/2017).      Restrictions  Weight Bearing Restrictions  No      Balance Screen   Has the patient fallen in the past 6 months  Yes    How many times?  A few times a week    Has the patient had a decrease in activity level because of a fear of falling?   Yes    Is the patient reluctant to leave their home because of a fear of falling?   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)    Leisure  Get back to [place], walk, play with nieces, go to events, concerts      Cognition   Overall Cognitive Status  Within Functional Limits for tasks assessed      Sensation   Light Touch  Appears Intact    Additional Comments  Pt reports shooting nerve pain on Schneider lower calf (intermittent and does not  know what provokes pain)      Coordination   Gross Motor Movements are Fluid and Coordinated  No    Fine Motor Movements are Fluid and Coordinated  No    Heel Shin Test  Slow 2/2 tight RLE muscles RAMs WNL      Tone   Assessment Location  Right Lower Extremity;Left Lower Extremity      ROM / Strength   AROM / PROM / Strength  AROM;Strength      AROM   Overall AROM   Deficits    Overall AROM Comments  BUE AROM WNL. BLE AROM WNL except for lacking approx. 5 degrees of R knee ext.       Strength   Overall Strength  Deficits    Overall Strength Comments  BLE strength: 4+/5, except for R DF: 2/5 (decr. ROM). Difficult to assess if spasticity is assisting pt during MMT.       Transfers   Transfers  Sit to Stand;Stand to Sit    Sit to Stand  6: Modified independent (Device/Increase time);With upper extremity assist;From chair/3-in-1    Stand to Sit  6: Modified independent (Device/Increase time);With upper extremity assist;To chair/3-in-1      Ambulation/Gait   Ambulation/Gait  Yes    Ambulation/Gait Assistance  5: Supervision    Ambulation/Gait Assistance Details  No pain in LLE noted. Intermittent decr. Schneider DF during gait, R foot flat.     Ambulation Distance (Feet)  100 Feet    Assistive device  None    Gait Pattern  Step-through pattern;Decreased stride length;Decreased dorsiflexion - right;Decreased dorsiflexion - left;Right hip hike;Poor foot clearance - right vaulting gait pattern.    Ambulation Surface  Level;Indoor    Gait velocity  3.57ft/sec.      Functional Gait  Assessment   Gait assessed   Yes    Gait Level Surface  Walks 20 ft in less than 7 sec but greater than 5.5 sec, uses assistive device, slower speed, mild gait deviations, or deviates 6-10 in outside of the  12 in walkway width. 6.06 sec.    Change in Gait Speed  Able to change speed, demonstrates mild gait deviations, deviates 6-10 in outside of the 12 in walkway width, or no gait deviations, unable to achieve a  major change in velocity, or uses a change in velocity, or uses an assistive device.    Gait with Horizontal Head Turns  Performs head turns smoothly with slight change in gait velocity (eg, minor disruption to smooth gait path), deviates 6-10 in outside 12 in walkway width, or uses an assistive device.    Gait with Vertical Head Turns  Performs head turns with no change in gait. Deviates no more than 6 in outside 12 in walkway width.    Gait and Pivot Turn  Pivot turns safely within 3 sec and stops quickly with no loss of balance.    Step Over Obstacle  Is able to step over one shoe box (4.5 in total height) without changing gait speed. No evidence of imbalance.    Gait with Narrow Base of Support  Ambulates less than 4 steps heel to toe or cannot perform without assistance.    Gait with Eyes Closed  Cannot walk 20 ft without assistance, severe gait deviations or imbalance, deviates greater than 15 in outside 12 in walkway width or will not attempt task. deviates > 15" outside starting point    Ambulating Backwards  Walks 20 ft, uses assistive device, slower speed, mild gait deviations, deviates 6-10 in outside 12 in walkway width.    Steps  Alternating feet, must use rail.    Total Score  18    FGA comment:  18/30: indicates pt is at a high risk for falls      RLE Tone   RLE Tone  Modified Ashworth      RLE Tone   Modified Ashworth Scale for Grading Hypertonia RLE  Considerable increase in muschle tone, passive movement difficult      LLE Tone   LLE Tone  Modified Ashworth      LLE Tone   Modified Ashworth Scale for Grading Hypertonia LLE  More marked increase in muscle tone through most of the ROM, but affected part(s) easily moved             Objective measurements completed on examination: See above findings.              PT Education - 11/06/17 1020    Education provided  Yes    Education Details  PT discussed outcome measure results. PT educated pt on PT POC,  duration, and frequency.     Person(s) Educated  Patient    Methods  Explanation    Comprehension  Verbalized understanding       PT Short Term Goals - 11/06/17 1233      PT SHORT TERM GOAL #1   Title  Pt will be IND be HEP to improve strength, flexibility, balance, and endurance. TARGET DATE FOR ALL STGS: 12/04/17    Status  New      PT SHORT TERM GOAL #2   Title  Pt will improve FGA score to >/=22/30 to decr. falls risk.     Status  New      PT SHORT TERM GOAL #3   Title  Perform 6MWT and write goals as indicated.     Status  New      PT SHORT TERM GOAL #4   Title  Pt will amb. 500' over even terrain, while  performing head turns, in order to improve safety during functional mobility.     Status  New        PT Long Term Goals - 11/06/17 1236      PT LONG TERM GOAL #1   Title  Pt will verbalize understanding of fall prevention strategies to decr. falls risk. TARGET DATE FOR ALL LTGS: 01/01/18    Status  New      PT LONG TERM GOAL #2   Title  Pt will improve FGA score to >/=26/30 to decr. falls risk.     Status  New      PT LONG TERM GOAL #3   Title  Pt will amb. 1000' over uneven terrain outdoors, at MOD I level, in order to improve safety during functional mobility.     Status  New      PT LONG TERM GOAL #4   Title  Pt will traverse 12 steps without handrail with S to safely negotiate bleacher steps at events.    Status  New      PT LONG TERM GOAL #5   Title  Trial AD and recommend appropriate device, as tolerated by pt.     Status  New             Plan - 11/06/17 1023    Clinical Impression Statement  Pt is a pleasant 51y/o male presenting to [facility] neuro with hx of CP and balance impairments. Pt's PMH is significant for the following: HTN, hx of CP, HLD, tendonitis in RLE (hip), hx of Schneider lower leg nerve pain, recent botox in BLEs (10/31/17). The following impairments were noted during exam: gait deviations, impaired balance, spasticity in BLE, decr. R knee  ROM, decr. endurance (pt reports SOB during amb. long distances), decr. flexibility, and postural dysfunction. Pt's FGA score indicates pt is at a high risk for falls. Pt's gait speed was WNL. Pt would benefit from skilled PT to improve safety during functional mobility.     History and Personal Factors relevant to plan of care:  Pt lives alone and works full time    Clinical Presentation  Evolving pt recently had botox and has intermittent Schneider lower leg nerve pain which causes pt to fall    Clinical Presentation due to:  HTN, hx of CP, HLD, tendonitis in RLE (hip), hx of Schneider lower leg nerve pain, recent botox in BLEs (10/31/17).    Clinical Decision Making  Moderate    Rehab Potential  Good    Clinical Impairments Affecting Rehab Potential  see above.     PT Frequency  2x / week    PT Duration  8 weeks    PT Treatment/Interventions  ADLs/Self Care Home Management;Biofeedback;Therapeutic exercise;Therapeutic activities;Manual techniques;Electrical Stimulation;Functional mobility training;Stair training;Gait training;Orthotic Fit/Training;Patient/family education;Neuromuscular re-education;Balance training;Vestibular    PT Next Visit Plan  Provide fall prevention handout. Provided stretching (hip and LE-so pt can donn shoes), balance, and strengthening HEP. Perform 6MWT and write goals as indicated. Perform external pertubations to improve reaction time during LOB.     Consulted and Agree with Plan of Care  Patient       Patient will benefit from skilled therapeutic intervention in order to improve the following deficits and impairments:  Abnormal gait, Decreased endurance, Decreased coordination, Impaired flexibility, Postural dysfunction, Decreased range of motion, Decreased balance, Decreased mobility, Impaired tone, Decreased knowledge of use of DME, Decreased strength  Visit Diagnosis: Other abnormalities of gait and mobility - Plan: PT plan of care  cert/re-cert  Other lack of coordination -  Plan: PT plan of care cert/re-cert  Muscle weakness (generalized) - Plan: PT plan of care cert/re-cert     Problem List Patient Active Problem List   Diagnosis Date Noted  . Cerebral palsy ([employer]) 09/30/2017  . PCP NOTES>>>>>>>>>>>>>>>>>>>> 05/16/2016  . Skin lesion 12/23/2015  . Hyperglycemia 01/27/2014  . Hemorrhoids   . Groin pain 02/08/2012  . Annual physical exam 05/21/2011  . Hyperlipidemia 11/03/2010  . BACK PAIN 02/09/2010  . Essential hypertension 12/21/2009    Adrian Schneider 11/06/2017, 12:45 PM  [facility] [address] [place], [place], [address] Phone: [phone]   Fax:  [phone]  Name: Adrian Schneider MRN: [id number] Date of Birth: [birth year]  Geoffry Paradise, PT,DPT 11/06/17 12:45 PM Phone: [phone] Fax: [phone]

## 2017-11-09 ENCOUNTER — Other Ambulatory Visit: Payer: Self-pay | Admitting: Internal Medicine

## 2017-11-15 ENCOUNTER — Telehealth: Payer: Self-pay | Admitting: *Deleted

## 2017-11-15 NOTE — Telephone Encounter (Signed)
Patient has a concern if Botox is going to work or not. l Pain has returned to his foot again in places were the injections were performed.  He is anxious if this is an indicator of medication failure or if it needs a little time to work itself in.  He says the pain is the same as the pain he experienced prior to coming in to see [place]. He says feeling a little disheartened and wonders if the medication is not going to work.

## 2017-11-18 NOTE — Telephone Encounter (Signed)
botox needs to work in conjunction with stretching and therapy and may take several rounds to achieve full effect

## 2017-11-22 ENCOUNTER — Ambulatory Visit: Payer: BLUE CROSS/BLUE SHIELD | Admitting: Rehabilitation

## 2017-11-22 NOTE — Telephone Encounter (Signed)
Contacted patient, reassured patient with Dr. Letta Pate message

## 2017-11-25 ENCOUNTER — Encounter: Payer: Self-pay | Admitting: Rehabilitation

## 2017-11-25 ENCOUNTER — Ambulatory Visit: Payer: BLUE CROSS/BLUE SHIELD | Attending: Physical Medicine & Rehabilitation | Admitting: Rehabilitation

## 2017-11-25 DIAGNOSIS — R2689 Other abnormalities of gait and mobility: Secondary | ICD-10-CM | POA: Insufficient documentation

## 2017-11-25 DIAGNOSIS — M6281 Muscle weakness (generalized): Secondary | ICD-10-CM

## 2017-11-25 DIAGNOSIS — R278 Other lack of coordination: Secondary | ICD-10-CM | POA: Insufficient documentation

## 2017-11-25 NOTE — Therapy (Signed)
[facility] [address] [place], [place], [address] Phone: [phone]   Fax:  [phone]  Physical Therapy Treatment  Patient Details  Name: Adrian Schneider MRN: [id number] Date of Birth: 1966/09/25 Referring Provider: Dr. Letta Pate   Encounter Date: 11/25/2017  PT End of Session - 11/25/17 1229    Visit Number  2    Number of Visits  17    Date for PT Re-Evaluation  01/05/18 [employer]    Authorization Type  BCBS    PT Start Time  0933    PT Stop Time  1016    PT Time Calculation (min)  43 min    Equipment Utilized During Treatment  Gait belt    Activity Tolerance  Patient tolerated treatment well    Behavior During Therapy  [facility] for tasks assessed/performed       Past Medical History:  Diagnosis Date  . 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 Hemorrhoids   . Hyperlipidemia 6/11   started meds  . Hypertension 12/2009  . Left ankle pain    MRI in 06/2015 did not show specific internal derangement, had mild degenerative chondral thinning in the tibiotalar joint w/o a focal lesion  . MVP (mitral valve prolapse)    dx years ago  . Wart    L foot, non- healing lesion, f/u by derm    Past Surgical History:  Procedure Laterality Date  . LEG SURGERY Bilateral    release muscles d/t "cerebral palsy"    There were no vitals filed for this visit.  Subjective Assessment - 11/25/17 0939    Subjective  Pt reports botox hasn't helped as much as he would have liked.  Note near fall on 12/27, caught himself.     Pertinent History  HTN, hx of CP, HLD, tendonitis in RLE (hip), hx of L lower leg nerve pain, recent botox in BLEs (10/31/17).    Patient Stated Goals  Be able to catch himself, walk without AD    Currently in Pain?  Yes    Pain Score  2     Pain Location  Leg R hamstring    Pain Orientation  Right    Pain Descriptors / Indicators  Aching;Nagging    Pain Type  Chronic pain    Pain Onset  More  than a month ago    Pain Frequency  Constant    Aggravating Factors   unsure    Pain Relieving Factors  heated seats                      [facility] Adult PT Treatment/Exercise - 11/25/17 0001      Ambulation/Gait   Ambulation/Gait  Yes    Ambulation/Gait Assistance  5: Supervision    Ambulation/Gait Assistance Details  Assessed gait during session to better understand fall risk.  Note that pt tends to ambulate in plantar flexed position and states that he often gets L foot caught and falls with sharp stretch on the front of L ankle.  Educated on use of bracing to help with some of this.  Pt not wanting to have braces at this time.  Educated on toe cap and demonstrated gait with this with use of surgical shoe cover.  Pt notes slight improvement, but was not completely willing to modify shoes.  Education on how to do so if interested.  Pt verbalized understanding.  x 2 reps  Ambulation Distance (Feet)  115 Feet x 2 reps    Assistive device  None    Gait Pattern  Step-through pattern;Decreased stride length;Decreased dorsiflexion - right;Decreased dorsiflexion - left;Right hip hike;Poor foot clearance - right    Ambulation Surface  Level;Indoor      Self-Care   Self-Care  Other Self-Care Comments    Other Self-Care Comments   Education on importance of stretching daily in addition to botox.  Also educated on botox vs nerve block and speaking with Dr. Letta Pate at follow up visit to let him know what can be done next.  Educated on importance of daily stretching and prognosis with fall risk due to deficits from CP.  Max education on posture at work as he tends to sit with extremely slouched posture and posterior pelvic tilt.  Demonstrated how to better sit with weight towards legs and upright posture with use of lumbar support if needed.  Pt verbalized understanding.        Exercises   Exercises  Other Exercises    Other Exercises   Had pt try to sit and do piriformis stretch, however  pt unable to do so at this time, therefore had pt lie down and use gait belt to self assist into crossed leg position with better outcome.  Provided this for HEP and did on each side x 1 min.  Then performed supine hamstring stretch with strap with education on lowering leg to ensure more knee extension and graually building up.  Performed on each side x 1-2 mins.  Had pt demonstrate calf stretch on stairs to PT.  Pt performed well and therefore recommend he do this daily without doing heel lifts to avoid further contracture of B plantar flexors.  Pt verbalized understanding, see pt instructions for details.               PT Education - 11/25/17 1229    Education provided  Yes    Education Details  see self care, HEP    Person(s) Educated  Patient    Methods  Explanation;Demonstration;Handout;Tactile cues;Verbal cues    Comprehension  Verbalized understanding;Returned demonstration;Verbal cues required;Tactile cues required;Need further instruction       PT Short Term Goals - 11/06/17 1233      PT SHORT TERM GOAL #1   Title  Pt will be IND be HEP to improve strength, flexibility, balance, and endurance. TARGET DATE FOR ALL STGS: 12/04/17    Status  New      PT SHORT TERM GOAL #2   Title  Pt will improve FGA score to >/=22/30 to decr. falls risk.     Status  New      PT SHORT TERM GOAL #3   Title  Perform 6MWT and write goals as indicated.     Status  New      PT SHORT TERM GOAL #4   Title  Pt will amb. 500' over even terrain, while performing head turns, in order to improve safety during functional mobility.     Status  New        PT Long Term Goals - 11/06/17 1236      PT LONG TERM GOAL #1   Title  Pt will verbalize understanding of fall prevention strategies to decr. falls risk. TARGET DATE FOR ALL LTGS: 01/01/18    Status  New      PT LONG TERM GOAL #2   Title  Pt will improve FGA score to >/=26/30 to decr. falls  risk.     Status  New      PT LONG TERM GOAL #3    Title  Pt will amb. 1000' over uneven terrain outdoors, at MOD I level, in order to improve safety during functional mobility.     Status  New      PT LONG TERM GOAL #4   Title  Pt will traverse 12 steps without handrail with S to safely negotiate bleacher steps at events.    Status  New      PT LONG TERM GOAL #5   Title  Trial AD and recommend appropriate device, as tolerated by pt.     Status  New            Plan - 11/25/17 1230    Clinical Impression Statement  Skilled session spent a great amount of time with education on importance of stretching BLEs in addition to botox, having discussion with Dr. Letta Pate whether botox is working or not and next steps, ergonomics when sitting at work, and progression of CP deficits.  Provided pt with initial stretching program and showed him toe cap to assist with L foot clearance, however he did not wish to pursue this at this time.     Rehab Potential  Good    Clinical Impairments Affecting Rehab Potential  see above.     PT Frequency  2x / week    PT Duration  8 weeks    PT Treatment/Interventions  ADLs/Self Care Home Management;Biofeedback;Therapeutic exercise;Therapeutic activities;Manual techniques;Electrical Stimulation;Functional mobility training;Stair training;Gait training;Orthotic Fit/Training;Patient/family education;Neuromuscular re-education;Balance training;Vestibular    PT Next Visit Plan  Provide fall prevention handout. Continue stretching, balance, and strengthening HEP. Perform 6MWT and write goals as indicated. Perform external pertubations to improve reaction time during LOB.     Consulted and Agree with Plan of Care  Patient       Patient will benefit from skilled therapeutic intervention in order to improve the following deficits and impairments:  Abnormal gait, Decreased endurance, Decreased coordination, Impaired flexibility, Postural dysfunction, Decreased range of motion, Decreased balance, Decreased mobility,  Impaired tone, Decreased knowledge of use of DME, Decreased strength  Visit Diagnosis: Other abnormalities of gait and mobility  Other lack of coordination  Muscle weakness (generalized)     Problem List Patient Active Problem List   Diagnosis Date Noted  . Cerebral palsy ([place]) 09/30/2017  . PCP NOTES>>>>>>>>>>>>>>>>>>>> 05/16/2016  . Skin lesion 12/23/2015  . Hyperglycemia 01/27/2014  . Hemorrhoids   . Groin pain 02/08/2012  . Annual physical exam 05/21/2011  . Hyperlipidemia 11/03/2010  . BACK PAIN 02/09/2010  . Essential hypertension 12/21/2009    Cameron Sprang, PT, MPT [facility] [address] [place], [place], [address] Phone: [phone]   Fax:  [phone] 11/25/17, 12:33 PM  Name: Adrian Schneider MRN: [id number] Date of Birth: 1966-02-02

## 2017-11-25 NOTE — Patient Instructions (Signed)
Piriformis Stretch, Standing: Yoga Crossed Leg Chair    Do this lying down!! Use strap if needed to place around your leg and cross leg over opposite leg and hold.  Hold for 2-3 mins at least and do both sides 2-3 mins.   Copyright  VHI. All rights reserved.   Gastroc / Heel Cord Stretch - On Step    Stand with heels over edge of stair. Holding rail, lower heels until stretch is felt in calf of legs.  Hold for 1-2 mins.  Do 2-3 times, 2x per day.    Copyright  VHI. All rights reserved.   Remember sitting at your desk.  Have feet flat on the floor or propped on block, knees and hips close to 90 degrees.  Sit towards edge of chair with more weight on legs.  Can add pillow or towel roll to low back in chair to help posture.

## 2017-11-28 ENCOUNTER — Encounter: Payer: Self-pay | Admitting: Rehabilitation

## 2017-11-28 ENCOUNTER — Ambulatory Visit: Payer: BLUE CROSS/BLUE SHIELD | Admitting: Rehabilitation

## 2017-11-28 DIAGNOSIS — M6281 Muscle weakness (generalized): Secondary | ICD-10-CM | POA: Diagnosis not present

## 2017-11-28 DIAGNOSIS — R2689 Other abnormalities of gait and mobility: Secondary | ICD-10-CM | POA: Diagnosis not present

## 2017-11-28 DIAGNOSIS — R278 Other lack of coordination: Secondary | ICD-10-CM | POA: Diagnosis not present

## 2017-11-28 NOTE — Patient Instructions (Signed)
HIP: Abduction / External Rotation - Side-Lying    Lie on side with hip and knees bent. Raise top knee up, squeezing glutes. Keep ankles together. __10_ reps per set, _2__ sets per day, _5-7__ days per week   Copyright  VHI. All rights reserved.   Straight Leg Raise    Tighten stomach and slowly raise locked right leg __5-6__ inches from floor.  Make sure you lower slowly. Repeat _10___ times per set. Do __1__ sets per session. Do __2__ sessions per day.  http://orth.exer.us/1102   Copyright  VHI. All rights reserved.

## 2017-11-28 NOTE — Therapy (Signed)
[facility] [address] [place], [place], [address] Phone: [phone]   Fax:  [phone]  Physical Therapy Treatment  Patient Details  Name: Adrian Schneider MRN: [id number] Date of Birth: February 01, 1966 Referring Provider: Dr. Letta Pate   Encounter Date: 11/28/2017  PT End of Session - 11/28/17 1020    Visit Number  3    Number of Visits  17    Date for PT Re-Evaluation  01/05/18 [employer]    Authorization Type  BCBS    PT Start Time  1017    PT Stop Time  1100    PT Time Calculation (min)  43 min    Equipment Utilized During Treatment  Gait belt    Activity Tolerance  Patient tolerated treatment well    Behavior During Therapy  WFL for tasks assessed/performed       Past Medical History:  Diagnosis Date  . 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 Hemorrhoids   . Hyperlipidemia 6/11   started meds  . Hypertension 12/2009  . Left ankle pain    MRI in 06/2015 did not show specific internal derangement, had mild degenerative chondral thinning in the tibiotalar joint w/o a focal lesion  . MVP (mitral valve prolapse)    dx years ago  . Wart    L foot, non- healing lesion, f/u by derm    Past Surgical History:  Procedure Laterality Date  . LEG SURGERY Bilateral    release muscles d/t "cerebral palsy"    There were no vitals filed for this visit.  Subjective Assessment - 11/28/17 1018    Subjective  Pt reports wanting new pillow for chair to have more support.  No falls or trips.     Pertinent History  HTN, hx of CP, HLD, tendonitis in RLE (hip), hx of L lower leg nerve pain, recent botox in BLEs (10/31/17).    Patient Stated Goals  Be able to catch himself, walk without AD    Currently in Pain?  No/denies         [facility] PT Assessment - 11/28/17 0001      6 Minute Walk- Baseline   6 Minute Walk- Baseline  yes    BP (mmHg)  144/90    HR (bpm)  78    02 Sat (%RA)  94 %    Modified Borg  Scale for Dyspnea  0- Nothing at all    Perceived Rate of Exertion (Borg)  6-      6 Minute walk- Post Test   6 Minute Walk Post Test  yes    BP (mmHg)  146/96  (Abnormal)     HR (bpm)  115    02 Sat (%RA)  96 %    Modified Borg Scale for Dyspnea  5- Strong or hard breathing    Perceived Rate of Exertion (Borg)  13- Somewhat hard      6 minute walk test results    Aerobic Endurance Distance Walked  1383    Endurance additional comments  without device                   [facility] Adult PT Treatment/Exercise - 11/28/17 0001      Self-Care   Self-Care  Other Self-Care Comments    Other Self-Care Comments   Educated on 6MWT results and how to begin walking program in order to build endurance.  Pt stating he can walk  on treadmill at gym.  Educated on walking speed to ensure he does not have fall.  Pt verbalized understanding.        Exercises   Exercises  Other Exercises    Other Exercises   Provided pt with hip strengthening exercises during session.  See pt instruction for details on exercises and reps performed.  Assessed seated ankle DF in which he was able to do, however unable to perform in standing, therefore recommend he do in sitting.  Performed standing calf stretch at counter top x 2 mins on each side.  Supine assisted hamstring stretch x 2 mins on each side.               PT Education - 11/28/17 1020    Education provided  Yes    Education Details  See self care    Person(s) Educated  Patient    Methods  Explanation    Comprehension  Verbalized understanding       PT Short Term Goals - 11/06/17 1233      PT SHORT TERM GOAL #1   Title  Pt will be IND be HEP to improve strength, flexibility, balance, and endurance. TARGET DATE FOR ALL STGS: 12/04/17    Status  New      PT SHORT TERM GOAL #2   Title  Pt will improve FGA score to >/=22/30 to decr. falls risk.     Status  New      PT SHORT TERM GOAL #3   Title  Perform 6MWT and write goals as indicated.      Status  New      PT SHORT TERM GOAL #4   Title  Pt will amb. 500' over even terrain, while performing head turns, in order to improve safety during functional mobility.     Status  New        PT Long Term Goals - 11/06/17 1236      PT LONG TERM GOAL #1   Title  Pt will verbalize understanding of fall prevention strategies to decr. falls risk. TARGET DATE FOR ALL LTGS: 01/01/18    Status  New      PT LONG TERM GOAL #2   Title  Pt will improve FGA score to >/=26/30 to decr. falls risk.     Status  New      PT LONG TERM GOAL #3   Title  Pt will amb. 1000' over uneven terrain outdoors, at MOD I level, in order to improve safety during functional mobility.     Status  New      PT LONG TERM GOAL #4   Title  Pt will traverse 12 steps without handrail with S to safely negotiate bleacher steps at events.    Status  New      PT LONG TERM GOAL #5   Title  Trial AD and recommend appropriate device, as tolerated by pt.     Status  New            Plan - 11/28/17 1223    Clinical Impression Statement  Skilled session addressed strengthening for BLEs, continued BLE flexibility and also formally assessed functional endurance with 6MWT.  Note distance of approx 1400' which is below normal of approx 1900' for his age group.  Educated on walking program to address deficits.      Rehab Potential  Good    Clinical Impairments Affecting Rehab Potential  see above.     PT Frequency  2x / week    PT Duration  8 weeks    PT Treatment/Interventions  ADLs/Self Care Home Management;Biofeedback;Therapeutic exercise;Therapeutic activities;Manual techniques;Electrical Stimulation;Functional mobility training;Stair training;Gait training;Orthotic Fit/Training;Patient/family education;Neuromuscular re-education;Balance training;Vestibular    PT Next Visit Plan  Provide fall prevention handout. Continue stretching, balance, and strengthening HEP.  Perform external pertubations to improve reaction time  during LOB.     Consulted and Agree with Plan of Care  Patient       Patient will benefit from skilled therapeutic intervention in order to improve the following deficits and impairments:  Abnormal gait, Decreased endurance, Decreased coordination, Impaired flexibility, Postural dysfunction, Decreased range of motion, Decreased balance, Decreased mobility, Impaired tone, Decreased knowledge of use of DME, Decreased strength  Visit Diagnosis: Other lack of coordination  Other abnormalities of gait and mobility  Muscle weakness (generalized)     Problem List Patient Active Problem List   Diagnosis Date Noted  . Cerebral palsy ([facility]) 09/30/2017  . PCP NOTES>>>>>>>>>>>>>>>>>>>> 05/16/2016  . Skin lesion 12/23/2015  . Hyperglycemia 01/27/2014  . Hemorrhoids   . Groin pain 02/08/2012  . Annual physical exam 05/21/2011  . Hyperlipidemia 11/03/2010  . BACK PAIN 02/09/2010  . Essential hypertension 12/21/2009   Cameron Sprang, PT, MPT [facility] [address] [place], [place], [address] Phone: [phone]   Fax:  [phone] 11/28/17, 12:28 PM  Name: MAHER SHON Schneider MRN: [id number] Date of Birth: January 01, 1966

## 2017-12-02 ENCOUNTER — Encounter: Payer: Self-pay | Admitting: Rehabilitation

## 2017-12-02 ENCOUNTER — Ambulatory Visit: Payer: BLUE CROSS/BLUE SHIELD | Admitting: Rehabilitation

## 2017-12-02 DIAGNOSIS — R278 Other lack of coordination: Secondary | ICD-10-CM | POA: Diagnosis not present

## 2017-12-02 DIAGNOSIS — M6281 Muscle weakness (generalized): Secondary | ICD-10-CM

## 2017-12-02 DIAGNOSIS — R2689 Other abnormalities of gait and mobility: Secondary | ICD-10-CM

## 2017-12-02 NOTE — Patient Instructions (Addendum)
HIP: Abduction / External Rotation - Side-Lying    Lie on side with hip and knees bent. Raise top knee up, squeezing glutes. Keep ankles together. __10_ reps per set, _2__ sets per day, _5-7__ days per week   Copyright  VHI. All rights reserved.   Straight Leg Raise    Tighten stomach and slowly raise locked right leg __5-6__ inches from floor.  Make sure you lower slowly. Repeat _10___ times per set. Do __1__ sets per session. Do __2__ sessions per day.  http://orth.exer.us/1102   Copyright  VHI. All rights reserved.        Joaquin Bend with Hamstring Curl  Lie down on your back with your feet on the ball and your knees straight. Lift your hips off the table and curl the ball towards your body. Straighten your legs back out and lower your body to the table.  Do 8-10 times, try to do 2x per day.      Core hooklying marching opposite leg  Don's worry about holding a ball, lie on your back with knees bent.  Slowly lift opposite foot 6 inches off the surface, keeping knee bent. Slowly return foot to surface.  Let heel touch only briefly before raising opposite leg.  Progress by having no touch of heel.  Do 10 times, 2 times per day if possible.

## 2017-12-02 NOTE — Therapy (Signed)
[facility] [address] [place], [place], [address] Phone: [phone]   Fax:  [phone]  Physical Therapy Treatment  Patient Details  Name: Adrian Schneider MRN: [id number] Date of Birth: July 14, 1966 Referring Provider: Dr. Letta Pate   Encounter Date: 12/02/2017  PT End of Session - 12/02/17 1304    Visit Number  4    Number of Visits  17    Date for PT Re-Evaluation  01/05/18 [employer]    Authorization Type  BCBS    PT Start Time  [phone]    PT Stop Time  1015    PT Time Calculation (min)  44 min    Equipment Utilized During Treatment  Gait belt    Activity Tolerance  Patient tolerated treatment well    Behavior During Therapy  Adrian Schneider for tasks assessed/performed       Past Medical History:  Diagnosis Date  . 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 Hemorrhoids   . Hyperlipidemia 6/11   started meds  . Hypertension 12/2009  . Left ankle pain    MRI in 06/2015 did not show specific internal derangement, had mild degenerative chondral thinning in the tibiotalar joint w/o a focal lesion  . MVP (mitral valve prolapse)    dx years ago  . Wart    L foot, non- healing lesion, f/u by derm    Past Surgical History:  Procedure Laterality Date  . LEG SURGERY Bilateral    release muscles d/t "cerebral palsy"    There were no vitals filed for this visit.  Subjective Assessment - 12/02/17 0933    Subjective  Pt reports still feeling very stiff but does report small improvement.      Pertinent History  HTN, hx of CP, HLD, tendonitis in RLE (hip), hx of L lower leg nerve pain, recent botox in BLEs (10/31/17).    Patient Stated Goals  Be able to catch himself, walk without AD    Currently in Pain?  No/denies                      [facility] Adult PT Treatment/Exercise - 12/02/17 0001      Self-Care   Self-Care  Other Self-Care Comments    Other Self-Care Comments   Educated on safe return to the  gym.        Exercises   Exercises  Other Exercises    Other Exercises   Exercises to address core strength and BLE strength; hooklying marching x 10 reps with light heel touch only (provided for HEP), supine BLE bridging x 10, seated on physioball maintaining balance>marching x 10 reps>LAQs x 10 reps on each side>holding 3lb weighted ball moving in diagonal patterns x 10 each direction>marching w/ opposite side lateral movement with weighted ball x 10 reps.  Note increased difficulty with last exercise.  Pt with physioball at home and to try some of these exercises.  Performed B LE bridging on physioball x 10 reps each progressing to bridging on ball with hamstring curls x 10 reps.  Did provide this for HEP as he is going to begin going to gym and can do there.  Pt wanting to go over SL clam exercise from previous session therefore performed x 10 reps on each side with cues for technique and to prevent posterior trunk movement.               PT Education -  12/02/17 0935    Education provided  Yes    Education Details  return to gym    Person(s) Educated  Patient    Methods  Explanation    Comprehension  Verbalized understanding       PT Short Term Goals - 11/06/17 1233      PT SHORT TERM GOAL #1   Title  Pt will be IND be HEP to improve strength, flexibility, balance, and endurance. TARGET DATE FOR ALL STGS: 12/04/17    Status  New      PT SHORT TERM GOAL #2   Title  Pt will improve FGA score to >/=22/30 to decr. falls risk.     Status  New      PT SHORT TERM GOAL #3   Title  Perform 6MWT and write goals as indicated.     Status  New      PT SHORT TERM GOAL #4   Title  Pt will amb. 500' over even terrain, while performing head turns, in order to improve safety during functional mobility.     Status  New        PT Long Term Goals - 11/06/17 1236      PT LONG TERM GOAL #1   Title  Pt will verbalize understanding of fall prevention strategies to decr. falls risk. TARGET  DATE FOR ALL LTGS: 01/01/18    Status  New      PT LONG TERM GOAL #2   Title  Pt will improve FGA score to >/=26/30 to decr. falls risk.     Status  New      PT LONG TERM GOAL #3   Title  Pt will amb. 1000' over uneven terrain outdoors, at MOD I level, in order to improve safety during functional mobility.     Status  New      PT LONG TERM GOAL #4   Title  Pt will traverse 12 steps without handrail with S to safely negotiate bleacher steps at events.    Status  New      PT LONG TERM GOAL #5   Title  Trial AD and recommend appropriate device, as tolerated by pt.     Status  New            Plan - 12/02/17 1305    Clinical Impression Statement  Session continues to focus on BLE and core strengthening to carryover to improved gait pattern.  Pt making good progress towards goals, however still feel he will need continued botox and possible bracing in the future.     Rehab Potential  Good    Clinical Impairments Affecting Rehab Potential  see above.     PT Frequency  2x / week    PT Duration  8 weeks    PT Treatment/Interventions  ADLs/Self Care Home Management;Biofeedback;Therapeutic exercise;Therapeutic activities;Manual techniques;Electrical Stimulation;Functional mobility training;Stair training;Gait training;Orthotic Fit/Training;Patient/family education;Neuromuscular re-education;Balance training;Vestibular    PT Next Visit Plan  Provide fall prevention handout. Continue stretching, balance, and strengthening HEP.  Perform external pertubations to improve reaction time during LOB.     Consulted and Agree with Plan of Care  Patient       Patient will benefit from skilled therapeutic intervention in order to improve the following deficits and impairments:  Abnormal gait, Decreased endurance, Decreased coordination, Impaired flexibility, Postural dysfunction, Decreased range of motion, Decreased balance, Decreased mobility, Impaired tone, Decreased knowledge of use of DME, Decreased  strength  Visit Diagnosis: Other lack of coordination  Other abnormalities of gait and mobility  Muscle weakness (generalized)     Problem List Patient Active Problem List   Diagnosis Date Noted  . Cerebral palsy ([place]) 09/30/2017  . PCP NOTES>>>>>>>>>>>>>>>>>>>> 05/16/2016  . Skin lesion 12/23/2015  . Hyperglycemia 01/27/2014  . Hemorrhoids   . Groin pain 02/08/2012  . Annual physical exam 05/21/2011  . Hyperlipidemia 11/03/2010  . BACK PAIN 02/09/2010  . Essential hypertension 12/21/2009    Cameron Sprang, PT, MPT [facility] [address] [place], [place], [address] Phone: [phone]   Fax:  [phone] 12/02/17, 1:12 PM  Name: Adrian Schneider MRN: [id number] Date of Birth: 05/09/1966

## 2017-12-05 ENCOUNTER — Ambulatory Visit: Payer: BLUE CROSS/BLUE SHIELD | Admitting: Physical Therapy

## 2017-12-05 ENCOUNTER — Encounter: Payer: Self-pay | Admitting: Physical Therapy

## 2017-12-05 DIAGNOSIS — R278 Other lack of coordination: Secondary | ICD-10-CM

## 2017-12-05 DIAGNOSIS — M6281 Muscle weakness (generalized): Secondary | ICD-10-CM | POA: Diagnosis not present

## 2017-12-05 DIAGNOSIS — R2689 Other abnormalities of gait and mobility: Secondary | ICD-10-CM
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
## 2017-12-09 ENCOUNTER — Ambulatory Visit: Payer: BLUE CROSS/BLUE SHIELD | Admitting: Rehabilitation

## 2017-12-09 ENCOUNTER — Encounter: Payer: Self-pay | Admitting: Rehabilitation

## 2017-12-09 DIAGNOSIS — M6281 Muscle weakness (generalized): Secondary | ICD-10-CM | POA: Diagnosis not present

## 2017-12-09 DIAGNOSIS — R278 Other lack of coordination: Secondary | ICD-10-CM | POA: Diagnosis not present

## 2017-12-09 DIAGNOSIS — R2689 Other abnormalities of gait and mobility: Secondary | ICD-10-CM

## 2017-12-09 NOTE — Therapy (Signed)
[facility] [address] [place], [place], [address] Phone: [phone]   Fax:  [phone]  Physical Therapy Treatment  Patient Details  Name: Adrian Schneider MRN: [id number] Date of Birth: 12/01/1965 Referring Provider: Dr. Letta Pate   Encounter Date: 12/09/2017  PT End of Session - 12/09/17 0936    Visit Number  6    Number of Visits  17    Date for PT Re-Evaluation  01/05/18 [employer]    Authorization Type  BCBS    PT Start Time  0932    PT Stop Time  1015    PT Time Calculation (min)  43 min    Activity Tolerance  Patient tolerated treatment well    Behavior During Therapy  [facility] for tasks assessed/performed       Past Medical History:  Diagnosis Date  . Cerebral palsy ([place])    dx at birth, has poor balance  . Hemorrhoids   . Hyperlipidemia 6/11   started meds  . Hypertension 12/2009  . Left ankle pain    MRI in 06/2015 did not show specific internal derangement, had mild degenerative chondral thinning in the tibiotalar joint w/o a focal lesion  . MVP (mitral valve prolapse)    dx years ago  . Wart    L foot, non- healing lesion, f/u by derm    Past Surgical History:  Procedure Laterality Date  . LEG SURGERY Bilateral    release muscles d/t "cerebral palsy"    There were no vitals filed for this visit.  Subjective Assessment - 12/09/17 0934    Subjective  Has not had any shocks since last visit.      Pertinent History  HTN, hx of CP, HLD, tendonitis in RLE (hip), hx of L lower leg nerve pain, recent botox in BLEs (10/31/17).    Patient Stated Goals  Be able to catch himself, walk without AD    Currently in Pain?  No/denies                      [facility] Adult PT Treatment/Exercise - 12/09/17 0001      Neuro Re-ed    Neuro Re-ed Details   High level balance exercise to challenge balance strategies and perturbations with resisted walking forwards and sidways (both ways) x 115' forwards and  52' each direction sideways.  Pt able to self correct and LOB with stepping strategy at S level.        Exercises   Exercises  Other Exercises    Other Exercises   B calf stretch on stairs x 2 reps of 2 mins, heel walking along counter top for improved DF activation during gait x 4 reps along counter, wall squats with ball squeeze for improved BLE strength (esp adductors) x 10 reps with 5 sec holds, sit<>stand x 10 reps with emphasis on improved technique with more forward weight shift and controlled sitting.  Recommended these for home.  Pt verbalized understanding.       Knee/Hip Exercises: Aerobic   Elliptical  Level 1, no resistance, 2 min warm up, 2 min workout, 2 min cool down.  Pt continues to fatigue very quickly during task.               PT Education - 12/09/17 0935    Education provided  Yes    Education Details  adding wall squats and sit<>stand to HEP    Person(s) Educated  Patient  Methods  Explanation    Comprehension  Verbalized understanding       PT Short Term Goals - 12/05/17 1025      PT SHORT TERM GOAL #1   Title  Pt will be IND be HEP to improve strength, flexibility, balance, and endurance. TARGET DATE FOR ALL STGS: 12/04/17    Status  On-going      PT SHORT TERM GOAL #2   Title  Pt will improve FGA score to >/=22/30 to decr. falls risk.     Baseline  21/30    Status  Partially Met      PT SHORT TERM GOAL #3   Title  Perform 6MWT and write goals as indicated.     Baseline  met    Status  Achieved      PT SHORT TERM GOAL #4   Title  Pt will amb. 500' over even terrain, while performing head turns, in order to improve safety during functional mobility.     Baseline  supervision due to veering, pt with significant SOB    Status  Partially Met        PT Long Term Goals - 12/05/17 1421      PT LONG TERM GOAL #1   Title  Pt will verbalize understanding of fall prevention strategies to decr. falls risk. TARGET DATE FOR ALL LTGS: 01/01/18    Status   On-going      PT LONG TERM GOAL #2   Title  Pt will improve FGA score to >/=24/30 to decr. falls risk.     Baseline  21/30 at 4 weeks    Status  Revised      PT LONG TERM GOAL #3   Title  Pt will amb. 1000' over uneven terrain outdoors, at MOD I level, in order to improve safety during functional mobility.     Status  On-going      PT LONG TERM GOAL #4   Title  Pt will traverse 12 steps without handrail with S to safely negotiate bleacher steps at events.    Status  On-going      PT LONG TERM GOAL #5   Title  Trial AD and recommend appropriate device, as tolerated by pt.     Status  On-going      Additional Long Term Goals   Additional Long Term Goals  Yes      PT LONG TERM GOAL #6   Title  Pt will improve ambulation endurance and distance on 6 minute walk test by 150 feet rating 12-13 on BORG RPE    Baseline  1383'; 13 on RPE    Status  New            Plan - 12/09/17 1625    Clinical Impression Statement  Skilled session continues to focus on BLE flexibility, strengthening, and overall endurance.  Pt continues to demonstrate extreme fatigue following elliptical exercise.  Also began working on external perturbations with resisted gait.  Pt did very well activiating stepping strategy.      Rehab Potential  Good    Clinical Impairments Affecting Rehab Potential  see above.     PT Frequency  2x / week    PT Duration  8 weeks    PT Treatment/Interventions  ADLs/Self Care Home Management;Biofeedback;Therapeutic exercise;Therapeutic activities;Manual techniques;Electrical Stimulation;Functional mobility training;Stair training;Gait training;Orthotic Fit/Training;Patient/family education;Neuromuscular re-education;Balance training;Vestibular    PT Next Visit Plan  Bioness LLE lower cuff; endurance on elliptical, Provide fall prevention handout. Continue stretching,  balance, and strengthening HEP.  Perform external pertubations to improve reaction time during LOB.     Consulted  and Agree with Plan of Care  Patient       Patient will benefit from skilled therapeutic intervention in order to improve the following deficits and impairments:  Abnormal gait, Decreased endurance, Decreased coordination, Impaired flexibility, Postural dysfunction, Decreased range of motion, Decreased balance, Decreased mobility, Impaired tone, Decreased knowledge of use of DME, Decreased strength  Visit Diagnosis: Other lack of coordination  Other abnormalities of gait and mobility  Muscle weakness (generalized)     Problem List Patient Active Problem List   Diagnosis Date Noted  . Cerebral palsy ([place]) 09/30/2017  . PCP NOTES>>>>>>>>>>>>>>>>>>>> 05/16/2016  . Skin lesion 12/23/2015  . Hyperglycemia 01/27/2014  . Hemorrhoids   . Groin pain 02/08/2012  . Annual physical exam 05/21/2011  . Hyperlipidemia 11/03/2010  . BACK PAIN 02/09/2010  . Essential hypertension 12/21/2009    Cameron Sprang, PT, MPT [facility] [address] [place], [place], [address] Phone: [phone]   Fax:  [phone] 12/09/17, 4:29 PM  Name: Adrian Schneider MRN: [id number] Date of Birth: 21-May-1966

## 2017-12-12 ENCOUNTER — Encounter: Payer: Self-pay | Admitting: Rehabilitation

## 2017-12-12 ENCOUNTER — Ambulatory Visit: Payer: BLUE CROSS/BLUE SHIELD | Admitting: Rehabilitation

## 2017-12-12 DIAGNOSIS — R2689 Other abnormalities of gait and mobility: Secondary | ICD-10-CM

## 2017-12-12 DIAGNOSIS — R278 Other lack of coordination: Secondary | ICD-10-CM

## 2017-12-12 DIAGNOSIS — M6281 Muscle weakness (generalized): Secondary | ICD-10-CM | POA: Diagnosis not present

## 2017-12-12 NOTE — Therapy (Signed)
[place] [address] [place], [place], [address] Phone: [phone]   Fax:  [phone]  Physical Therapy Treatment  Patient Details  Name: Adrian Schneider MRN: [id number] Date of Birth: August 11, 1966 Referring Provider: Dr. Letta Pate   Encounter Date: 12/12/2017  PT End of Session - 12/12/17 1239    Visit Number  7    Number of Visits  17    Date for PT Re-Evaluation  01/05/18 [employer]    Authorization Type  BCBS    PT Start Time  5462    PT Stop Time  1101    PT Time Calculation (min)  46 min    Activity Tolerance  Patient tolerated treatment well    Behavior During Therapy  [facility] for tasks assessed/performed       Past Medical History:  Diagnosis Date  . Cerebral palsy ([place])    dx at birth, has poor balance  . Hemorrhoids   . Hyperlipidemia 6/11   started meds  . Hypertension 12/2009  . Left ankle pain    MRI in 06/2015 did not show specific internal derangement, had mild degenerative chondral thinning in the tibiotalar joint w/o a focal lesion  . MVP (mitral valve prolapse)    dx years ago  . Wart    L foot, non- healing lesion, f/u by derm    Past Surgical History:  Procedure Laterality Date  . LEG SURGERY Bilateral    release muscles d/t "cerebral palsy"    There were no vitals filed for this visit.  Subjective Assessment - 12/12/17 1222    Subjective  Reports working out this morning to "get loose" before this session.     Pertinent History  HTN, hx of CP, HLD, tendonitis in RLE (hip), hx of L lower leg nerve pain, recent botox in BLEs (10/31/17).    Patient Stated Goals  Be able to catch himself, walk without AD    Currently in Pain?  No/denies                      [facility] Adult PT Treatment/Exercise - 12/12/17 0001      Ambulation/Gait   Ambulation/Gait  Yes    Ambulation/Gait Assistance  5: Supervision;4: Min guard    Ambulation/Gait Assistance Details  With use of lower leg  Bioness system for L ankle DF, had pt ambulate several laps.  Initially note increased eversion, therefore adjusted cuff medially which made marked improvement in pure DF.  He has marked improvement in DF activation, however note decreased L PF during push off phase of gait.  Had pt work on this seperately during [place] exercises and then ambulate again.  Note improvement in this and also addressing L (and R) foot slap for controlled DF into midstance.      Ambulation Distance (Feet)  500 Feet    Assistive device  None    Gait Pattern  Step-through pattern;Decreased stride length;Decreased dorsiflexion - right;Decreased dorsiflexion - left;Right hip hike;Poor foot clearance - right;Decreased arm swing - right;Decreased hip/knee flexion - left;Ataxic;Poor foot clearance - left    Ambulation Surface  Level;Indoor      Neuro Re-ed    Neuro Re-ed Details   Had pt stand at support surface with feet staggered working on push off with back leg (both R and L) for PF strength.  Also had pt place L foot on half foam roller and work on moving from heel to toe (pushing into PF)  also with emphasis on improved hip and knee extension during stance phase of gait.        Modalities   Modalities  Teacher, English as a foreign language Location  L lower leg over tibial n    Electrical Stimulation Action  Improved L ankle DF    Electrical Stimulation Parameters  See parameters in tablet 2    Electrical Stimulation Goals  Neuromuscular facilitation             PT Education - 12/12/17 1239    Education provided  Yes    Education Details  adding staggered stance PF exercise to HEP    Person(s) Educated  Patient    Methods  Explanation;Demonstration    Comprehension  Verbalized understanding;Returned demonstration       PT Short Term Goals - 12/05/17 1025      PT SHORT TERM GOAL #1   Title  Pt will be IND be HEP to improve strength, flexibility, balance, and endurance.  TARGET DATE FOR ALL STGS: 12/04/17    Status  On-going      PT SHORT TERM GOAL #2   Title  Pt will improve FGA score to >/=22/30 to decr. falls risk.     Baseline  21/30    Status  Partially Met      PT SHORT TERM GOAL #3   Title  Perform 6MWT and write goals as indicated.     Baseline  met    Status  Achieved      PT SHORT TERM GOAL #4   Title  Pt will amb. 500' over even terrain, while performing head turns, in order to improve safety during functional mobility.     Baseline  supervision due to veering, pt with significant SOB    Status  Partially Met        PT Long Term Goals - 12/05/17 1421      PT LONG TERM GOAL #1   Title  Pt will verbalize understanding of fall prevention strategies to decr. falls risk. TARGET DATE FOR ALL LTGS: 01/01/18    Status  On-going      PT LONG TERM GOAL #2   Title  Pt will improve FGA score to >/=24/30 to decr. falls risk.     Baseline  21/30 at 4 weeks    Status  Revised      PT LONG TERM GOAL #3   Title  Pt will amb. 1000' over uneven terrain outdoors, at MOD I level, in order to improve safety during functional mobility.     Status  On-going      PT LONG TERM GOAL #4   Title  Pt will traverse 12 steps without handrail with S to safely negotiate bleacher steps at events.    Status  On-going      PT LONG TERM GOAL #5   Title  Trial AD and recommend appropriate device, as tolerated by pt.     Status  On-going      Additional Long Term Goals   Additional Long Term Goals  Yes      PT LONG TERM GOAL #6   Title  Pt will improve ambulation endurance and distance on 6 minute walk test by 150 feet rating 12-13 on BORG RPE    Baseline  1383'; 13 on RPE    Status  New            Plan - 12/12/17 1243  Clinical Impression Statement  Skilled session utilized Bioness on L lower leg for improved L ankle DF for L clearance during gait.  Also addressed improving L (and R) PF strength for improved push off during gait as well as improved  hip and knee extension during stance.  Pt with good tolerance to Bioness and good DF activation.      Rehab Potential  Good    Clinical Impairments Affecting Rehab Potential  see above.     PT Frequency  2x / week    PT Duration  8 weeks    PT Treatment/Interventions  ADLs/Self Care Home Management;Biofeedback;Therapeutic exercise;Therapeutic activities;Manual techniques;Electrical Stimulation;Functional mobility training;Stair training;Gait training;Orthotic Fit/Training;Patient/family education;Neuromuscular re-education;Balance training;Vestibular    PT Next Visit Plan  Bioness LLE lower cuff; endurance on elliptical, Provide fall prevention handout. Continue stretching, balance, and strengthening HEP.  Perform external pertubations to improve reaction time during LOB.     Consulted and Agree with Plan of Care  Patient       Patient will benefit from skilled therapeutic intervention in order to improve the following deficits and impairments:  Abnormal gait, Decreased endurance, Decreased coordination, Impaired flexibility, Postural dysfunction, Decreased range of motion, Decreased balance, Decreased mobility, Impaired tone, Decreased knowledge of use of DME, Decreased strength  Visit Diagnosis: Other lack of coordination  Other abnormalities of gait and mobility  Muscle weakness (generalized)     Problem List Patient Active Problem List   Diagnosis Date Noted  . Cerebral palsy ([place]) 09/30/2017  . PCP NOTES>>>>>>>>>>>>>>>>>>>> 05/16/2016  . Skin lesion 12/23/2015  . Hyperglycemia 01/27/2014  . Hemorrhoids   . Groin pain 02/08/2012  . Annual physical exam 05/21/2011  . Hyperlipidemia 11/03/2010  . BACK PAIN 02/09/2010  . Essential hypertension 12/21/2009    Cameron Sprang, PT, MPT [facility] [address] [place], [place], [address] Phone: [phone]   Fax:  [phone] 12/12/17, 12:50 PM  Name: Adrian Schneider MRN:  [id number] Date of Birth: August 08, 1966

## 2017-12-12 NOTE — Patient Instructions (Addendum)
Piriformis Stretch, Standing: Yoga Crossed Leg Chair    Do this lying down!! Use strap if needed to place around your leg and cross leg over opposite leg and hold.  Hold for 2-3 mins at least and do both sides 2-3 mins.   Copyright  VHI. All rights reserved.   Gastroc / Heel Cord Stretch - On Step    Stand with heels over edge of stair. Holding rail, lower heels until stretch is felt in calf of legs.  Hold for 1-2 mins.  Do 2-3 times, 2x per day.    Copyright  VHI. All rights reserved.   Remember sitting at your desk.  Have feet flat on the floor or propped on block, knees and hips close to 90 degrees.  Sit towards edge of chair with more weight on legs.  Can add pillow or towel roll to low back in chair to help posture.     HIP: Abduction / External Rotation - Side-Lying    Lie on side with hip and knees bent. Raise top knee up, squeezing glutes. Keep ankles together. __10_ reps per set, _2__ sets per day, _5-7__ days per week   Copyright  VHI. All rights reserved.   Straight Leg Raise    Tighten stomach and slowly raise locked right leg __5-6__ inches from floor.  Make sure you lower slowly. Repeat _10___ times per set. Do __1__ sets per session. Do __2__ sessions per day.  http://orth.exer.us/1102   Copyright  VHI. All rights reserved.       Joaquin Bend with Hamstring Curl  Lie down on your back with your feet on the ball and your knees straight. Lift your hips off the table and curl the ball towards your body. Straighten your legs back out and lower your body to the table.  Do 8-10 times, try to do 2x per day.      Core hooklying marching opposite leg  Don's worry about holding a ball, lie on your back with knees bent.  Slowly lift opposite foot 6 inches off the surface, keeping knee bent. Slowly return foot to surface.  Let heel touch only briefly before raising opposite leg.  Progress by having no touch of heel.  Do 10 times, 2 times per day  if possible.

## 2017-12-13 ENCOUNTER — Ambulatory Visit (HOSPITAL_BASED_OUTPATIENT_CLINIC_OR_DEPARTMENT_OTHER): Payer: BLUE CROSS/BLUE SHIELD | Admitting: Physical Medicine & Rehabilitation

## 2017-12-13 ENCOUNTER — Encounter: Payer: Self-pay | Admitting: Physical Medicine & Rehabilitation

## 2017-12-13 ENCOUNTER — Encounter: Payer: BLUE CROSS/BLUE SHIELD | Attending: Physical Medicine & Rehabilitation

## 2017-12-13 VITALS — BP 124/82 | HR 80 | Resp 14

## 2017-12-13 DIAGNOSIS — G801 Spastic diplegic cerebral palsy: Secondary | ICD-10-CM | POA: Insufficient documentation

## 2017-12-13 NOTE — Progress Notes (Signed)
Subjective:    Patient ID: Adrian Schneider, male    DOB: 12-17-1965, 52 y.o.   MRN: [id number]  HPI Couple falls, once at work but no injury. 12/14 GastrosoleusRight 100, Left 100 Right Hamstrings100 has started physical therapy and has been working hard to increase ankle knee and hip range of motion bilaterally. Has had one episode of shooting pain in the left foot. Pain Inventory Average Pain 3 Pain Right Now 2 My pain is dull  In the last 24 hours, has pain interfered with the following? General activity 1 Relation with others 0 Enjoyment of life 1 What TIME of day is your pain at its worst? 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  no  Physicians involved in your care Any changes since last visit?  no   Family History  Problem Relation Age of Onset  . Coronary artery disease Mother   . Hypertension Mother   . Coronary artery disease Maternal Grandmother   . Hypertension Father   . Diabetes Father   . Stroke Neg Hx   . Colon cancer Neg Hx   . Prostate cancer Neg Hx   . Esophageal cancer Neg Hx   . Stomach cancer Neg Hx   . Rectal cancer Neg Hx    Social History   Socioeconomic History  . Marital status: Single    Spouse name: None  . Number of children: 0  . Years of education: None  . Highest education level: None  Social Needs  . Financial resource strain: None  . Food insecurity - worry: None  . Food insecurity - inability: None  . Transportation needs - medical: None  . Transportation needs - non-medical: None  Occupational History  . 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 Smoking status: Never Smoker  . Smokeless tobacco: Never Used  Substance  and Sexual Activity  . Alcohol use: Yes    Alcohol/week: 0.0 oz    Comment: socially; weekly 1-2 beers weekly  . Drug use: No  . Sexual activity: None  Other Topics Concern  . None  Social History Narrative   Single, no children, lives by himself   Past Surgical History:  Procedure Laterality Date  . LEG SURGERY Bilateral    release muscles d/t "cerebral palsy"   Past Medical History:  Diagnosis Date  . Cerebral palsy ([place])    dx at birth, has poor balance  . Hemorrhoids   . Hyperlipidemia 6/11   started meds  . Hypertension 12/2009  . Left ankle pain    MRI in 06/2015 did not show specific internal derangement, had mild degenerative chondral thinning in the tibiotalar joint w/o a focal lesion  . MVP (mitral valve prolapse)    dx years ago  . Wart    L foot, non- healing lesion, f/u by derm   BP 124/82 (BP Location: Left Arm, Patient Position: Sitting, Cuff Size: Normal)   Pulse 80   Resp 14   SpO2 98%   Opioid Risk Score:   Fall Risk Score:  `1  Depression screen PHQ 2/9  Depression screen [facility] 2/9 10/18/2017 10/18/2017 09/30/2017 05/16/2016 08/24/2013  Decreased Interest 0  0 0 0 0  Down, Depressed, Hopeless 0 0 0 0 0  PHQ - 2 Score 0 0 0 0 0  Altered sleeping 0 - - - -  Tired, decreased energy 0 - - - -  Change in appetite 0 - - - -  Feeling bad or failure about yourself  0 - - - -  Trouble concentrating 0 - - - -  Moving slowly or fidgety/restless 0 - - - -  Suicidal thoughts 0 - - - -  PHQ-9 Score 0 - - - -  Difficult doing work/chores Not difficult at all - - - -    Review of Systems  Constitutional: Negative.   HENT: Negative.   Eyes: Negative.   Respiratory: Negative.   Cardiovascular: Negative.   Gastrointestinal: Negative.   Endocrine: Negative.   Genitourinary: Negative.   Musculoskeletal: Positive for gait problem.  Skin: Negative.   Allergic/Immunologic: Negative.   Hematological: Negative.   Psychiatric/Behavioral: Negative.          Objective:   Physical Exam  Constitutional: He is oriented to person, place, and time. He appears well-developed and well-nourished.  HENT:  Head: Normocephalic and atraumatic.  Eyes: Conjunctivae and EOM are normal. Pupils are equal, round, and reactive to light.  Neurological: He is alert and oriented to person, place, and time.  Psychiatric: He has a normal mood and affect. His behavior is normal. Judgment and thought content normal.  Nursing note and vitals reviewed.  5/5 bilateral hip flexor knee extensor 3- bilateral ankle dorsiflexors -10 from neutral ankle PF Quads to 90degrees flex bilateral Lacks 10 degrees from full extension at the knees bilaterally Ambulates with a crouched gait pattern poor heel strike stays up on the balls of his feet.No evidence of knee instability.    Assessment & Plan:   1.  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  He has had good relief with Botox injection into bilateral gastrocs as well as right hamstring.  Still working on ankle range of motion, making some progress but this is slow.  We discussed his dosing and I would recommend increasing the dose of Botox by 50 units to each gastroc. Patient is in agreement with this. Patient has some tightness also at the right hip abductors may consider obturator nerve block in the future. If extra Botox fails to relieve spasticity adequately in the gastroc area may consider bilateral tibial nerve block with phenol and then increasing doses of Botox to the hamstrings. Botox  150U each gastroc 100U R hamstring Continue outpatient physical therapy, would avoid oral anti-spasticity medication secondary to risk of sedation Return to clinic in approximately 6 or 7 weeks for repeat Botox injections

## 2017-12-16 ENCOUNTER — Ambulatory Visit: Payer: BLUE CROSS/BLUE SHIELD | Admitting: Rehabilitation

## 2017-12-16 ENCOUNTER — Encounter: Payer: Self-pay | Admitting: Rehabilitation

## 2017-12-16 DIAGNOSIS — R2689 Other abnormalities of gait and mobility: Secondary | ICD-10-CM | POA: Diagnosis not present

## 2017-12-16 DIAGNOSIS — R278 Other lack of coordination: Secondary | ICD-10-CM

## 2017-12-16 DIAGNOSIS — M6281 Muscle weakness (generalized): Secondary | ICD-10-CM | POA: Diagnosis not present

## 2017-12-16 NOTE — Therapy (Signed)
[facility] [address] [place], [place], [address] Phone: [phone]   Fax:  [phone]  Physical Therapy Treatment  Patient Details  Name: Adrian Schneider MRN: [id number] Date of Birth: [birth year] Referring Provider: Dr. Letta Pate   Encounter Date: 12/16/2017  PT End of Session - 12/16/17 1312    Visit Number  8    Number of Visits  17    Date for PT Re-Evaluation  01/05/18 [employer]    Authorization Type  BCBS    PT Start Time  1016    PT Stop Time  1102    PT Time Calculation (min)  46 min    Activity Tolerance  Patient tolerated treatment well    Behavior During Therapy  [facility] for tasks assessed/performed       Past Medical History:  Diagnosis Date  . Cerebral palsy ([place])    dx at birth, has poor balance  . Hemorrhoids   . Hyperlipidemia 6/11   started meds  . Hypertension 12/2009  . Left ankle pain    MRI in 06/2015 did not show specific internal derangement, had mild degenerative chondral thinning in the tibiotalar joint w/o a focal lesion  . MVP (mitral valve prolapse)    dx years ago  . Wart    L foot, non- healing lesion, f/u by derm    Past Surgical History:  Procedure Laterality Date  . LEG SURGERY Bilateral    release muscles d/t "cerebral palsy"    There were no vitals filed for this visit.  Subjective Assessment - 12/16/17 1018    Subjective  Pt reports doing well, slight "strain" this morning when walking from car.     Pertinent History  HTN, hx of CP, HLD, tendonitis in RLE (hip), hx of L lower leg nerve pain, recent botox in BLEs (10/31/17).    Patient Stated Goals  Be able to catch himself, walk without AD    Currently in Pain?  No/denies                      [facility] Adult PT Treatment/Exercise - 12/16/17 0001      Ambulation/Gait   Ambulation/Gait  Yes    Ambulation/Gait Assistance  5: Supervision    Ambulation/Gait Assistance Details  Continued gait training for  improved heel to toe contact.  Note marked improvement in L heel strike initially, however pt does fatigue quickly and actually note that R demos more deficit during session.  Discussed trying to use Bioness on RLE at next visit.     Ambulation Distance (Feet)  400 Feet    Assistive device  None    Gait Pattern  Step-through pattern;Decreased stride length;Decreased dorsiflexion - right;Decreased dorsiflexion - left;Right hip hike;Poor foot clearance - right;Decreased arm swing - right;Decreased hip/knee flexion - left;Ataxic;Poor foot clearance - left    Ambulation Surface  Level;Indoor      Self-Care   Self-Care  Other Self-Care Comments    Other Self-Care Comments   Pt continues to state that his hip "seizes" when he sits or stands from church pew.  PT unable to re-create this during session.  Feel that it is likley tone/tightness in LEs.        Exercises   Exercises  Other Exercises    Other Exercises   B Calf stretch and heel raise on stairs x 2 sets of 1 min, 10 heel raises.  In // bars, with both feet on half  foam roll weight shifting forwards/backwards with emphasis on forward plantar flexion to carry over to gait.  Progressed to small half foam roll with LLE on roll advancing RLE forward/backwards to encourage heel to toe roll through stance phase of gait.  Pt did very well therefore progressed to gait, see gait details above.   Worked on BLE strength, core strength and flexibility with supine BLE bridging with LEs on physioball x 10 reps progressing to bridging with hamstring curls (cues for slower controlled movement, even if that meant he didn't get hips as high) x 10 reps, plank on elbows with 10 sec holds x 5 reps, B hip flex stretch (runner's stretch) x 2 mins on each side.                 PT Short Term Goals - 12/05/17 1025      PT SHORT TERM GOAL #1   Title  Pt will be IND be HEP to improve strength, flexibility, balance, and endurance. TARGET DATE FOR ALL STGS: 12/04/17     Status  On-going      PT SHORT TERM GOAL #2   Title  Pt will improve FGA score to >/=22/30 to decr. falls risk.     Baseline  21/30    Status  Partially Met      PT SHORT TERM GOAL #3   Title  Perform 6MWT and write goals as indicated.     Baseline  met    Status  Achieved      PT SHORT TERM GOAL #4   Title  Pt will amb. 500' over even terrain, while performing head turns, in order to improve safety during functional mobility.     Baseline  supervision due to veering, pt with significant SOB    Status  Partially Met        PT Long Term Goals - 12/05/17 1421      PT LONG TERM GOAL #1   Title  Pt will verbalize understanding of fall prevention strategies to decr. falls risk. TARGET DATE FOR ALL LTGS: 01/01/18    Status  On-going      PT LONG TERM GOAL #2   Title  Pt will improve FGA score to >/=24/30 to decr. falls risk.     Baseline  21/30 at 4 weeks    Status  Revised      PT LONG TERM GOAL #3   Title  Pt will amb. 1000' over uneven terrain outdoors, at MOD I level, in order to improve safety during functional mobility.     Status  On-going      PT LONG TERM GOAL #4   Title  Pt will traverse 12 steps without handrail with S to safely negotiate bleacher steps at events.    Status  On-going      PT LONG TERM GOAL #5   Title  Trial AD and recommend appropriate device, as tolerated by pt.     Status  On-going      Additional Long Term Goals   Additional Long Term Goals  Yes      PT LONG TERM GOAL #6   Title  Pt will improve ambulation endurance and distance on 6 minute walk test by 150 feet rating 12-13 on BORG RPE    Baseline  1383'; 13 on RPE    Status  New            Plan - 12/16/17 1312    Clinical Impression Statement  Skilled session continues to focus on improved gait mechanics with heel to toe contact during stance phase of gait.  Also continue to address BLE strength, flexibility and core strength.  Pt making steady progress towards goals.     Rehab  Potential  Good    Clinical Impairments Affecting Rehab Potential  see above.     PT Frequency  2x / week    PT Duration  8 weeks    PT Treatment/Interventions  ADLs/Self Care Home Management;Biofeedback;Therapeutic exercise;Therapeutic activities;Manual techniques;Electrical Stimulation;Functional mobility training;Stair training;Gait training;Orthotic Fit/Training;Patient/family education;Neuromuscular re-education;Balance training;Vestibular    PT Next Visit Plan  Bioness LLE lower cuff (try R at next session); endurance on elliptical, Provide fall prevention handout. Continue stretching, balance, and strengthening HEP.  Perform external pertubations to improve reaction time during LOB.     Consulted and Agree with Plan of Care  Patient       Patient will benefit from skilled therapeutic intervention in order to improve the following deficits and impairments:  Abnormal gait, Decreased endurance, Decreased coordination, Impaired flexibility, Postural dysfunction, Decreased range of motion, Decreased balance, Decreased mobility, Impaired tone, Decreased knowledge of use of DME, Decreased strength  Visit Diagnosis: Other lack of coordination  Other abnormalities of gait and mobility  Muscle weakness (generalized)     Problem List Patient Active Problem List   Diagnosis Date Noted  . Cerebral palsy ([place]) 09/30/2017  . PCP NOTES>>>>>>>>>>>>>>>>>>>> 05/16/2016  . Skin lesion 12/23/2015  . Hyperglycemia 01/27/2014  . Hemorrhoids   . Groin pain 02/08/2012  . Annual physical exam 05/21/2011  . Hyperlipidemia 11/03/2010  . BACK PAIN 02/09/2010  . Essential hypertension 12/21/2009   Cameron Sprang, PT, MPT [facility] [address] [place], [place], [address] Phone: [phone]   Fax:  [phone] 12/16/17, 1:15 PM  Name: Adrian Schneider MRN: [id number] Date of Birth: December 08, 1965

## 2017-12-19 ENCOUNTER — Ambulatory Visit: Payer: BLUE CROSS/BLUE SHIELD | Admitting: Rehabilitation

## 2017-12-19 ENCOUNTER — Encounter: Payer: Self-pay | Admitting: Rehabilitation

## 2017-12-19 DIAGNOSIS — R2689 Other abnormalities of gait and mobility: Secondary | ICD-10-CM | POA: Diagnosis not present

## 2017-12-19 DIAGNOSIS — M6281 Muscle weakness (generalized): Secondary | ICD-10-CM

## 2017-12-19 DIAGNOSIS — R278 Other lack of coordination: Secondary | ICD-10-CM

## 2017-12-19 NOTE — Therapy (Signed)
[facility] [address] [place], [place], [address] Phone: [phone]   Fax:  [phone]  Physical Therapy Treatment  Patient Details  Name: Adrian Schneider MRN: [id number] Date of Birth: Sep 07, 1966 Referring Provider: Dr. Letta Pate   Encounter Date: 12/19/2017  Adrian Schneider End of Session - 12/19/17 1222    Visit Number  9    Number of Visits  17    Date for Adrian Schneider Re-Evaluation  01/05/18 [employer]    Authorization Type  BCBS    Adrian Schneider Start Time  1102    Adrian Schneider Stop Time  1147    Adrian Schneider Time Calculation (min)  45 min    Activity Tolerance  Patient tolerated treatment well    Behavior During Therapy  [facility] for tasks assessed/performed       Past Medical History:  Diagnosis Date  . Cerebral palsy ([place])    dx at birth, has poor balance  . Hemorrhoids   . Hyperlipidemia 6/11   started meds  . Hypertension 12/2009  . Left ankle pain    MRI in 06/2015 did not show specific internal derangement, had mild degenerative chondral thinning in the tibiotalar joint w/o a focal lesion  . MVP (mitral valve prolapse)    dx years ago  . Wart    L foot, non- healing lesion, f/u by derm    Past Surgical History:  Procedure Laterality Date  . LEG SURGERY Bilateral    release muscles d/t "cerebral palsy"    There were no vitals filed for this visit.  Subjective Assessment - 12/19/17 1119    Subjective  Reports no strains, did have one during session however, no LOB.     Pertinent History  HTN, hx of CP, HLD, tendonitis in RLE (hip), hx of L lower leg nerve pain, recent botox in BLEs (10/31/17).    Patient Stated Goals  Be able to catch himself, walk without AD    Currently in Pain?  Yes    Pain Score  3     Pain Location  Leg    Pain Orientation  Right    Pain Descriptors / Indicators  Dull    Pain Type  Acute pain    Pain Onset  In the past 7 days    Pain Frequency  Intermittent    Aggravating Factors   unsure    Pain Relieving Factors  heat                       [facility] Adult Adrian Schneider Treatment/Exercise - 12/19/17 1214      Ambulation/Gait   Ambulation/Gait  Yes    Ambulation/Gait Assistance  5: Supervision    Ambulation/Gait Assistance Details  While using gait training mode with Bioness on R lower leg for DF assist, had Adrian Schneider ambulate several laps (with rest breaks in between to avoid fatigue).  Provided cues for improving L heel strike at initial contact.  Note marked improvement overall.  R LE had increased difficulty landing on heel, however did note improved control with DF into foot flat and demo'd less foot slap.  Feel that his RLE tightness is preventing full heel contact during gait.      Ambulation Distance (Feet)  500 Feet    Assistive device  None    Gait Pattern  Step-through pattern;Decreased stride length;Decreased dorsiflexion - right;Decreased dorsiflexion - left;Right hip hike;Poor foot clearance - right;Decreased arm swing - right;Decreased hip/knee flexion - left;Ataxic;Poor foot clearance -  left    Ambulation Surface  Level;Indoor    Gait Comments  Made several adjustments to Bioness leg unit in order to attempt to get less eversion.  Maintained with the quick pad and did not go to steering due to time constraint.        Self-Care   Self-Care  Other Self-Care Comments    Other Self-Care Comments   Briefly went over exercise on half foam roll from last session to work on heel strike and push off in stance phase of gait as Adrian Schneider states he would like to purchase small half foam roll to work at home.  Adrian Schneider demo'd and Adrian Schneider verbalized understanding.       Exercises   Exercises  Other Exercises    Other Exercises   Performed RLE IT band stretch with use of strap, recommend he do this at home.  Adrian Schneider also asking about acheiving what massage therapist did to IT band.  Demo'd to Adrian Schneider how to use foam roller on IT band to perform release.  Adrian Schneider able to return demo.        Modalities   Modalities  IT sales professional Location  R Lower leg over Tibial N.     Electrical Stimulation Action  Improved R ankle DF    Electrical Stimulation Parameters  See parameters in tablet 2    Electrical Stimulation Goals  Neuromuscular facilitation        Manual therapy:  Performed soft tissue mobilization/trigger point release to R lateral hamstring and R piriformis.  Educated Adrian Schneider on how to perform with use of tennis ball on firm surface at home.  Adrian Schneider verbalized understanding.      Adrian Schneider Education - 12/19/17 1222    Education provided  Yes    Education Details  see self care    Person(s) Educated  Patient    Methods  Explanation    Comprehension  Verbalized understanding       Adrian Schneider Short Term Goals - 12/05/17 1025      Adrian Schneider SHORT TERM GOAL #1   Title  Adrian Schneider will be IND be HEP to improve strength, flexibility, balance, and endurance. TARGET DATE FOR ALL STGS: 12/04/17    Status  On-going      Adrian Schneider SHORT TERM GOAL #2   Title  Adrian Schneider will improve FGA score to >/=22/30 to decr. falls risk.     Baseline  21/30    Status  Partially Met      Adrian Schneider SHORT TERM GOAL #3   Title  Perform 6MWT and write goals as indicated.     Baseline  met    Status  Achieved      Adrian Schneider SHORT TERM GOAL #4   Title  Adrian Schneider will amb. 500' over even terrain, while performing head turns, in order to improve safety during functional mobility.     Baseline  supervision due to veering, Adrian Schneider with significant SOB    Status  Partially Met        Adrian Schneider Long Term Goals - 12/05/17 1421      Adrian Schneider LONG TERM GOAL #1   Title  Adrian Schneider will verbalize understanding of fall prevention strategies to decr. falls risk. TARGET DATE FOR ALL LTGS: 01/01/18    Status  On-going      Adrian Schneider LONG TERM GOAL #2   Title  Adrian Schneider will improve FGA score to >/=24/30 to decr. falls risk.  Baseline  21/30 at 4 weeks    Status  Revised      Adrian Schneider LONG TERM GOAL #3   Title  Adrian Schneider will amb. 1000' over uneven terrain outdoors, at MOD I level, in order to improve  safety during functional mobility.     Status  On-going      Adrian Schneider LONG TERM GOAL #4   Title  Adrian Schneider will traverse 12 steps without handrail with S to safely negotiate bleacher steps at events.    Status  On-going      Adrian Schneider LONG TERM GOAL #5   Title  Trial AD and recommend appropriate device, as tolerated by Adrian Schneider.     Status  On-going      Additional Long Term Goals   Additional Long Term Goals  Yes      Adrian Schneider LONG TERM GOAL #6   Title  Adrian Schneider will improve ambulation endurance and distance on 6 minute walk test by 150 feet rating 12-13 on BORG RPE    Baseline  1383'; 13 on RPE    Status  New            Plan - 12/19/17 1223    Clinical Impression Statement  Session focused on NMR with use of Bioness on RLE for improved L ankle DF during gait.  Did not notice as much DF on RLE as with LLE, however do note improved control and less foot slap at initial contact.  Do note continued carryover from LLE with improved L heel strike.  Adrian Schneider did have slight "strain" in L ankle during gait, however did not have any LOB.      Rehab Potential  Good    Clinical Impairments Affecting Rehab Potential  see above.     Adrian Schneider Frequency  2x / week    Adrian Schneider Duration  8 weeks    Adrian Schneider Treatment/Interventions  ADLs/Self Care Home Management;Biofeedback;Therapeutic exercise;Therapeutic activities;Manual techniques;Electrical Stimulation;Functional mobility training;Stair training;Gait training;Orthotic Fit/Training;Patient/family education;Neuromuscular re-education;Balance training;Vestibular    Adrian Schneider Next Visit Plan  Bioness LLE lower cuff (try R at next session); endurance on elliptical, Provide fall prevention handout. Continue stretching, balance, and strengthening HEP.  Perform external pertubations to improve reaction time during LOB.     Consulted and Agree with Plan of Care  Patient       Patient will benefit from skilled therapeutic intervention in order to improve the following deficits and impairments:  Abnormal gait,  Decreased endurance, Decreased coordination, Impaired flexibility, Postural dysfunction, Decreased range of motion, Decreased balance, Decreased mobility, Impaired tone, Decreased knowledge of use of DME, Decreased strength  Visit Diagnosis: Other lack of coordination  Other abnormalities of gait and mobility  Muscle weakness (generalized)     Problem List Patient Active Problem List   Diagnosis Date Noted  . Cerebral palsy ([place]) 09/30/2017  . PCP NOTES>>>>>>>>>>>>>>>>>>>> 05/16/2016  . Skin lesion 12/23/2015  . Hyperglycemia 01/27/2014  . Hemorrhoids   . Groin pain 02/08/2012  . Annual physical exam 05/21/2011  . Hyperlipidemia 11/03/2010  . BACK PAIN 02/09/2010  . Essential hypertension 12/21/2009    Adrian Schneider, Adrian Schneider, Adrian Schneider [facility] [address] [place], [place], [address] Phone: [phone]   Fax:  [phone] 12/19/17, 12:27 PM  Name: SEDALE JENIFER Schneider MRN: [id number] Date of Birth: [birth year]

## 2017-12-23 ENCOUNTER — Encounter: Payer: Self-pay | Admitting: Rehabilitation

## 2017-12-23 ENCOUNTER — Ambulatory Visit: Payer: BLUE CROSS/BLUE SHIELD | Attending: Physical Medicine & Rehabilitation | Admitting: Rehabilitation

## 2017-12-23 DIAGNOSIS — R278 Other lack of coordination: Secondary | ICD-10-CM | POA: Diagnosis not present

## 2017-12-23 DIAGNOSIS — R2689 Other abnormalities of gait and mobility: Secondary | ICD-10-CM | POA: Diagnosis not present

## 2017-12-23 DIAGNOSIS — M6281 Muscle weakness (generalized): Secondary | ICD-10-CM | POA: Diagnosis not present

## 2017-12-23 NOTE — Therapy (Signed)
[facility] [address] [place], [place], [address] Phone: [phone]   Fax:  [phone]  Physical Therapy Treatment  Patient Details  Name: Adrian Schneider MRN: [id number] Date of Birth: 02/11/1966 Referring Provider: Dr. Kirsteins   Encounter Date: 12/23/2017  PT End of Session - 12/23/17 1307    Visit Number  10    Number of Visits  17    Date for PT Re-Evaluation  01/05/18 [employer]    Authorization Type  BCBS    PT Start Time  1018    PT Stop Time  1100    PT Time Calculation (min)  42 min    Activity Tolerance  Patient tolerated treatment well    Behavior During Therapy  WFL for tasks assessed/performed       Past Medical History:  Diagnosis Date  . 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, has poor balance  . Hemorrhoids   . Hyperlipidemia 6/11   started meds  . Hypertension 12/2009  . Left ankle pain    MRI in 06/2015 did not show specific internal derangement, had mild degenerative chondral thinning in the tibiotalar joint w/o a focal lesion  . MVP (mitral valve prolapse)    dx years ago  . Wart    L foot, non- healing lesion, f/u by derm    Past Surgical History:  Procedure Laterality Date  . LEG SURGERY Bilateral    release muscles d/t "cerebral palsy"    There were no vitals filed for this visit.  Subjective Assessment - 12/23/17 1020    Subjective  Pt reports no strains over the weekend.     Pertinent History  HTN, hx of CP, HLD, tendonitis in RLE (hip), hx of L lower leg nerve pain, recent botox in BLEs (10/31/17).    Patient Stated Goals  Be able to catch himself, walk without AD    Currently in Pain?  Yes    Pain Score  2     Pain Location  Hip    Pain Orientation  Right    Pain Descriptors / Indicators  Sore    Pain Type  Acute pain    Pain Onset  In the past 7 days    Pain Frequency  Intermittent    Aggravating Factors   unsure    Pain Relieving Factors  heat                        [facility] Adult PT Treatment/Exercise - 12/23/17 0001      Ambulation/Gait   Ambulation/Gait  Yes    Ambulation/Gait Assistance  5: Supervision    Ambulation/Gait Assistance Details  Continue to provide cues for slower gait speed to place more emphasis on heel to toe contact during stance phase of gait.  note marked improvement when he slows gait speed slightly.  Educated that he will likely have to do this to focus more on control at ankles and to prevent falls/strains.  Pt verbalized understanding.     Ambulation Distance (Feet)  300 Feet    Assistive device  None    Gait Pattern  Step-through pattern;Decreased stride length;Decreased dorsiflexion - right;Decreased dorsiflexion - left;Right hip hike;Poor foot clearance - right;Decreased arm swing - right;Decreased hip/knee flexion - left;Ataxic;Poor foot clearance - left    Ambulation Surface  Level;Indoor      Self-Care   Self-Care  Other Self-Care Comments    Other Self-Care Comments     continue to educate on benefits of toe cap to avoid toe catches and falls.        Exercises   Exercises  Other Exercises    Other Exercises   R IT band stretch x 45 secs, supine and seated piriformis stretch x 1 min each.   Warm up on elliptical x 2 mins, 2 mins increased pace, 2 mins cool down.   In // bars standing on BOSU (black top up) moving ball in clockwise and counter clockwise for ankle strengthening.  Note overt hip movement due to ankle weakness and tightness, therefore transitioned to moving forward/backwards with marked improvement.  Sitting on EOM with R LE on step and LLE back into plantar flexion moving into standing to work on improved plantar flexion strength.  Step downs for improved quad strength along with gastroc stretch x 5 reps on each side however this caused increased knee pain, therefore D/C'd exercise.                 PT Short Term Goals - 12/05/17 1025      PT SHORT TERM GOAL #1   Title  Pt  will be IND be HEP to improve strength, flexibility, balance, and endurance. TARGET DATE FOR ALL STGS: 12/04/17    Status  On-going      PT SHORT TERM GOAL #2   Title  Pt will improve FGA score to >/=22/30 to decr. falls risk.     Baseline  21/30    Status  Partially Met      PT SHORT TERM GOAL #3   Title  Perform 6MWT and write goals as indicated.     Baseline  met    Status  Achieved      PT SHORT TERM GOAL #4   Title  Pt will amb. 500' over even terrain, while performing head turns, in order to improve safety during functional mobility.     Baseline  supervision due to veering, pt with significant SOB    Status  Partially Met        PT Long Term Goals - 12/05/17 1421      PT LONG TERM GOAL #1   Title  Pt will verbalize understanding of fall prevention strategies to decr. falls risk. TARGET DATE FOR ALL LTGS: 01/01/18    Status  On-going      PT LONG TERM GOAL #2   Title  Pt will improve FGA score to >/=24/30 to decr. falls risk.     Baseline  21/30 at 4 weeks    Status  Revised      PT LONG TERM GOAL #3   Title  Pt will amb. 1000' over uneven terrain outdoors, at MOD I level, in order to improve safety during functional mobility.     Status  On-going      PT LONG TERM GOAL #4   Title  Pt will traverse 12 steps without handrail with S to safely negotiate bleacher steps at events.    Status  On-going      PT LONG TERM GOAL #5   Title  Trial AD and recommend appropriate device, as tolerated by pt.     Status  On-going      Additional Long Term Goals   Additional Long Term Goals  Yes      PT LONG TERM GOAL #6   Title  Pt will improve ambulation endurance and distance on 6 minute walk test by 150 feet rating 12-13 on BORG   RPE    Baseline  1383'; 13 on RPE    Status  New            Plan - 12/23/17 1308    Clinical Impression Statement  Skilled session continues to focus on BLE flexibility and strength, esp at ankles.  Pt has great carryover to gait with slower  gait speed, however he needs cues for this and if he increases gait speed, continues to demonstrate toe walking and increased risk of falling.     Rehab Potential  Good    Clinical Impairments Affecting Rehab Potential  see above.     PT Frequency  2x / week    PT Duration  8 weeks    PT Treatment/Interventions  ADLs/Self Care Home Management;Biofeedback;Therapeutic exercise;Therapeutic activities;Manual techniques;Electrical Stimulation;Functional mobility training;Stair training;Gait training;Orthotic Fit/Training;Patient/family education;Neuromuscular re-education;Balance training;Vestibular    PT Next Visit Plan  Bioness LLE lower cuff (try R at next session); endurance on elliptical, Provide fall prevention handout. Continue stretching, balance, and strengthening HEP.  Perform external pertubations to improve reaction time during LOB.     Consulted and Agree with Plan of Care  Patient       Patient will benefit from skilled therapeutic intervention in order to improve the following deficits and impairments:  Abnormal gait, Decreased endurance, Decreased coordination, Impaired flexibility, Postural dysfunction, Decreased range of motion, Decreased balance, Decreased mobility, Impaired tone, Decreased knowledge of use of DME, Decreased strength  Visit Diagnosis: Other lack of coordination  Other abnormalities of gait and mobility  Muscle weakness (generalized)     Problem List Patient Active Problem List   Diagnosis Date Noted  . Cerebral palsy (HCC) 09/30/2017  . PCP NOTES>>>>>>>>>>>>>>>>>>>> 05/16/2016  . Skin lesion 12/23/2015  . Hyperglycemia 01/27/2014  . Hemorrhoids   . Groin pain 02/08/2012  . Annual physical exam 05/21/2011  . Hyperlipidemia 11/03/2010  . BACK PAIN 02/09/2010  . Essential hypertension 12/21/2009     , PT, MPT [facility] [address] [place], [place], [address] Phone: [phone]   Fax:   [phone] 12/23/17, 1:11 PM  Name: Adrian Schneider MRN: [id number] Date of Birth: 11/26/1965   

## 2017-12-24 ENCOUNTER — Other Ambulatory Visit: Payer: Self-pay | Admitting: Internal Medicine

## 2017-12-24 ENCOUNTER — Encounter: Payer: Self-pay | Admitting: Internal Medicine

## 2017-12-26 ENCOUNTER — Encounter: Payer: Self-pay | Admitting: Rehabilitation

## 2017-12-26 ENCOUNTER — Ambulatory Visit: Payer: BLUE CROSS/BLUE SHIELD | Admitting: Rehabilitation

## 2017-12-26 DIAGNOSIS — R278 Other lack of coordination: Secondary | ICD-10-CM | POA: Diagnosis not present

## 2017-12-26 DIAGNOSIS — R2689 Other abnormalities of gait and mobility: Secondary | ICD-10-CM | POA: Diagnosis not present

## 2017-12-26 DIAGNOSIS — M6281 Muscle weakness (generalized): Secondary | ICD-10-CM

## 2017-12-26 NOTE — Therapy (Signed)
[facility] [address] [place], [place], [address] Phone: [phone]   Fax:  [phone]  Physical Therapy Treatment  Patient Details  Name: Adrian Schneider MRN: [id number] Date of Birth: [birth year] Referring Provider: Dr. Letta Pate   Encounter Date: 12/26/2017  PT End of Session - 12/26/17 1201    Visit Number  11    Number of Visits  17    Date for PT Re-Evaluation  01/05/18 [employer]    Authorization Type  BCBS    PT Start Time  1103    PT Stop Time  1147    PT Time Calculation (min)  44 min    Activity Tolerance  Patient tolerated treatment well    Behavior During Therapy  [facility] for tasks assessed/performed       Past Medical History:  Diagnosis Date  . Cerebral palsy ([place])    dx at birth, has poor balance  . Hemorrhoids   . Hyperlipidemia 6/11   started meds  . Hypertension 12/2009  . Left ankle pain    MRI in 06/2015 did not show specific internal derangement, had mild degenerative chondral thinning in the tibiotalar joint w/o a focal lesion  . MVP (mitral valve prolapse)    dx years ago  . Wart    L foot, non- healing lesion, f/u by derm    Past Surgical History:  Procedure Laterality Date  . LEG SURGERY Bilateral    release muscles d/t "cerebral palsy"    There were no vitals filed for this visit.  Subjective Assessment - 12/26/17 1108    Subjective  Pt fell early this morning.  Fell over extension cord.  Educated on fall prevention.     Pertinent History  HTN, hx of CP, HLD, tendonitis in RLE (hip), hx of L lower leg nerve pain, recent botox in BLEs (10/31/17).    Patient Stated Goals  Be able to catch himself, walk without AD    Currently in Pain?  Yes    Pain Score  2     Pain Location  Hip    Pain Orientation  Right    Pain Descriptors / Indicators  Sore    Pain Type  Acute pain    Pain Onset  More than a month ago    Pain Frequency  Intermittent    Aggravating Factors   unsure    Pain  Relieving Factors  heat                      [facility] Adult PT Treatment/Exercise - 12/26/17 0001      Ambulation/Gait   Ambulation/Gait  Yes    Ambulation/Gait Assistance  5: Supervision;4: Min guard    Ambulation/Gait Assistance Details  Assessed gait over varying outdoor surfaces.  Pt able to ambulate >1000' over grassy, uneven, uphill/downhill, paved areas at S to min/guard level.  For the most part, pt safe, however note increased steppage gait pattern and had two instances of catches in which he was able to self recover but PT provided min/guard for safety.  Recommend he continue to slow gait speed for improved safety with uneven surfaces.      Ambulation Distance (Feet)  1000 Feet    Assistive device  None    Gait Pattern  Step-through pattern;Decreased stride length;Decreased dorsiflexion - right;Decreased dorsiflexion - left;Right hip hike;Poor foot clearance - right;Decreased arm swing - right;Decreased hip/knee flexion - left;Ataxic;Poor foot clearance - left  Ambulation Surface  Level;Unlevel;Indoor;Outdoor;Paved;Grass    Curb  5: Supervision    Curb Details (indicate cue type and reason)  Pt performed curb step several times during session.  He is able to complete at S level, however note single instance in which toe catches, but no LOB.        Therapeutic Activites    Therapeutic Activities  Other Therapeutic Activities    Other Therapeutic Activities  Attempted to simulate going up/down bleacher steps for LTG during session using two 8" steps beside stairs without use of rail.  Had him complete several reps without UE support both forwards and sideways to determine which is safer.  Pt seems to be safer with forwards movement vs sidways, but he tends to use momentum when doing stairs (ascending) and this causes him to lose balance.  Recommend he work on stairs (that have a rail) in order to improve technqiue and BLE strength.  Pt also reports difficulty negotiating  through people seated on bleachers.  PT laid out agility ladder and had pt ambulate sideways over ladder.  Pt with moderate difficulty with task.        Neuro Re-ed    Neuro Re-ed Details   Performed high knees going forwards along agility ladder.  Note marked difficulty in task due to poor ability to perform SLS.  Feel that much of it is due to poor ankle range causing decreased ankle strategy.  Therefore had pt perform SLS in corner x 3 reps on each side for 5-10 secs.  Again, note marked difficulty (L>R) therefore added to HEP.  Also performed tandem stance in corner.  Note marked difficulty but pt was able to improve within task with increased practice, therefore added this to HEP as well.  Ended session with braiding task along counter top x 4 reps to address stepping strategy as he continues to have difficulty with balance recovery when he has LOB.               PT Education - 12/26/17 1201    Education provided  Yes    Education Details  balance strategies, additional balance exercises for HEP    Person(s) Educated  Patient    Methods  Explanation    Comprehension  Verbalized understanding       PT Short Term Goals - 12/05/17 1025      PT SHORT TERM GOAL #1   Title  Pt will be IND be HEP to improve strength, flexibility, balance, and endurance. TARGET DATE FOR ALL STGS: 12/04/17    Status  On-going      PT SHORT TERM GOAL #2   Title  Pt will improve FGA score to >/=22/30 to decr. falls risk.     Baseline  21/30    Status  Partially Met      PT SHORT TERM GOAL #3   Title  Perform 6MWT and write goals as indicated.     Baseline  met    Status  Achieved      PT SHORT TERM GOAL #4   Title  Pt will amb. 500' over even terrain, while performing head turns, in order to improve safety during functional mobility.     Baseline  supervision due to veering, pt with significant SOB    Status  Partially Met        PT Long Term Goals - 12/26/17 1119      PT LONG TERM GOAL #1    Title  Pt will  verbalize understanding of fall prevention strategies to decr. falls risk. TARGET DATE FOR ALL LTGS: 01/01/18    Status  On-going      PT LONG TERM GOAL #2   Title  Pt will improve FGA score to >/=24/30 to decr. falls risk.     Baseline  21/30 at 4 weeks    Status  Revised      PT LONG TERM GOAL #3   Title  Pt will amb. 1000' over uneven terrain outdoors, at MOD I level, in order to improve safety during functional mobility.     Status  On-going      PT LONG TERM GOAL #4   Title  Pt will traverse 12 steps without handrail with S to safely negotiate bleacher steps at events.    Status  On-going      PT LONG TERM GOAL #5   Title  Trial AD and recommend appropriate device, as tolerated by pt.     Status  On-going      PT LONG TERM GOAL #6   Title  Pt will improve ambulation endurance and distance on 6 minute walk test by 150 feet rating 12-13 on BORG RPE    Baseline  1383'; 13 on RPE    Status  New            Plan - 12/26/17 1202    Clinical Impression Statement  Skilled session focused on safety with gait outdoors over varying surfaces, simulating negotiation up/down and around people on bleachers, and balance strategies.  Pt making good progress, however is slower than patient would like.      Rehab Potential  Good    Clinical Impairments Affecting Rehab Potential  see above.     PT Frequency  2x / week    PT Duration  8 weeks    PT Treatment/Interventions  ADLs/Self Care Home Management;Biofeedback;Therapeutic exercise;Therapeutic activities;Manual techniques;Electrical Stimulation;Functional mobility training;Stair training;Gait training;Orthotic Fit/Training;Patient/family education;Neuromuscular re-education;Balance training;Vestibular    PT Next Visit Plan  Do resisted Domenic Polite help because I want him to almost fall to kick in stepping strategy. Bioness LLE lower cuff (try R at next session); endurance on elliptical, Provide fall prevention handout.  Continue stretching, balance, and strengthening HEP.  Perform external pertubations to improve reaction time during LOB.     Consulted and Agree with Plan of Care  Patient       Patient will benefit from skilled therapeutic intervention in order to improve the following deficits and impairments:  Abnormal gait, Decreased endurance, Decreased coordination, Impaired flexibility, Postural dysfunction, Decreased range of motion, Decreased balance, Decreased mobility, Impaired tone, Decreased knowledge of use of DME, Decreased strength  Visit Diagnosis: Other lack of coordination  Other abnormalities of gait and mobility  Muscle weakness (generalized)     Problem List Patient Active Problem List   Diagnosis Date Noted  . Cerebral palsy ([place]) 09/30/2017  . PCP NOTES>>>>>>>>>>>>>>>>>>>> 05/16/2016  . Skin lesion 12/23/2015  . Hyperglycemia 01/27/2014  . Hemorrhoids   . Groin pain 02/08/2012  . Annual physical exam 05/21/2011  . Hyperlipidemia 11/03/2010  . BACK PAIN 02/09/2010  . Essential hypertension 12/21/2009    Cameron Sprang, PT, MPT [facility] [address] [place], [place], [address] Phone: [phone]   Fax:  [phone] 12/26/17, 12:21 PM  Name: MAYKEL REITTER MRN: [id number] Date of Birth: 02/19/1966

## 2017-12-26 NOTE — Patient Instructions (Signed)
Stand in corner with chair in front of you.   SINGLE LIMB STANCE    Stance: single leg on floor. Raise leg. Hold __5-10_ seconds. Repeat with other leg. _3__ reps per set on each side, _2__ sets per day, _5-7__ days per week  Copyright  VHI. All rights reserved.    Tandem Stance    Right foot in front of left, heel touching toe both feet "straight ahead". Stand on Foot Triangle of Support with both feet. Balance in this position __5-10_ seconds.  Do 3 reps on each side, 1-2 times per day.  Do with left foot in front of right.  Copyright  VHI. All rights reserved.

## 2018-01-02 ENCOUNTER — Encounter: Payer: Self-pay | Admitting: Physical Therapy

## 2018-01-02 ENCOUNTER — Ambulatory Visit: Payer: BLUE CROSS/BLUE SHIELD | Admitting: Physical Therapy

## 2018-01-02 DIAGNOSIS — M6281 Muscle weakness (generalized): Secondary | ICD-10-CM | POA: Diagnosis not present

## 2018-01-02 DIAGNOSIS — R278 Other lack of coordination: Secondary | ICD-10-CM

## 2018-01-02 DIAGNOSIS — R2689 Other abnormalities of gait and mobility: Secondary | ICD-10-CM

## 2018-01-02 NOTE — Therapy (Signed)
[facility] [address] [place], [place], [address] Phone: [phone]   Fax:  [phone]  Physical Therapy Treatment  Patient Details  Name: Adrian Schneider MRN: [id number] Date of Birth: 1966-11-01 Referring Provider: Dr. Letta Pate   Encounter Date: 01/02/2018  PT End of Session - 01/02/18 1157    Visit Number  12    Number of Visits  17    Date for PT Re-Evaluation  01/05/18 [employer]    Authorization Type  BCBS    PT Start Time  1105    PT Stop Time  1145    PT Time Calculation (min)  40 min    Activity Tolerance  Patient tolerated treatment well    Behavior During Therapy  [facility] for tasks assessed/performed       Past Medical History:  Diagnosis Date  . Cerebral palsy ([place])    dx at birth, has poor balance  . Hemorrhoids   . Hyperlipidemia 6/11   started meds  . Hypertension 12/2009  . Left ankle pain    MRI in 06/2015 did not show specific internal derangement, had mild degenerative chondral thinning in the tibiotalar joint w/o a focal lesion  . MVP (mitral valve prolapse)    dx years ago  . Wart    L foot, non- healing lesion, f/u by derm    Past Surgical History:  Procedure Laterality Date  . LEG SURGERY Bilateral    release muscles d/t "cerebral palsy"    There were no vitals filed for this visit.  Subjective Assessment - 01/02/18 1107    Subjective  No falls since last visit. Very tight this week due to mostly resting due to being sick.    Pertinent History  HTN, hx of CP, HLD, tendonitis in RLE (hip), hx of L lower leg nerve pain, recent botox in BLEs (10/31/17).    Patient Stated Goals  Be able to catch himself, walk without AD    Currently in Pain?  Yes    Pain Score  2     Pain Location  Hip    Pain Orientation  Right    Pain Descriptors / Indicators  Sore    Pain Onset  More than a month ago    Pain Frequency  Intermittent                      [facility] Adult PT  Treatment/Exercise - 01/02/18 0001      Ambulation/Gait   Ambulation/Gait  Yes    Ambulation/Gait Assistance  5: Supervision    Ambulation/Gait Assistance Details  trialled Foot Up Brace on Left ankle to assist with Left foot clearance    Ambulation Distance (Feet)  115 Feet    Assistive device  None    Gait Pattern  Step-through pattern;Decreased stride length;Decreased dorsiflexion - right;Decreased dorsiflexion - left;Right hip hike;Poor foot clearance - right;Decreased arm swing - right;Decreased hip/knee flexion - left;Ataxic;Poor foot clearance - left    Ambulation Surface  Level;Indoor      Knee/Hip Exercises: Stretches   Active Hamstring Stretch  Both;2 reps;30 seconds in standing    Gastroc Stretch  Both;2 reps;60 seconds      Knee/Hip Exercises: Aerobic   Stepper  seated stepper for warm up: L 4.0, 8 min          Balance Exercises - 01/02/18 1154      Balance Exercises: Standing   Stepping Strategy  Anterior;Posterior;Foam/compliant surface;UE support foam  under Right foot to practice Left foot clearance. cues          PT Short Term Goals - 12/05/17 1025      PT SHORT TERM GOAL #1   Title  Pt will be IND be HEP to improve strength, flexibility, balance, and endurance. TARGET DATE FOR ALL STGS: 12/04/17    Status  On-going      PT SHORT TERM GOAL #2   Title  Pt will improve FGA score to >/=22/30 to decr. falls risk.     Baseline  21/30    Status  Partially Met      PT SHORT TERM GOAL #3   Title  Perform 6MWT and write goals as indicated.     Baseline  met    Status  Achieved      PT SHORT TERM GOAL #4   Title  Pt will amb. 500' over even terrain, while performing head turns, in order to improve safety during functional mobility.     Baseline  supervision due to veering, pt with significant SOB    Status  Partially Met        PT Long Term Goals - 12/26/17 1119      PT LONG TERM GOAL #1   Title  Pt will verbalize understanding of fall prevention  strategies to decr. falls risk. TARGET DATE FOR ALL LTGS: 01/01/18    Status  On-going      PT LONG TERM GOAL #2   Title  Pt will improve FGA score to >/=24/30 to decr. falls risk.     Baseline  21/30 at 4 weeks    Status  Revised      PT LONG TERM GOAL #3   Title  Pt will amb. 1000' over uneven terrain outdoors, at MOD I level, in order to improve safety during functional mobility.     Status  On-going      PT LONG TERM GOAL #4   Title  Pt will traverse 12 steps without handrail with S to safely negotiate bleacher steps at events.    Status  On-going      PT LONG TERM GOAL #5   Title  Trial AD and recommend appropriate device, as tolerated by pt.     Status  On-going      PT LONG TERM GOAL #6   Title  Pt will improve ambulation endurance and distance on 6 minute walk test by 150 feet rating 12-13 on BORG RPE    Baseline  1383'; 13 on RPE    Status  New            Plan - 01/02/18 1158    Clinical Impression Statement  Addressed pt's LE tightness, due to being sick and not as active since last visit, warming up on Sci fit stepper and standing LE stretching.  Worked on L foot clearance during stepping strategy activity  giving cues for posture and foot placement, progressing with 1 UE support.   Pt was willing to trial Foot Up brace for L foot clearance but noted no increase with L foot clearance.  Rehab Potential  Good    Clinical Impairments Affecting Rehab Potential  see above.     PT Frequency  2x / week    PT Duration  8 weeks    PT Treatment/Interventions  ADLs/Self Care Home Management;Biofeedback;Therapeutic exercise;Therapeutic activities;Manual techniques;Electrical Stimulation;Functional mobility training;Stair training;Gait training;Orthotic Fit/Training;Patient/family education;Neuromuscular re-education;Balance training;Vestibular    PT Next Visit Plan  Do resisted Domenic Polite help  because I want him to almost fall to kick in stepping strategy. Bioness LLE lower cuff (try R at next session); endurance on elliptical, Provide fall prevention handout. Continue stretching, balance, and strengthening HEP.  Perform external pertubations to improve reaction time during LOB.     Consulted and Agree with Plan of Care  Patient       Patient will benefit from skilled therapeutic intervention in order to improve the following deficits and impairments:  Abnormal gait, Decreased endurance, Decreased coordination, Impaired flexibility, Postural dysfunction, Decreased range of motion, Decreased balance, Decreased mobility, Impaired tone, Decreased knowledge of use of DME, Decreased strength  Visit Diagnosis: Other lack of coordination  Other abnormalities of gait and mobility  Muscle weakness (generalized)     Problem List Patient Active Problem List   Diagnosis Date Noted  . Cerebral palsy ([place]) 09/30/2017  . PCP NOTES>>>>>>>>>>>>>>>>>>>> 05/16/2016  . Skin lesion 12/23/2015  . Hyperglycemia 01/27/2014  . Hemorrhoids   . Groin pain 02/08/2012  . Annual physical exam 05/21/2011  . Hyperlipidemia 11/03/2010  . BACK PAIN 02/09/2010  . Essential hypertension 12/21/2009    Bjorn Loser, PTA  01/02/18, 12:05 PM [facility] [address] [place], [place], [address] Phone: [phone]   Fax:  [phone]  Name: Adrian Schneider MRN: [id number] Date of Birth: 1965-12-22

## 2018-01-03 ENCOUNTER — Ambulatory Visit: Payer: BLUE CROSS/BLUE SHIELD | Admitting: Rehabilitation

## 2018-01-03 ENCOUNTER — Encounter: Payer: Self-pay | Admitting: Rehabilitation

## 2018-01-03 DIAGNOSIS — R278 Other lack of coordination: Secondary | ICD-10-CM

## 2018-01-03 DIAGNOSIS — R2689 Other abnormalities of gait and mobility: Secondary | ICD-10-CM | POA: Diagnosis not present

## 2018-01-03 DIAGNOSIS — M6281 Muscle weakness (generalized): Secondary | ICD-10-CM | POA: Diagnosis not present

## 2018-01-03 NOTE — Therapy (Signed)
[facility] [address] [place], [place], [address] Phone: [phone]   Fax:  [phone]  Physical Therapy Treatment  Patient Details  Name: Adrian Schneider MRN: [id number] Date of Birth: Jun 27, 1966 Referring Provider: Dr. Letta Pate   Encounter Date: 01/03/2018  PT End of Session - 01/03/18 1255    Visit Number  13    Number of Visits  17    Date for PT Re-Evaluation  01/12/18 updated due to missed one week at beginning of POC    Authorization Type  BCBS    PT Start Time  0928    PT Stop Time  1014    PT Time Calculation (min)  46 min    Activity Tolerance  Patient tolerated treatment well    Behavior During Therapy  [facility] for tasks assessed/performed       Past Medical History:  Diagnosis Date  . Cerebral palsy ([place])    dx at birth, has poor balance  . Hemorrhoids   . Hyperlipidemia 6/11   started meds  . Hypertension 12/2009  . Left ankle pain    MRI in 06/2015 did not show specific internal derangement, had mild degenerative chondral thinning in the tibiotalar joint w/o a focal lesion  . MVP (mitral valve prolapse)    dx years ago  . Wart    L foot, non- healing lesion, f/u by derm    Past Surgical History:  Procedure Laterality Date  . LEG SURGERY Bilateral    release muscles d/t "cerebral palsy"    There were no vitals filed for this visit.  Subjective Assessment - 01/03/18 0932    Subjective  Continues to report feeling tight this week due to not stretching as much.      Pertinent History  HTN, hx of CP, HLD, tendonitis in RLE (hip), hx of L lower leg nerve pain, recent botox in BLEs (10/31/17).    Patient Stated Goals  Be able to catch himself, walk without AD    Currently in Pain?  Yes    Pain Score  2     Pain Location  Leg    Pain Orientation  Right    Pain Descriptors / Indicators  Sore    Pain Type  Chronic pain    Pain Onset  More than a month ago    Pain Frequency  Intermittent    Aggravating Factors   unsure    Pain Relieving Factors  heat                       [facility] Adult PT Treatment/Exercise - 01/03/18 1006      Ambulation/Gait   Ambulation/Gait  Yes    Ambulation/Gait Assistance  5: Supervision    Ambulation/Gait Assistance Details  Trialed use of L walk on AFO for L foot clearance during swing.  Pt reports that brace is uncomfortable at ankle and also gives pt feeling of tipping posterior.  Feel that this is due to 90 deg angle of brace and pts inability to get to 90 deg.  Discussed that there are PLS braces that only run posterior and would be more comfortable but feel he does need to improve range at ankle to increase comfort of brace.     Ambulation Distance (Feet)  200 Feet    Assistive device  None    Gait Pattern  Step-through pattern;Decreased stride length;Decreased dorsiflexion - right;Decreased dorsiflexion - left;Right hip hike;Poor foot clearance -  right;Decreased arm swing - right;Decreased hip/knee flexion - left;Ataxic;Poor foot clearance - left    Ambulation Surface  Level;Indoor      Self-Care   Self-Care  Other Self-Care Comments    Other Self-Care Comments   Had lengthy discussion with pt regarding D/C at end of next week.  Feel that he has appropriate HEP, is going to gym and is having less falls/catches in LLE.  He is to get botox again in March and hopefully this will continue to allow more range at ankles and knees.  Discussed that he should ask MD what other options would be if botox does not work.  Pt verbalized understanding and is in agreement with D/c next week.  Answered questions regarding bracing and discussed return in the future if he feels he has regressed or feels that bracing is needed.        Exercises   Exercises  Other Exercises    Other Exercises   B hamstring stretch (passive with assist from therapist) x 3 mins on each side.  B piriformis stretch x 1 min each.  Calf stretch on stairs x 3 reps of 30 secs with 5  reps of heel raises in between.  Standing on BOSU (black top) performing circular motion for ankle ROM and stability x 10 reps each direction ending with squats on bosu with cues for increased hip and knee extension at top of movement.            Balance Exercises - 01/02/18 1154      Balance Exercises: Standing   Stepping Strategy  Anterior;Posterior;Foam/compliant surface;UE support foam under Right foot to practice Left foot clearance. cues        PT Education - 01/03/18 0941    Education provided  Yes    Education Details  use of brace, D/C next week.     Person(s) Educated  Patient    Methods  Explanation    Comprehension  Verbalized understanding       PT Short Term Goals - 12/05/17 1025      PT SHORT TERM GOAL #1   Title  Pt will be IND be HEP to improve strength, flexibility, balance, and endurance. TARGET DATE FOR ALL STGS: 12/04/17    Status  On-going      PT SHORT TERM GOAL #2   Title  Pt will improve FGA score to >/=22/30 to decr. falls risk.     Baseline  21/30    Status  Partially Met      PT SHORT TERM GOAL #3   Title  Perform 6MWT and write goals as indicated.     Baseline  met    Status  Achieved      PT SHORT TERM GOAL #4   Title  Pt will amb. 500' over even terrain, while performing head turns, in order to improve safety during functional mobility.     Baseline  supervision due to veering, pt with significant SOB    Status  Partially Met        PT Long Term Goals - 12/26/17 1119      PT LONG TERM GOAL #1   Title  Pt will verbalize understanding of fall prevention strategies to decr. falls risk. TARGET DATE FOR ALL LTGS: 01/01/18    Status  On-going      PT LONG TERM GOAL #2   Title  Pt will improve FGA score to >/=24/30 to decr. falls risk.     Baseline  21/30 at 4 weeks    Status  Revised      PT LONG TERM GOAL #3   Title  Pt will amb. 1000' over uneven terrain outdoors, at MOD I level, in order to improve safety during functional  mobility.     Status  On-going      PT LONG TERM GOAL #4   Title  Pt will traverse 12 steps without handrail with S to safely negotiate bleacher steps at events.    Status  On-going      PT LONG TERM GOAL #5   Title  Trial AD and recommend appropriate device, as tolerated by pt.     Status  On-going      PT LONG TERM GOAL #6   Title  Pt will improve ambulation endurance and distance on 6 minute walk test by 150 feet rating 12-13 on BORG RPE    Baseline  1383'; 13 on RPE    Status  New            Plan - 01/03/18 1258    Clinical Impression Statement  Skilled session focused on assessment of gait with walk on AFO for LLE clearance.  Note discomfort at ankle likely due to foot alignment and poor ankle ROM.  Also discused D/C at end of next week and progress from there on as well as continuing to perform stretching and strengthening to BLEs.  Pt in agreement with plan.      Rehab Potential  Good    Clinical Impairments Affecting Rehab Potential  see above.     PT Frequency  2x / week    PT Duration  8 weeks    PT Treatment/Interventions  ADLs/Self Care Home Management;Biofeedback;Therapeutic exercise;Therapeutic activities;Manual techniques;Electrical Stimulation;Functional mobility training;Stair training;Gait training;Orthotic Fit/Training;Patient/family education;Neuromuscular re-education;Balance training;Vestibular    PT Next Visit Plan  Work towards LTGs as he will DC after this week.  Do resisted Domenic Polite help because I want him to almost fall to kick in stepping strategy. Bioness LLE lower cuff (try R at next session); endurance on elliptical, Provide fall prevention handout. Continue stretching, balance, and strengthening HEP.  Perform external pertubations to improve reaction time during LOB.     Consulted and Agree with Plan of Care  Patient       Patient will benefit from skilled therapeutic intervention in order to improve the following deficits and impairments:   Abnormal gait, Decreased endurance, Decreased coordination, Impaired flexibility, Postural dysfunction, Decreased range of motion, Decreased balance, Decreased mobility, Impaired tone, Decreased knowledge of use of DME, Decreased strength  Visit Diagnosis: Other lack of coordination  Other abnormalities of gait and mobility  Muscle weakness (generalized)     Problem List Patient Active Problem List   Diagnosis Date Noted  . Cerebral palsy ([place]) 09/30/2017  . PCP NOTES>>>>>>>>>>>>>>>>>>>> 05/16/2016  . Skin lesion 12/23/2015  . Hyperglycemia 01/27/2014  . Hemorrhoids   . Groin pain 02/08/2012  . Annual physical exam 05/21/2011  . Hyperlipidemia 11/03/2010  . BACK PAIN 02/09/2010  . Essential hypertension 12/21/2009    Cameron Sprang, PT, MPT [facility] [address] [place], [place], [address] Phone: [phone]   Fax:  [phone] 01/03/18, 1:03 PM  Name: Adrian Schneider MRN: [id number] Date of Birth: 1966-08-20

## 2018-01-09 ENCOUNTER — Ambulatory Visit: Payer: BLUE CROSS/BLUE SHIELD | Admitting: Rehabilitation

## 2018-01-09 ENCOUNTER — Encounter: Payer: Self-pay | Admitting: Rehabilitation

## 2018-01-09 DIAGNOSIS — M6281 Muscle weakness (generalized): Secondary | ICD-10-CM

## 2018-01-09 DIAGNOSIS — R278 Other lack of coordination: Secondary | ICD-10-CM

## 2018-01-09 DIAGNOSIS — R2689 Other abnormalities of gait and mobility: Secondary | ICD-10-CM | POA: Diagnosis not present

## 2018-01-09 NOTE — Therapy (Signed)
[facility] [address] [place], [place], [address] Phone: [phone]   Fax:  [phone]  Physical Therapy Treatment  Patient Details  Name: Adrian Schneider MRN: [id number] Date of Birth: September 01, 1966 Referring Provider: Dr. Letta Pate   Encounter Date: 01/09/2018  PT End of Session - 01/09/18 1100    Visit Number  14    Number of Visits  17    Date for PT Re-Evaluation  01/12/18 updated due to missed one week at beginning of POC    Authorization Type  BCBS    PT Start Time  1018    PT Stop Time  1100    PT Time Calculation (min)  42 min    Activity Tolerance  Patient tolerated treatment well    Behavior During Therapy  [facility] for tasks assessed/performed       Past Medical History:  Diagnosis Date  . Cerebral palsy ([place])    dx at birth, has poor balance  . Hemorrhoids   . Hyperlipidemia 6/11   started meds  . Hypertension 12/2009  . Left ankle pain    MRI in 06/2015 did not show specific internal derangement, had mild degenerative chondral thinning in the tibiotalar joint w/o a focal lesion  . MVP (mitral valve prolapse)    dx years ago  . Wart    L foot, non- healing lesion, f/u by derm    Past Surgical History:  Procedure Laterality Date  . LEG SURGERY Bilateral    release muscles d/t "cerebral palsy"    There were no vitals filed for this visit.  Subjective Assessment - 01/09/18 1058    Subjective  Pt reports marked increased in R butt and upper post leg from doing foam roll on Sunday.     Pertinent History  HTN, hx of CP, HLD, tendonitis in RLE (hip), hx of L lower leg nerve pain, recent botox in BLEs (10/31/17).    Patient Stated Goals  Be able to catch himself, walk without AD    Currently in Pain?  Yes    Pain Score  6     Pain Location  Buttocks    Pain Orientation  Right    Pain Descriptors / Indicators  Sharp    Pain Type  Acute pain    Pain Onset  More than a month ago    Pain Frequency   Intermittent    Aggravating Factors   stretching too much, sitting, going from sit to stand    Pain Relieving Factors  heat                      [facility] Adult PT Treatment/Exercise - 01/09/18 0001      Self-Care   Self-Care  Other Self-Care Comments    Other Self-Care Comments   Spent entire session assessing for cause of sudden increase in R butt and posterior thigh pain.  Note that on Sunday he did foam roll deep tissue work to R hamstrings, IT band and quad.  We have only gone over how to perform IT band roll in therapy, however pt states he watched a video on how to perform.  Had PT student (who is more familiar with hamstring testing) come with PT to assess. PT worried about perhaps a partial hamstring tear due to over stretching.  Upon testing in several positions, palpations (showing marked tightness and trigger points), and many positions to provocate symptoms. Pt mostly stating that symptoms are  brought on by moving into sitting from lying down or moving into standing from sitting and sitting on chair at work.  Following all testing, feel that pts hamstrings are inflammed and pt over worked them with foam roll over the weekend.  Discussed use of ice to reduce inflammation, decreasing agressiveness of stretching, decreasing reps of stretching to allow inflammation to decrease.  Discussed how tone is also impacting this and the hopeful benefits of increased dose of botox in March will help.  Pt verbalized understanding to all.  Will be ready for D/C tomorrow.              PT Education - 01/09/18 1159    Education provided  Yes    Education Details  see self care    Person(s) Educated  Patient    Methods  Explanation    Comprehension  Verbalized understanding       PT Short Term Goals - 12/05/17 1025      PT SHORT TERM GOAL #1   Title  Pt will be IND be HEP to improve strength, flexibility, balance, and endurance. TARGET DATE FOR ALL STGS: 12/04/17    Status   On-going      PT SHORT TERM GOAL #2   Title  Pt will improve FGA score to >/=22/30 to decr. falls risk.     Baseline  21/30    Status  Partially Met      PT SHORT TERM GOAL #3   Title  Perform 6MWT and write goals as indicated.     Baseline  met    Status  Achieved      PT SHORT TERM GOAL #4   Title  Pt will amb. 500' over even terrain, while performing head turns, in order to improve safety during functional mobility.     Baseline  supervision due to veering, pt with significant SOB    Status  Partially Met        PT Long Term Goals - 12/26/17 1119      PT LONG TERM GOAL #1   Title  Pt will verbalize understanding of fall prevention strategies to decr. falls risk. TARGET DATE FOR ALL LTGS: 01/01/18    Status  On-going      PT LONG TERM GOAL #2   Title  Pt will improve FGA score to >/=24/30 to decr. falls risk.     Baseline  21/30 at 4 weeks    Status  Revised      PT LONG TERM GOAL #3   Title  Pt will amb. 1000' over uneven terrain outdoors, at MOD I level, in order to improve safety during functional mobility.     Status  On-going      PT LONG TERM GOAL #4   Title  Pt will traverse 12 steps without handrail with S to safely negotiate bleacher steps at events.    Status  On-going      PT LONG TERM GOAL #5   Title  Trial AD and recommend appropriate device, as tolerated by pt.     Status  On-going      PT LONG TERM GOAL #6   Title  Pt will improve ambulation endurance and distance on 6 minute walk test by 150 feet rating 12-13 on BORG RPE    Baseline  1383'; 13 on RPE    Status  New            Plan - 01/09/18 1200    Clinical  Impression Statement  Skilled session focused on assessment of sudden increase in R buttock and R posterior thigh pain.  Following assessment, feel that he likely just over worked muscles causing increased inflammation with education on how to reduce at home.  Pt verbalized understanding and ready for D/C visit tomorrow.     Rehab  Potential  Good    Clinical Impairments Affecting Rehab Potential  see above.     PT Frequency  2x / week    PT Duration  8 weeks    PT Treatment/Interventions  ADLs/Self Care Home Management;Biofeedback;Therapeutic exercise;Therapeutic activities;Manual techniques;Electrical Stimulation;Functional mobility training;Stair training;Gait training;Orthotic Fit/Training;Patient/family education;Neuromuscular re-education;Balance training;Vestibular    PT Next Visit Plan  LTGs and D/C    Consulted and Agree with Plan of Care  Patient       Patient will benefit from skilled therapeutic intervention in order to improve the following deficits and impairments:  Abnormal gait, Decreased endurance, Decreased coordination, Impaired flexibility, Postural dysfunction, Decreased range of motion, Decreased balance, Decreased mobility, Impaired tone, Decreased knowledge of use of DME, Decreased strength  Visit Diagnosis: Other lack of coordination  Other abnormalities of gait and mobility  Muscle weakness (generalized)     Problem List Patient Active Problem List   Diagnosis Date Noted  . Cerebral palsy ([place]) 09/30/2017  . PCP NOTES>>>>>>>>>>>>>>>>>>>> 05/16/2016  . Skin lesion 12/23/2015  . Hyperglycemia 01/27/2014  . Hemorrhoids   . Groin pain 02/08/2012  . Annual physical exam 05/21/2011  . Hyperlipidemia 11/03/2010  . BACK PAIN 02/09/2010  . Essential hypertension 12/21/2009    Cameron Sprang, PT, MPT [facility] [address] [place], [place], [address] Phone: [phone]   Fax:  [phone] 01/09/18, 12:03 PM  Name: SAMIEL PEEL Schneider MRN: [id number] Date of Birth: [birth year]

## 2018-01-10 ENCOUNTER — Ambulatory Visit: Payer: BLUE CROSS/BLUE SHIELD | Admitting: Rehabilitation

## 2018-01-10 ENCOUNTER — Encounter: Payer: Self-pay | Admitting: Rehabilitation

## 2018-01-10 DIAGNOSIS — R2689 Other abnormalities of gait and mobility: Secondary | ICD-10-CM | POA: Diagnosis not present

## 2018-01-10 DIAGNOSIS — M6281 Muscle weakness (generalized): Secondary | ICD-10-CM | POA: Diagnosis not present

## 2018-01-10 DIAGNOSIS — R278 Other lack of coordination: Secondary | ICD-10-CM

## 2018-01-10 NOTE — Therapy (Addendum)
[facility] [address] [place], [place], [address] Phone: [phone]   Fax:  [phone]  Physical Therapy Treatment and D/C Summary   Patient Details  Name: Adrian Schneider MRN: [id number] Date of Birth: 02-17-1966 Referring Provider: Dr. Letta Pate   Encounter Date: 01/10/2018  PT End of Session - 01/10/18 0937    Visit Number  15    Number of Visits  17    Date for PT Re-Evaluation  01/12/18 updated due to missed one week at beginning of POC    Authorization Type  [employer]    PT Start Time  [phone]    PT Stop Time  1015    PT Time Calculation (min)  44 min    Activity Tolerance  Patient tolerated treatment well    Behavior During Therapy  [facility] for tasks assessed/performed       Past Medical History:  Diagnosis Date  . Cerebral palsy ([place])    dx at birth, has poor balance  . Hemorrhoids   . Hyperlipidemia 6/11   started meds  . Hypertension 12/2009  . Left ankle pain    MRI in 06/2015 did not show specific internal derangement, had mild degenerative chondral thinning in the tibiotalar joint w/o a focal lesion  . MVP (mitral valve prolapse)    dx years ago  . Wart    L foot, non- healing lesion, f/u by derm    Past Surgical History:  Procedure Laterality Date  . LEG SURGERY Bilateral    release muscles d/t "cerebral palsy"    There were no vitals filed for this visit.  Subjective Assessment - 01/10/18 0935    Subjective  Pt continues to have pain when sitting and standing, but okay when he is walking.     Pertinent History  HTN, hx of CP, HLD, tendonitis in RLE (hip), hx of L lower leg nerve pain, recent botox in BLEs (10/31/17).    Patient Stated Goals  Be able to catch himself, walk without AD    Currently in Pain?  Yes    Pain Score  5     Pain Location  Buttocks and back of thigh    Pain Orientation  Right    Pain Descriptors / Indicators  Sharp    Pain Type  Acute pain    Pain Onset  More than a  month ago    Pain Frequency  Intermittent    Aggravating Factors   sitting/standing, sitting too long    Pain Relieving Factors  ice, light stretching          [facility] PT Assessment - 01/10/18 0946      6 Minute Walk- Baseline   6 Minute Walk- Baseline  yes    BP (mmHg)  153/80    HR (bpm)  91    02 Sat (%RA)  94 %    Modified Borg Scale for Dyspnea  0- Nothing at all    Perceived Rate of Exertion (Borg)  6-      6 Minute walk- Post Test   6 Minute Walk Post Test  yes    BP (mmHg)  158/80    HR (bpm)  156    02 Sat (%RA)  96 %    Modified Borg Scale for Dyspnea  4- somewhat severe    Perceived Rate of Exertion (Borg)  13- Somewhat hard      6 minute walk test results    Aerobic Endurance  Distance Walked  1610    Endurance additional comments  improvement of 227' from STG      Functional Gait  Assessment   Gait assessed   Yes    Gait Level Surface  Walks 20 ft in less than 5.5 sec, no assistive devices, good speed, no evidence for imbalance, normal gait pattern, deviates no more than 6 in outside of the 12 in walkway width.    Change in Gait Speed  Able to smoothly change walking speed without loss of balance or gait deviation. Deviate no more than 6 in outside of the 12 in walkway width.    Gait with Horizontal Head Turns  Performs head turns smoothly with no change in gait. Deviates no more than 6 in outside 12 in walkway width    Gait with Vertical Head Turns  Performs head turns with no change in gait. Deviates no more than 6 in outside 12 in walkway width.    Gait and Pivot Turn  Pivot turns safely within 3 sec and stops quickly with no loss of balance.    Step Over Obstacle  Is able to step over one shoe box (4.5 in total height) without changing gait speed. No evidence of imbalance.    Gait with Narrow Base of Support  Ambulates less than 4 steps heel to toe or cannot perform without assistance.    Gait with Eyes Closed  Walks 20 ft, no assistive devices, good speed, no  evidence of imbalance, normal gait pattern, deviates no more than 6 in outside 12 in walkway width. Ambulates 20 ft in less than 7 sec.    Ambulating Backwards  Walks 20 ft, no assistive devices, good speed, no evidence for imbalance, normal gait    Steps  Alternating feet, no rail. would recommend S level    Total Score  26    FGA comment:  25-28 = low risk fall                           PT Education - 01/10/18 1209    Education provided  Yes    Education Details  LTG results    Person(s) Educated  Patient    Methods  Explanation    Comprehension  Verbalized understanding       PT Short Term Goals - 12/05/17 1025      PT SHORT TERM GOAL #1   Title  Pt will be IND be HEP to improve strength, flexibility, balance, and endurance. TARGET DATE FOR ALL STGS: 12/04/17    Status  On-going      PT SHORT TERM GOAL #2   Title  Pt will improve FGA score to >/=22/30 to decr. falls risk.     Baseline  21/30    Status  Partially Met      PT SHORT TERM GOAL #3   Title  Perform 6MWT and write goals as indicated.     Baseline  met    Status  Achieved      PT SHORT TERM GOAL #4   Title  Pt will amb. 500' over even terrain, while performing head turns, in order to improve safety during functional mobility.     Baseline  supervision due to veering, pt with significant SOB    Status  Partially Met        PT Long Term Goals - 01/10/18 0937      PT LONG TERM GOAL #1  Title  Pt will verbalize understanding of fall prevention strategies to decr. falls risk. TARGET DATE FOR ALL LTGS: 01/01/18    Baseline  met 2/22/198    Status  Achieved      PT LONG TERM GOAL #2   Title  Pt will improve FGA score to >/=24/30 to decr. falls risk.     Baseline  21/30 at 4 weeks to 26/30 on 01/10/18    Status  Achieved      PT LONG TERM GOAL #3   Title  Pt will amb. 1000' over uneven terrain outdoors, at MOD I level, in order to improve safety during functional mobility.     Baseline  met  01/10/18    Status  Achieved      PT LONG TERM GOAL #4   Title  Pt will traverse 12 steps without handrail with S to safely negotiate bleacher steps at events.    Baseline  met with S 01/10/18    Status  Achieved      PT LONG TERM GOAL #5   Title  Trial AD and recommend appropriate device, as tolerated by pt.     Baseline  pt unwilling to use AD at this time    Status  Deferred      PT LONG TERM GOAL #6   Title  Pt will improve ambulation endurance and distance on 6 minute walk test by 150 feet rating 12-13 on BORG RPE    Baseline  1383'; 13 on RPE, 1610' on 01/10/18 with 13 RPE    Status  Achieved            Plan - 01/10/18 1209    Clinical Impression Statement  Skilled session focused on assessment of LTGs.  Pt has met 5/6 LTGs, with goal for assessment of AD deferred as pt unwilling to try AD at this time.  Pt has made excellent progress despite new onset of R hamstring strain.  Ready for D/C.     Rehab Potential  Good    Clinical Impairments Affecting Rehab Potential  see above.     PT Frequency  2x / week    PT Duration  8 weeks    PT Treatment/Interventions  ADLs/Self Care Home Management;Biofeedback;Therapeutic exercise;Therapeutic activities;Manual techniques;Electrical Stimulation;Functional mobility training;Stair training;Gait training;Orthotic Fit/Training;Patient/family education;Neuromuscular re-education;Balance training;Vestibular    Consulted and Agree with Plan of Care  Patient       Patient will benefit from skilled therapeutic intervention in order to improve the following deficits and impairments:  Abnormal gait, Decreased endurance, Decreased coordination, Impaired flexibility, Postural dysfunction, Decreased range of motion, Decreased balance, Decreased mobility, Impaired tone, Decreased knowledge of use of DME, Decreased strength  Visit Diagnosis: Other lack of coordination  Other abnormalities of gait and mobility  Muscle weakness  (generalized)  PHYSICAL THERAPY DISCHARGE SUMMARY  Visits from Start of Care: 15  Current functional level related to goals / functional outcomes: See above   Remaining deficits: Continues to have gait and balance deficits related to BLE tightness and tone.  Pt to get another botox injection in March.  Continue HEP.    Education / Equipment: HEP  Plan: Patient agrees to discharge.  Patient goals were met. Patient is being discharged due to meeting the stated rehab goals.  ?????        Problem List Patient Active Problem List   Diagnosis Date Noted  . Cerebral palsy ([place]) 09/30/2017  . PCP NOTES>>>>>>>>>>>>>>>>>>>> 05/16/2016  . Skin lesion 12/23/2015  .  Hyperglycemia 01/27/2014  . Hemorrhoids   . Groin pain 02/08/2012  . Annual physical exam 05/21/2011  . Hyperlipidemia 11/03/2010  . BACK PAIN 02/09/2010  . Essential hypertension 12/21/2009    Cameron Sprang, PT, MPT [facility] [address] [place], [place], [address] Phone: [phone]   Fax:  [phone] 01/10/18, 12:13 PM  Name: Adrian Schneider MRN: [id number] Date of Birth: [birth year]

## 2018-01-13 ENCOUNTER — Ambulatory Visit: Payer: BLUE CROSS/BLUE SHIELD | Admitting: Rehabilitation

## 2018-01-15 ENCOUNTER — Ambulatory Visit: Payer: BLUE CROSS/BLUE SHIELD | Admitting: Physical Therapy

## 2018-01-16 ENCOUNTER — Ambulatory Visit: Payer: BLUE CROSS/BLUE SHIELD | Admitting: Physical Therapy

## 2018-01-17 ENCOUNTER — Ambulatory Visit: Payer: BLUE CROSS/BLUE SHIELD | Admitting: Rehabilitation

## 2018-01-18 ENCOUNTER — Encounter: Payer: Self-pay | Admitting: Internal Medicine

## 2018-01-18 ENCOUNTER — Encounter: Payer: Self-pay | Admitting: Physical Medicine & Rehabilitation

## 2018-01-31 ENCOUNTER — Encounter: Payer: Self-pay | Admitting: Physical Medicine & Rehabilitation

## 2018-01-31 ENCOUNTER — Ambulatory Visit (HOSPITAL_BASED_OUTPATIENT_CLINIC_OR_DEPARTMENT_OTHER): Payer: BLUE CROSS/BLUE SHIELD | Admitting: Physical Medicine & Rehabilitation

## 2018-01-31 ENCOUNTER — Other Ambulatory Visit: Payer: Self-pay | Admitting: Physical Medicine & Rehabilitation

## 2018-01-31 ENCOUNTER — Encounter: Payer: BLUE CROSS/BLUE SHIELD | Attending: Physical Medicine & Rehabilitation

## 2018-01-31 VITALS — BP 135/81 | HR 86

## 2018-01-31 DIAGNOSIS — G801 Spastic diplegic cerebral palsy: Secondary | ICD-10-CM | POA: Insufficient documentation

## 2018-01-31 MED ORDER — BACLOFEN 10 MG PO TABS: 5.0000 mg | ORAL_TABLET | Freq: Three times a day (TID) | ORAL | 1 refills | Status: DC

## 2018-01-31 NOTE — Progress Notes (Signed)
Botox Injection for spasticity using needle EMG guidance  Dilution: 50 Units/ml Indication: Severe spasticity which interferes with ADL,mobility and/or  hygiene and is unresponsive to medication management and other conservative care Informed consent was obtained after describing risks and benefits of the procedure with the patient. This includes bleeding, bruising, infection, excessive weakness, or medication side effects. A REMS form is on file and signed. Needle: 25g 2" needle electrode Number of units per muscle  GastrosoleusRight 100, Left 100 Right Hamstrings200 ( 150U to medial and 50 U to lateral ) All injections were done after obtaining appropriate EMG activity and after negative drawback for blood. The patient tolerated the procedure well. Post procedure instructions were given. A followup appointment was made.   Resume gluteus strengthening in 1 week, may do stationary upright bicycling, advise against hamstring stretching for the next 6 weeks

## 2018-01-31 NOTE — Patient Instructions (Signed)
May resume stretching of calf in one week No hamstring stretches until next visitMay do gluteus strengthening in 1 wk

## 2018-03-17 ENCOUNTER — Ambulatory Visit (HOSPITAL_BASED_OUTPATIENT_CLINIC_OR_DEPARTMENT_OTHER): Payer: BLUE CROSS/BLUE SHIELD | Admitting: Physical Medicine & Rehabilitation

## 2018-03-17 ENCOUNTER — Encounter: Payer: BLUE CROSS/BLUE SHIELD | Attending: Physical Medicine & Rehabilitation

## 2018-03-17 ENCOUNTER — Other Ambulatory Visit: Payer: Self-pay

## 2018-03-17 ENCOUNTER — Telehealth: Payer: Self-pay | Admitting: Physical Medicine & Rehabilitation

## 2018-03-17 VITALS — BP 103/72 | HR 93 | Ht 68.0 in | Wt 168.2 lb

## 2018-03-17 DIAGNOSIS — G801 Spastic diplegic cerebral palsy: Secondary | ICD-10-CM | POA: Insufficient documentation

## 2018-03-17 NOTE — Progress Notes (Addendum)
Subjective:    Patient ID: Adrian Schneider, male    DOB: 1966/05/11, 52 y.o.   MRN: [id number]  HPI 3/15 Botox  GastrosoleusRight 100, Left 100 Right Hamstrings200 ( 150U to medial and 50 U to lateral )  Had shooting pain left ankle on 4/5 Has not been working out regularly.  Discussed exercise program stationary bike Has high hamstring tendinopathy   Pain Inventory Average Pain 3 Pain Right Now 3 My pain is n/a  In the last 24 hours, has pain interfered with the following? General activity 4 Relation with others 2 Enjoyment of life 3 What TIME of day is your pain at its worst? none Sleep (in general) n/a  Pain is worse with: sitting and standing Pain improves with: n/a Relief from Meds: n/a  Mobility walk without assistance ability to climb steps?  yes do you drive?  yes  Function employed # of hrs/week 40  Neuro/Psych No problems in this area  Prior Studies Any changes since last visit?  no  Physicians involved in your care Any changes since last visit?  no   Family History  Problem Relation Age of Onset  . Coronary artery disease Mother   . Hypertension Mother   . Coronary artery disease Maternal Grandmother   . Hypertension Father   . Diabetes Father   . Stroke Neg Hx   . Colon cancer Neg Hx   . Prostate cancer Neg Hx   . Esophageal cancer Neg Hx   . Stomach cancer Neg Hx   . Rectal cancer Neg Hx    Social History   Socioeconomic History  . Marital status: Single    Spouse name: Not on file  . Number of children: 0  . Years of education: Not on file  . Highest education level: Not on file  Occupational History  . Occupation: Chief Strategy Officer @ [employer]    Employer: [employer]  Social Needs  . Financial resource strain: Not on file  . Food insecurity:    Worry: Not on file    Inability: Not on file  . Transportation needs:    Medical: Not on file    Non-medical: Not on file  Tobacco Use  . Smoking status: Never Smoker  . Smokeless tobacco:  Never Used  Substance and Sexual Activity  . Alcohol use: Yes    Alcohol/week: 0.0 oz    Comment: socially; weekly 1-2 beers weekly  . Drug use: No  . Sexual activity: Not on file  Lifestyle  . Physical activity:    Days per week: Not on file    Minutes per session: Not on file  . Stress: Not on file  Relationships  . Social connections:    Talks on phone: Not on file    Gets together: Not on file    Attends religious service: Not on file    Active member of club or organization: Not on file    Attends meetings of clubs or organizations: Not on file    Relationship status: Not on file  Other Topics Concern  . Not on file  Social History Narrative   Single, no children, lives by himself   Past Surgical History:  Procedure Laterality Date  . LEG SURGERY Bilateral    release muscles d/t "cerebral palsy"   Past Medical History:  Diagnosis Date  . Cerebral palsy ([place])    dx at birth, has poor balance  . Hemorrhoids   . Hyperlipidemia 6/11  started meds  . Hypertension 12/2009  . Left ankle pain    MRI in 06/2015 did not show specific internal derangement, had mild degenerative chondral thinning in the tibiotalar joint w/o a focal lesion  . MVP (mitral valve prolapse)    dx years ago  . Wart    L foot, non- healing lesion, f/u by derm   BP 103/72   Pulse 93   Ht 5\' 8"  (1.727 m) Comment: pt reported  Wt 168 lb 3.2 oz (76.3 kg)   SpO2 97%   BMI 25.57 kg/m   Opioid Risk Score:   Fall Risk Score:  `1  Depression screen PHQ 2/9  Depression screen [facility] 2/9 03/17/2018 10/18/2017 10/18/2017 09/30/2017 05/16/2016 08/24/2013  Decreased Interest 0 0 0 0 0 0  Down, Depressed, Hopeless 0 0 0 0 0 0  PHQ - 2 Score 0 0 0 0 0 0  Altered sleeping - 0 - - - -  Tired, decreased energy - 0 - - - -  Change in appetite - 0 - - - -  Feeling bad or failure about yourself  - 0 - - - -  Trouble concentrating - 0 - - - -  Moving slowly or fidgety/restless - 0 - - - -  Suicidal thoughts -  0 - - - -  PHQ-9 Score - 0 - - - -  Difficult doing work/chores - Not difficult at all - - - -   Review of Systems  Constitutional: Negative.   HENT: Negative.   Eyes: Negative.   Respiratory: Negative.   Cardiovascular: Negative.   Gastrointestinal: Negative.   Endocrine: Negative.   Genitourinary: Negative.   Musculoskeletal: Negative.   Skin: Negative.   Allergic/Immunologic: Negative.   Neurological: Negative.   Hematological: Negative.   Psychiatric/Behavioral: Negative.   All other systems reviewed and are negative.      Objective:   Physical Exam  Constitutional: Adrian Schneider appears well-developed and well-nourished.  HENT:  Head: Normocephalic and atraumatic.  Eyes: Pupils are equal, round, and reactive to light. EOM are normal.  Neurological: Adrian Schneider is alert.  MMT 5/5 HF, KE, 3- ADF  Tone MAS 2 in hamstrings  Bilateral MAS 3 in gastrosoleus  Nursing note and vitals reviewed.   No pain to palp over hamstrings but has pulling sensation in lateral hamstrings with stretch       Assessment & Plan:  1. 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 Right lateral Hamstring tendinopathy- may slowly resume HEP.  Resume 1 exercise at a time

## 2018-03-17 NOTE — Telephone Encounter (Signed)
Dr Letta Pate asked to find out if [employer] Botox has a discount --pharma rep. states with some diagnosis you can get from a $700-$1000 discount benefit.   e must go to their website and do the research and request himself - his diagnosis is G80.1   ICD 10 code,  Spastic diplegic cerebral palsy. We are unsure if this will qualify.    Left message at his cell phone voicemail to call.

## 2018-03-18 ENCOUNTER — Encounter: Payer: Self-pay | Admitting: Physical Medicine & Rehabilitation

## 2018-03-20 NOTE — Telephone Encounter (Signed)
Contacted patient and shared that Botox ([employer]erg[employer]) has a patient assistance program.  I gave him the info for [email or web address].  On the web page there is an application to download and phone numbers for assistance.

## 2018-03-31 ENCOUNTER — Ambulatory Visit (INDEPENDENT_AMBULATORY_CARE_PROVIDER_SITE_OTHER): Payer: BLUE CROSS/BLUE SHIELD | Admitting: Internal Medicine

## 2018-03-31 ENCOUNTER — Encounter: Payer: Self-pay | Admitting: Internal Medicine

## 2018-03-31 VITALS — BP 118/66 | HR 70 | Temp 97.6°F | Resp 14 | Ht 68.0 in | Wt 165.2 lb

## 2018-03-31 DIAGNOSIS — R739 Hyperglycemia, unspecified: Secondary | ICD-10-CM

## 2018-03-31 DIAGNOSIS — I10 Essential (primary) hypertension: Secondary | ICD-10-CM | POA: Diagnosis not present

## 2018-03-31 DIAGNOSIS — E785 Hyperlipidemia, unspecified: Secondary | ICD-10-CM | POA: Diagnosis not present

## 2018-03-31 DIAGNOSIS — G801 Spastic diplegic cerebral palsy: Secondary | ICD-10-CM

## 2018-03-31 LAB — LIPID PANEL
CHOL/HDL RATIO: 3
Cholesterol: 139 mg/dL (ref 0–200)
HDL: 48.8 mg/dL (ref >39.00)
LDL Cholesterol: 76 mg/dL (ref 0–99)
NonHDL: 89.77
TRIGLYCERIDES: 71 mg/dL (ref 0.0–149.0)
VLDL: 14.2 mg/dL (ref 0.0–40.0)

## 2018-03-31 LAB — BASIC METABOLIC PANEL
BUN: 13 mg/dL (ref 6–23)
CHLORIDE: 103 meq/L (ref 96–112)
CO2: 29 meq/L (ref 19–32)
CREATININE: 0.88 mg/dL (ref 0.40–1.50)
Calcium: 9.3 mg/dL (ref 8.4–10.5)
GFR: 95.83 mL/min (ref >60.00)
Glucose, Bld: 96 mg/dL (ref 70–99)
POTASSIUM: 4.4 meq/L (ref 3.5–5.1)
SODIUM: 137 meq/L (ref 135–145)

## 2018-03-31 LAB — HEMOGLOBIN A1C: Hgb A1c MFr Bld: 5.8 % (ref 4.6–6.5)

## 2018-03-31 NOTE — Patient Instructions (Signed)
GO TO THE LAB : Get the blood work     GO TO THE FRONT DESK Schedule your next appointment for a  physical exam in 6 months  

## 2018-03-31 NOTE — Assessment & Plan Note (Signed)
Prediabetes: Doing great with diet and exercise, check A1c HTN: Well-controlled on chart and, check a BMP Hyperlipidemia: On simvastatin, has lost weight, would like to adjust dose of simvastatin if possible.  Check FLP, further advise with results. CP: Working with rehabilitation medicine.  Currently having problems at the right hamstring RTC 6 months, CPX

## 2018-03-31 NOTE — Progress Notes (Signed)
Pre visit review using our clinic review tool, if applicable. No additional management support is needed unless otherwise documented below in the visit note. 

## 2018-03-31 NOTE — Progress Notes (Signed)
Subjective:    Patient ID: Adrian Schneider, male    DOB: 07/06/1966, 52 y.o.   MRN: [id number]  DOS:  03/31/2018 Type of visit - description : rov Interval history: CP: Since the last visit, he is working with Dr. Letta Pate, doing great, more active although lately has developed pain at the right hamstring HTN: Good compliance with medication, ambulatory BPs very good High cholesterol: Doing great with lifestyle, has lost weight, decrease simvastatin?.  Wt Readings from Last 3 Encounters:  03/31/18 165 lb 4 oz (75 kg)  03/17/18 168 lb 3.2 oz (76.3 kg)  09/30/17 179 lb (81.2 kg)     Review of Systems   Past Medical History:  Diagnosis Date  . Cerebral palsy ([place])    dx at birth, has poor balance  . Hemorrhoids   . Hyperlipidemia 6/11   started meds  . Hypertension 12/2009  . Left ankle pain    MRI in 06/2015 did not show specific internal derangement, had mild degenerative chondral thinning in the tibiotalar joint w/o a focal lesion  . MVP (mitral valve prolapse)    dx years ago  . Wart    L foot, non- healing lesion, f/u by derm    Past Surgical History:  Procedure Laterality Date  . LEG SURGERY Bilateral    release muscles d/t "cerebral palsy"    Social History   Socioeconomic History  . Marital status: Single    Spouse name: Not on file  . Number of children: 0  . Years of education: Not on file  . Highest education level: Not on file  Occupational History  . 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 Financial resource strain: Not on file  . Food insecurity:    Worry: Not on file    Inability: Not on file  . Transportation needs:    Medical: Not on file    Non-medical: Not on file  Tobacco Use  . Smoking status: Never Smoker  . Smokeless tobacco: Never Used  Substance and Sexual Activity  . Alcohol use: Yes    Alcohol/week: 0.0 oz    Comment: socially; weekly 1-2 beers weekly  . Drug use: No  . Sexual activity: Not  on file  Lifestyle  . Physical activity:    Days per week: Not on file    Minutes per session: Not on file  . Stress: Not on file  Relationships  . Social connections:    Talks on phone: Not on file    Gets together: Not on file    Attends religious service: Not on file    Active member of club or organization: Not on file    Attends meetings of clubs or organizations: Not on file    Relationship status: Not on file  . Intimate partner violence:    Fear of current or ex partner: Not on file    Emotionally abused: Not on file    Physically abused: Not on file    Forced sexual activity: Not on file  Other Topics Concern  . Not on file  Social History Narrative   Single, no children, lives by himself      Allergies as of 03/31/2018      Reactions   Dust Mite Extract    Mold Extract [trichophyton] Cough      Medication List        Accurate as of 03/31/18  8:45 PM. Always  use your most recent med list.          baclofen 10 MG tablet Commonly known as:  LIORESAL TAKE 1/2 TABLET(5 MG) BY MOUTH THREE TIMES DAILY   losartan 50 MG tablet Commonly known as:  COZAAR Take 1 tablet (50 mg total) by mouth daily.   simvastatin 40 MG tablet Commonly known as:  ZOCOR Take 1 tablet (40 mg total) by mouth daily.          Objective:   Physical Exam BP 118/66 (BP Location: Left Arm, Patient Position: Sitting, Cuff Size: Small)   Pulse 70   Temp 97.6 F (36.4 C) (Oral)   Resp 14   Ht 5\' 8"  (1.727 m)   Wt 165 lb 4 oz (75 kg)   SpO2 97%   BMI 25.13 kg/m  General:   Well developed, well nourished . NAD.  HEENT:  Normocephalic . Face symmetric, atraumatic Lungs:  CTA B Normal respiratory effort, no intercostal retractions, no accessory muscle use. Heart: RRR,  no murmur.  No pretibial edema bilaterally  Skin: Not pale. Not jaundice Neurologic:  alert & oriented X3.  Speech normal, gait unassisted, at baseline, consistent with CP Psych--  Cognition and judgment  appear intact.  Cooperative with normal attention span and concentration.  Behavior appropriate. No anxious or depressed appearing.      Assessment & Plan:   Assessment  Prediabetes HTN Hyperlipidemia, 2011 Cerebral palsy,   poor balance DJD - sees [facility]  ortho prn  Sees dermatology q year, h/o groin eczema  Tinnitus ~ 11-2016, saw Dr. Cresenciano Lick  PLAN: Prediabetes: Doing great with diet and exercise, check A1c HTN: Well-controlled on chart and, check a BMP Hyperlipidemia: On simvastatin, has lost weight, would like to adjust dose of simvastatin if possible.  Check FLP, further advise with results. CP: Working with rehabilitation medicine.  Currently having problems at the right hamstring RTC 6 months, CPX

## 2018-04-03 ENCOUNTER — Encounter: Payer: Self-pay | Admitting: Internal Medicine

## 2018-04-05 ENCOUNTER — Other Ambulatory Visit: Payer: Self-pay | Admitting: Physical Medicine & Rehabilitation

## 2018-04-07 MED ORDER — SILDENAFIL CITRATE 20 MG PO TABS: 60.0000 mg | ORAL_TABLET | Freq: Every day | ORAL | 0 refills | Status: DC | PRN

## 2018-04-07 NOTE — Telephone Encounter (Signed)
See message  Received: Today  Message Contents  Colon Branch, MD  Damita [place], [place]        Send prescription:  Sildenafil 20 mg: 3 or 4 tablets daily as needed #30, no refills.

## 2018-04-09 ENCOUNTER — Other Ambulatory Visit: Payer: Self-pay | Admitting: Internal Medicine

## 2018-04-16 ENCOUNTER — Other Ambulatory Visit: Payer: Self-pay | Admitting: Internal Medicine

## 2018-05-01 ENCOUNTER — Encounter: Payer: BLUE CROSS/BLUE SHIELD | Attending: Physical Medicine & Rehabilitation

## 2018-05-01 ENCOUNTER — Ambulatory Visit (HOSPITAL_BASED_OUTPATIENT_CLINIC_OR_DEPARTMENT_OTHER): Payer: BLUE CROSS/BLUE SHIELD | Admitting: Physical Medicine & Rehabilitation

## 2018-05-01 ENCOUNTER — Encounter: Payer: Self-pay | Admitting: Physical Medicine & Rehabilitation

## 2018-05-01 VITALS — BP 120/82 | HR 70 | Ht 68.0 in | Wt 162.0 lb

## 2018-05-01 DIAGNOSIS — G801 Spastic diplegic cerebral palsy: Secondary | ICD-10-CM

## 2018-05-01 NOTE — Progress Notes (Signed)
Botox Injection for spasticity using needle EMG guidance  Dilution: 50 Units/ml Indication: Severe spasticity which interferes with ADL,mobility and/or  hygiene and is unresponsive to medication management and other conservative care Informed consent was obtained after describing risks and benefits of the procedure with the patient. This includes bleeding, bruising, infection, excessive weakness, or medication side effects. A REMS form is on file and signed. Needle: 25g 2" needle electrode Number of units per muscle  GastrosoleusRight 100, Left 100 Right Hamstrings200 ( 150U to medial and 50 U to lateral ) All injections were done after obtaining appropriate EMG activity and after negative drawback for blood. The patient tolerated the procedure well. Post procedure instructions were given. A followup appointment was made.   Resume gluteus strengthening in 1 week, may do stationary upright bicycling, advise against hamstring stretching for the next 6 weeks

## 2018-05-01 NOTE — Patient Instructions (Signed)

## 2018-05-05 ENCOUNTER — Other Ambulatory Visit: Payer: Self-pay | Admitting: Internal Medicine

## 2018-05-06 ENCOUNTER — Other Ambulatory Visit: Payer: Self-pay | Admitting: Internal Medicine

## 2018-06-10 ENCOUNTER — Encounter: Payer: Self-pay | Admitting: Physical Medicine & Rehabilitation

## 2018-06-10 ENCOUNTER — Encounter: Payer: BLUE CROSS/BLUE SHIELD | Attending: Physical Medicine & Rehabilitation

## 2018-06-10 ENCOUNTER — Ambulatory Visit (HOSPITAL_BASED_OUTPATIENT_CLINIC_OR_DEPARTMENT_OTHER): Payer: BLUE CROSS/BLUE SHIELD | Admitting: Physical Medicine & Rehabilitation

## 2018-06-10 VITALS — BP 119/82 | HR 63 | Ht 68.0 in | Wt 162.0 lb

## 2018-06-10 DIAGNOSIS — G801 Spastic diplegic cerebral palsy: Secondary | ICD-10-CM | POA: Insufficient documentation

## 2018-06-10 NOTE — Progress Notes (Signed)
Subjective:    Patient ID: Adrian Schneider, male    DOB: 1966-04-30, 52 y.o.   MRN: [id number]  HPI   52 year old male with history of cerebral palsy, spastic diplegia pattern who underwent botulinum toxin injection with Botox on 05/01/2018. Patient had no postinjection complications.  He had the following dosing GastrosoleusRight 100, Left 100 Right Hamstrings200 ( 150U to medial and 50 U to lateral )  Has a history of right hamstring strain and has not returned to his previous exercise regimen.  He has been active however works full-time  Did well traveled to [place] , did a lot of walking. Pain Inventory Average Pain 1 Pain Right Now 1 My pain is aching  In the last 24 hours, has pain interfered with the following? General activity 1 Relation with others 1 Enjoyment of life 1 What TIME of day is your pain at its worst? na Sleep (in general) Good  Pain is worse with: na Pain improves with: medication and injections Relief from Meds: na  Mobility walk without assistance  Function employed # of hrs/week 40  Neuro/Psych No problems in this area  Prior Studies Any changes since last visit?  no  Physicians involved in your care Any changes since last visit?  no   Family History  Problem Relation Age of Onset  . Coronary artery disease Mother   . Hypertension Mother   . Coronary artery disease Maternal Grandmother   . Hypertension Father   . Diabetes Father   . Stroke Neg Hx   . Colon cancer Neg Hx   . Prostate cancer Neg Hx   . Esophageal cancer Neg Hx   . Stomach cancer Neg Hx   . Rectal cancer Neg Hx    Social History   Socioeconomic History  . Marital status: Single    Spouse name: Not on file  . Number of children: 0  . Years of education: Not on file  . Highest education level: Not on file  Occupational History  . Occupation: Chief Strategy Officer @ [employer]    Employer: [employer]  Social Needs  . Financial resource strain: Not on file  . Food  insecurity:    Worry: Not on file    Inability: Not on file  . Transportation needs:    Medical: Not on file    Non-medical: Not on file  Tobacco Use  . Smoking status: Never Smoker  . Smokeless tobacco: Never Used  Substance and Sexual Activity  . Alcohol use: Yes    Alcohol/week: 0.0 oz    Comment: socially; weekly 1-2 beers weekly  . Drug use: No  . Sexual activity: Not on file  Lifestyle  . Physical activity:    Days per week: Not on file    Minutes per session: Not on file  . Stress: Not on file  Relationships  . Social connections:    Talks on phone: Not on file    Gets together: Not on file    Attends religious service: Not on file    Active member of club or organization: Not on file    Attends meetings of clubs or organizations: Not on file    Relationship status: Not on file  Other Topics Concern  . Not on file  Social History Narrative   Single, no children, lives by himself   Past Surgical History:  Procedure Laterality Date  . LEG SURGERY Bilateral    release muscles d/t "cerebral palsy"   Past  Medical History:  Diagnosis Date  . Cerebral palsy ([place])    dx at birth, has poor balance  . Hemorrhoids   . Hyperlipidemia 6/11   started meds  . Hypertension 12/2009  . Left ankle pain    MRI in 06/2015 did not show specific internal derangement, had mild degenerative chondral thinning in the tibiotalar joint w/o a focal lesion  . MVP (mitral valve prolapse)    dx years ago  . Wart    L foot, non- healing lesion, f/u by derm   BP 119/82   Pulse 63   Ht 5\' 8"  (1.727 m)   Wt 162 lb (73.5 kg)   SpO2 97%   BMI 24.63 kg/m   Opioid Risk Score:   Fall Risk Score:  `1  Depression screen PHQ 2/9  Depression screen [facility] 2/9 03/17/2018 10/18/2017 10/18/2017 09/30/2017 05/16/2016 08/24/2013  Decreased Interest 0 0 0 0 0 0  Down, Depressed, Hopeless 0 0 0 0 0 0  PHQ - 2 Score 0 0 0 0 0 0  Altered sleeping - 0 - - - -  Tired, decreased energy - 0 - - - -    Change in appetite - 0 - - - -  Feeling bad or failure about yourself  - 0 - - - -  Trouble concentrating - 0 - - - -  Moving slowly or fidgety/restless - 0 - - - -  Suicidal thoughts - 0 - - - -  PHQ-9 Score - 0 - - - -  Difficult doing work/chores - Not difficult at all - - - -     Review of Systems  Constitutional: Negative.   HENT: Negative.   Eyes: Negative.   Respiratory: Negative.   Cardiovascular: Negative.   Gastrointestinal: Negative.   Endocrine:       High/low sugar   Genitourinary: Negative.   Musculoskeletal: Positive for gait problem.  Skin: Negative.   Allergic/Immunologic: Negative.   Hematological: Negative.   Psychiatric/Behavioral: Negative.        Objective:   Physical Exam  Constitutional: He is oriented to person, place, and time. He appears well-developed and well-nourished. No distress.  HENT:  Head: Normocephalic and atraumatic.  Eyes: Pupils are equal, round, and reactive to light.  Neurological: He is alert and oriented to person, place, and time.  MAS 2 at the right hamstring/knee flexors MAS 3 in the ankle dorsiflexors He can stand at foot flat.  Patient has mildly crouched gait No windswept pattern Motor strength is 4- right knee extensor 5-left knee extensor ankle dorsiflexor strength is 3- limited by range of motion.   Skin: He is not diaphoretic.  Nursing note and vitals reviewed.         Assessment & Plan:  1.  Cerebral palsy spastic diplegia overall improving with tone in the gastrosoleus muscle groups as well as right hamstrings.  I encouraged him to resume some aerobic exercise program such as stationary bicycling. He has a list of home exercise provided by physical therapy.  He has approximately 10 exercises on his list.  I have recommended that he resume these exercises 1 at a time such that he would do exercise 1 on day 1 exercises 1 and 2 on day 2 exercises 1-3 on day 3 and so on. Would recommend repeating same dose of  Botox in approximately 6 weeks

## 2018-07-24 ENCOUNTER — Other Ambulatory Visit: Payer: Self-pay

## 2018-07-24 ENCOUNTER — Encounter: Payer: BLUE CROSS/BLUE SHIELD | Attending: Physical Medicine & Rehabilitation

## 2018-07-24 ENCOUNTER — Encounter: Payer: Self-pay | Admitting: Physical Medicine & Rehabilitation

## 2018-07-24 ENCOUNTER — Ambulatory Visit (HOSPITAL_BASED_OUTPATIENT_CLINIC_OR_DEPARTMENT_OTHER): Payer: BLUE CROSS/BLUE SHIELD | Admitting: Physical Medicine & Rehabilitation

## 2018-07-24 VITALS — BP 129/70 | HR 64 | Ht 68.0 in | Wt 164.6 lb

## 2018-07-24 DIAGNOSIS — G801 Spastic diplegic cerebral palsy: Secondary | ICD-10-CM | POA: Insufficient documentation

## 2018-07-24 NOTE — Patient Instructions (Signed)

## 2018-07-24 NOTE — Progress Notes (Signed)
Botox Injection for spasticity using needle EMG guidance  Dilution: 50 Units/ml Indication: Severe spasticity which interferes with ADL,mobility and/or  hygiene and is unresponsive to medication management and other conservative care Informed consent was obtained after describing risks and benefits of the procedure with the patient. This includes bleeding, bruising, infection, excessive weakness, or medication side effects. A REMS form is on file and signed. Needle: 25g 2" needle electrode Number of units per muscle GastrosoleusRight 100, Left 100 Right Hamstrings200 ( 150U to medial and 50 U to lateral ) All injections were done after obtaining appropriate EMG activity and after negative drawback for blood. The patient tolerated the procedure well. Post procedure instructions were given. A followup appointment was made.  

## 2018-08-01 DIAGNOSIS — L570 Actinic keratosis: Secondary | ICD-10-CM | POA: Diagnosis not present

## 2018-08-01 DIAGNOSIS — L821 Other seborrheic keratosis: Secondary | ICD-10-CM | POA: Diagnosis not present

## 2018-08-01 DIAGNOSIS — D1801 Hemangioma of skin and subcutaneous tissue: Secondary | ICD-10-CM | POA: Diagnosis not present

## 2018-08-26 ENCOUNTER — Ambulatory Visit: Payer: BLUE CROSS/BLUE SHIELD | Admitting: Physical Medicine & Rehabilitation

## 2018-09-09 ENCOUNTER — Ambulatory Visit (HOSPITAL_BASED_OUTPATIENT_CLINIC_OR_DEPARTMENT_OTHER): Payer: BLUE CROSS/BLUE SHIELD | Admitting: Physical Medicine & Rehabilitation

## 2018-09-09 ENCOUNTER — Encounter: Payer: BLUE CROSS/BLUE SHIELD | Attending: Physical Medicine & Rehabilitation

## 2018-09-09 ENCOUNTER — Encounter: Payer: Self-pay | Admitting: Physical Medicine & Rehabilitation

## 2018-09-09 VITALS — BP 114/80 | HR 80 | Ht 68.0 in | Wt 166.0 lb

## 2018-09-09 DIAGNOSIS — G801 Spastic diplegic cerebral palsy: Secondary | ICD-10-CM | POA: Insufficient documentation

## 2018-09-09 NOTE — Patient Instructions (Signed)
Hamstring Strain Rehab Ask your health care provider which exercises are safe for you. Do exercises exactly as told by your health care provider and adjust them as directed. It is normal to feel mild stretching, pulling, tightness, or discomfort as you do these exercises, but you should stop right away if you feel sudden pain or your pain gets worse.Do not begin these exercises until told by your health care provider. Strengthening exercises These exercises build strength and endurance in your thighs. Endurance is the ability to use your muscles for a long time, even after your muscles get tired. Exercise A: Straight leg raises ( hip extensors) 1. Lie on your belly on a firm surface. 2. Tense the muscles in your buttocks to lift your left / right leg about 4 inches (10 cm). Keep your knee straight. 3. If you cannot lift your leg this high without arching your back, place a pillow under your hips. 4. Hold the position for __________ seconds. 5. Slowly lower your leg to the starting position and allow it to relax completely before you start the next repetition. Repeat __________ times. Complete this exercise __________ times a day. Exercise B: Bridge ( hip extensors) 1. Lie on your back on a firm surface with your knees bent and your feet flat on the floor. 2. Tighten your buttocks muscles and lift your bottom off the floor until your trunk is level with your thighs. ? You should feel the muscles working in your buttocks and the back of your thighs. ? Do not arch your back. 3. Hold this position for __________ seconds. 4. Slowly lower your hips to the starting position. 5. Let your buttocks muscles relax completely between repetitions. Repeat __________ times. Complete this exercise __________ times a day. If told by your health care provider, keep your bottom lifted off the floor while you slowly walk your feet away from you as far as you can control. Hold for __________ seconds, then slowly  walk your feet back toward you. Exercise C: Lateral walking with band ( hip abductors) 1. Stand in a long hallway. 2. Wrap a loop of exercise band around your legs, just above your knees. 3. Bend your knees gently and drop your hips down and back so your weight is over your heels. 4. Step to the side to move down the length of the hallway, keeping your toes pointed ahead of you and keeping tension in the band. 5. Repeat, leading with your other leg. Repeat __________ times. Complete this exercise __________ times a day. Exercise D: Single leg stand with reaching ( eccentric hamstring) 1. Stand on your left / right foot. Keep your big toe down on the floor and try to keep your arch lifted. 2. Slowly reach down toward the floor as far as you can while keeping your balance. 3. Hold this position for __________ seconds. Repeat __________ times. Complete this exercise __________ times a day. Exercise E: Prone plank ( abdominals and core) 1. Lie on your belly on the floor and prop yourself up on your elbows. Your hands should be straight out in front of you, and your elbows should be below your shoulders. Position your feet so the balls of your feet touch the ground. The ball of your foot is on the walking surface, right under your toes. 2. Tighten your abdominal muscles and lift your body off the floor. ? Do not arch your back. ? Do not hold your breath. 3. Hold this position for __________ seconds. Repeat __________  times. Complete this exercise __________ times a day. This information is not intended to replace advice given to you by your health care provider. Make sure you discuss any questions you have with your health care provider. Document Released: 11/05/2005 Document Revised: 07/12/2016 Document Reviewed: 08/04/2015 Elsevier Interactive Patient Education  [employer].

## 2018-09-09 NOTE — Progress Notes (Signed)
Subjective:    Patient ID: Adrian Schneider, male    DOB: March 07, 1966, 52 y.o.   MRN: [id number]  HPI 9/6 Botox GastrosoleusRight 100, Left 100 Right Hamstrings200 ( 150U to medial and 50 U to lateral )  Pt feels Botox helpful with spasticity, still feels tight behind the R knee and both calves but has not been stretching  Pain Inventory Average Pain 0 Pain Right Now 0 My pain is na  In the last 24 hours, has pain interfered with the following? General activity 0 Relation with others 0 Enjoyment of life 0 What TIME of day is your pain at its worst? na Sleep (in general) Good  Pain is worse with: na Pain improves with: na Relief from Meds: 10  Mobility walk without assistance ability to climb steps?  yes do you drive?  yes  Function employed # of hrs/week 40  Neuro/Psych No problems in this area  Prior Studies Any changes since last visit?  no  Physicians involved in your care Any changes since last visit?  no   Family History  Problem Relation Age of Onset  . Coronary artery disease Mother   . Hypertension Mother   . Coronary artery disease Maternal Grandmother   . Hypertension Father   . Diabetes Father   . Stroke Neg Hx   . Colon cancer Neg Hx   . Prostate cancer Neg Hx   . Esophageal cancer Neg Hx   . Stomach cancer Neg Hx   . Rectal cancer Neg Hx    Social History   Socioeconomic History  . Marital status: Single    Spouse name: Not on file  . Number of children: 0  . Years of education: Not on file  . Highest education level: Not on file  Occupational History  . 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 Financial resource strain: Not on file  . Food insecurity:    Worry: Not on file    Inability: Not on file  . Transportation needs:    Medical: Not on file    Non-medical: Not on file  Tobacco Use  . Smoking status: Never Smoker  . Smokeless tobacco: Never Used  Substance and Sexual Activity  . Alcohol  use: Yes    Alcohol/week: 0.0 standard drinks    Comment: socially; weekly 1-2 beers weekly  . Drug use: No  . Sexual activity: Not on file  Lifestyle  . Physical activity:    Days per week: Not on file    Minutes per session: Not on file  . Stress: Not on file  Relationships  . Social connections:    Talks on phone: Not on file    Gets together: Not on file    Attends religious service: Not on file    Active member of club or organization: Not on file    Attends meetings of clubs or organizations: Not on file    Relationship status: Not on file  Other Topics Concern  . Not on file  Social History Narrative   Single, no children, lives by himself   Past Surgical History:  Procedure Laterality Date  . LEG SURGERY Bilateral    release muscles d/t "cerebral palsy"   Past Medical History:  Diagnosis Date  . Cerebral palsy ([place])    dx at birth, has poor balance  . Hemorrhoids   . Hyperlipidemia 6/11   started meds  . Hypertension  12/2009  . Left ankle pain    MRI in 06/2015 did not show specific internal derangement, had mild degenerative chondral thinning in the tibiotalar joint w/o a focal lesion  . MVP (mitral valve prolapse)    dx years ago  . Wart    L foot, non- healing lesion, f/u by derm   There were no vitals taken for this visit.  Opioid Risk Score:   Fall Risk Score:  `1  Depression screen PHQ 2/9  Depression screen [facility] 2/9 07/24/2018 03/17/2018 10/18/2017 10/18/2017 09/30/2017 05/16/2016 08/24/2013  Decreased Interest 0 0 0 0 0 0 0  Down, Depressed, Hopeless 0 0 0 0 0 0 0  PHQ - 2 Score 0 0 0 0 0 0 0  Altered sleeping - - 0 - - - -  Tired, decreased energy - - 0 - - - -  Change in appetite - - 0 - - - -  Feeling bad or failure about yourself  - - 0 - - - -  Trouble concentrating - - 0 - - - -  Moving slowly or fidgety/restless - - 0 - - - -  Suicidal thoughts - - 0 - - - -  PHQ-9 Score - - 0 - - - -  Difficult doing work/chores - - Not difficult at all -  - - -     Review of Systems  Constitutional: Negative.   HENT: Negative.   Eyes: Negative.   Respiratory: Negative.   Cardiovascular: Negative.   Gastrointestinal: Negative.   Endocrine: Negative.   Genitourinary: Negative.   Musculoskeletal: Negative.   Skin: Negative.   Allergic/Immunologic: Negative.   Neurological: Negative.   Hematological: Negative.   Psychiatric/Behavioral: Negative.   All other systems reviewed and are negative.      Objective:   Physical Exam  Constitutional: He is oriented to person, place, and time. He appears well-developed and well-nourished. No distress.  HENT:  Head: Normocephalic and atraumatic.  Musculoskeletal:  R knee flex contracture, lacks 10 dg  Neurological: He is alert and oriented to person, place, and time.  Skin: He is not diaphoretic.  Nursing note and vitals reviewed.  MAS 2 at bilaterl gastroc, MAS 3 in Right hamstring  Motor 4/5 bilateral HF, 5- Knee ext, 4- bilateral ankle DF  Amb crouched gait with poor heel strike     Assessment & Plan:  1.  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

## 2018-09-30 ENCOUNTER — Ambulatory Visit (INDEPENDENT_AMBULATORY_CARE_PROVIDER_SITE_OTHER): Payer: BLUE CROSS/BLUE SHIELD | Admitting: Medical

## 2018-09-30 ENCOUNTER — Other Ambulatory Visit: Payer: Self-pay | Admitting: Medical

## 2018-09-30 ENCOUNTER — Ambulatory Visit: Payer: Self-pay

## 2018-09-30 ENCOUNTER — Encounter: Payer: Self-pay | Admitting: Medical

## 2018-09-30 ENCOUNTER — Ambulatory Visit (HOSPITAL_BASED_OUTPATIENT_CLINIC_OR_DEPARTMENT_OTHER)
Admission: RE | Admit: 2018-09-30 | Discharge: 2018-09-30 | Disposition: A | Discharge status: Home / Self Care | Payer: BLUE CROSS/BLUE SHIELD | Source: Ambulatory Visit | Attending: Medical | Admitting: Medical

## 2018-09-30 VITALS — BP 131/81 | HR 80 | Temp 97.5°F | Resp 16 | Ht 68.0 in | Wt 170.0 lb

## 2018-09-30 DIAGNOSIS — M545 Low back pain, unspecified: Secondary | ICD-10-CM

## 2018-09-30 DIAGNOSIS — M4802 Spinal stenosis, cervical region: Secondary | ICD-10-CM | POA: Diagnosis not present

## 2018-09-30 MED ORDER — KETOROLAC TROMETHAMINE 60 MG/2ML IM SOLN
60.0000 mg | Freq: Once | INTRAMUSCULAR | Status: AC
  Administered 2018-09-30: 60 mg via INTRAMUSCULAR

## 2018-09-30 MED ORDER — DICLOFENAC SODIUM 75 MG PO TBEC: 75.0000 mg | DELAYED_RELEASE_TABLET | Freq: Two times a day (BID) | ORAL | 0 refills | Status: DC

## 2018-09-30 NOTE — Progress Notes (Signed)
Subjective:    Patient ID: Adrian Schneider, male    DOB: [birth year], 52 y.o.   MRN: [id number]  HPI Pt has history of cerebral palsy. He states today after massage he had rt side lower back spasms. Also some mild spasm/cramp to rt hamstring area.   Pt does get some massages in the past but new person who gave him the massage therapy pressed agressivley.  Pt has history of tendinopathy back of his rt leg.   He states hx of tight hamstrings.  Back pain in past before like this  He states today massage was chinese massage with using a lot of elbow pressure.  About 3 event of low back pain in past since 2011.    Review of Systems  Constitutional: Negative for diaphoresis, fatigue and fever.  Respiratory: Negative for cough, chest tightness, shortness of breath and wheezing.   Cardiovascular: Negative for chest pain and palpitations.  Gastrointestinal: Negative for abdominal pain.  Musculoskeletal: Positive for back pain.       Lower back spasma.  Hematological: Negative for adenopathy. Does not bruise/bleed easily.  Psychiatric/Behavioral: Negative for behavioral problems and confusion.    Past Medical History:  Diagnosis Date  . Cerebral palsy ([place])    dx at birth, has poor balance  . Hemorrhoids   . Hyperlipidemia 6/11   started meds  . Hypertension 12/2009  . Left ankle pain    MRI in 06/2015 did not show specific internal derangement, had mild degenerative chondral thinning in the tibiotalar joint w/o a focal lesion  . MVP (mitral valve prolapse)    dx years ago  . Wart    L foot, non- healing lesion, f/u by derm     Social History   Socioeconomic History  . Marital status: Single    Spouse name: Not on file  . Number of children: 0  . Years of education: Not on file  . Highest education level: Not on file  Occupational History  . 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 Financial resource strain: Not on file  . Food  insecurity:    Worry: Not on file    Inability: Not on file  . Transportation needs:    Medical: Not on file    Non-medical: Not on file  Tobacco Use  . Smoking status: Never Smoker  . Smokeless tobacco: Never Used  Substance and Sexual Activity  . Alcohol use: Yes    Alcohol/week: 0.0 standard drinks    Comment: socially; weekly 1-2 beers weekly  . Drug use: No  . Sexual activity: Not on file  Lifestyle  . Physical activity:    Days per week: Not on file    Minutes per session: Not on file  . Stress: Not on file  Relationships  . Social connections:    Talks on phone: Not on file    Gets together: Not on file    Attends religious service: Not on file    Active member of club or organization: Not on file    Attends meetings of clubs or organizations: Not on file    Relationship status: Not on file  . Intimate partner violence:    Fear of current or ex partner: Not on file    Emotionally abused: Not on file    Physically abused: Not on file    Forced sexual activity: Not on file  Other Topics Concern  . Not  on file  Social History Narrative   Single, no children, lives by himself    Past Surgical History:  Procedure Laterality Date  . LEG SURGERY Bilateral    release muscles d/t "cerebral palsy"    Family History  Problem Relation Age of Onset  . Coronary artery disease Mother   . Hypertension Mother   . Coronary artery disease Maternal Grandmother   . Hypertension Father   . Diabetes Father   . Stroke Neg Hx   . Colon cancer Neg Hx   . Prostate cancer Neg Hx   . Esophageal cancer Neg Hx   . Stomach cancer Neg Hx   . Rectal cancer Neg Hx     Allergies  Allergen Reactions  . Dust Mite Extract   . Mold Extract [Trichophyton] Cough    Current Outpatient Medications on File Prior to Visit  Medication Sig Dispense Refill  . baclofen (LIORESAL) 10 MG tablet TAKE 1/2 TABLET(5 MG) BY MOUTH THREE TIMES DAILY 45 tablet 2  . losartan (COZAAR) 50 MG tablet Take  1 tablet (50 mg total) by mouth daily. 90 tablet 2  . sildenafil (REVATIO) 20 MG tablet TAKE 3 TO 4 TABLETS BY MOUTH DAILY AS NEEDED 30 tablet 1  . simvastatin (ZOCOR) 40 MG tablet Take 1 tablet (40 mg total) by mouth daily. 90 tablet 2   No current facility-administered medications on file prior to visit.     BP 131/81   Pulse 80   Temp (!) 97.5 F (36.4 C) (Oral)   Resp 16   Ht 5\' 8"  (1.727 m)   Wt 170 lb (77.1 kg)   SpO2 98%   BMI 25.85 kg/m       Objective:   Physical Exam  General Appearance- Not in acute distress.    Chest and Lung Exam Auscultation: Breath sounds:-Normal. Clear even and unlabored. Adventitious sounds:- No Adventitious sounds.  Cardiovascular Auscultation:Rythm - Regular, rate and rythm. Heart Sounds -Normal heart sounds.  Abdomen Inspection:-Inspection Normal.  Palpation/Perucssion: Palpation and Percussion of the abdomen reveal- Non Tender, No Rebound tenderness, No rigidity(Guarding) and No Palpable abdominal masses.  Liver:-Normal.  Spleen:- Normal.   Back Mid lumbar spine tenderness to palpation. Mid and paraspinal tenderness Pain on straight leg lift. Pain on lateral movements and flexion/extension of the spine.  Lower ext neurologic  L5-S1 sensation intact bilaterally. Normal patellar reflexes bilaterally. No foot drop bilaterally.      Assessment & Plan:  You do have history of intermittent back pain with acute back pain today after aggressive massage.  I do think is a good idea to go ahead and get x-ray today of lumbar spine.  We gave you Toradol 60 mg IM injection and want you to continue baclofen but use 10 mg tablet at hour of sleep.  Starting tomorrow afternoon around this time you can begin diclofenac prescription.  Also recommend conservative back exercises printed.  If your back pain worsens or persist then consider referral to sports medicine.  Follow-up in 7 to 10 days or as needed.  Mackie Pai, PA-C

## 2018-09-30 NOTE — Telephone Encounter (Signed)
Thanks for update. They documented MS.

## 2018-09-30 NOTE — Telephone Encounter (Signed)
Will evaluate. Sounds like potential ED visit?

## 2018-09-30 NOTE — Telephone Encounter (Signed)
Pt has cerebral palsy.

## 2018-09-30 NOTE — Telephone Encounter (Signed)
Pt called to state that he has just had a massage and is now having back spasms that caused him to pull to the side of the road.  He states he has MS and his legs were feeling tight and stiff.  He has had massage before to loosen muscles. He thinks he just bent over wrong to put on his shoes and the spasms started. He takes baclofen for spasms as ordered. He took 1/2 tablet today before his massage. Per protocol appointment scheduled for today.  Care advice given to patient to include do not drive. Pt verbalized understanding of instructions.  Reason for Disposition . High-risk adult (e.g., history of cancer, HIV, or IV drug abuse)    MS pt  Answer Assessment - Initial Assessment Questions 1. ONSET: "When did the pain begin?"      20 minutes ago 2. LOCATION: "Where does it hurt?" (upper, mid or lower back)     Lower back 3. SEVERITY: "How bad is the pain?"  (e.g., Scale 1-10; mild, moderate, or severe)   - MILD (1-3): doesn't interfere with normal activities    - MODERATE (4-7): interferes with normal activities or awakens from sleep    - SEVERE (8-10): excruciating pain, unable to do any normal activities      7 4. PATTERN: "Is the pain constant?" (e.g., yes, no; constant, intermittent)      spasm 5. RADIATION: "Does the pain shoot into your legs or elsewhere?"     no 6. CAUSE:  "What do you think is causing the back pain?"      massage 7. BACK OVERUSE:  "Any recent lifting of heavy objects, strenuous work or exercise?"     massage 8. MEDICATIONS: "What have you taken so far for the pain?" (e.g., nothing, acetaminophen, NSAIDS)     baclophen today  9. NEUROLOGIC SYMPTOMS: "Do you have any weakness, numbness, or problems with bowel/bladder control?"     Moving leg causes pain 10. OTHER SYMPTOMS: "Do you have any other symptoms?" (e.g., fever, abdominal pain, burning with urination, blood in urine)       no 11. PREGNANCY: "Is there any chance you are pregnant?" (e.g., yes, no; LMP)  No  Protocols used: BACK PAIN-A-AH

## 2018-09-30 NOTE — Telephone Encounter (Signed)
FYI

## 2018-09-30 NOTE — Patient Instructions (Addendum)
You do have history of intermittent back pain with acute back pain today after aggressive massage.  I do think is a good idea to go ahead and get x-ray today of lumbar spine.  We gave you Toradol 60 mg IM injection and want you to continue baclofen but use 10 mg tablet at hour of sleep.  Starting tomorrow afternoon around this time you can begin diclofenac prescription.  Also recommend conservative back exercises printed.  If your back pain worsens or persist then consider referral to sports medicine.  Follow-up in 7 to 10 days or as needed.   Back Exercises If you have pain in your back, do these exercises 2-3 times each day or as told by your doctor. When the pain goes away, do the exercises once each day, but repeat the steps more times for each exercise (do more repetitions). If you do not have pain in your back, do these exercises once each day or as told by your doctor. Exercises Single Knee to Chest  Do these steps 3-5 times in a row for each leg: 1. Lie on your back on a firm bed or the floor with your legs stretched out. 2. Bring one knee to your chest. 3. Hold your knee to your chest by grabbing your knee or thigh. 4. Pull on your knee until you feel a gentle stretch in your lower back. 5. Keep doing the stretch for 10-30 seconds. 6. Slowly let go of your leg and straighten it.  Pelvic Tilt  Do these steps 5-10 times in a row: 1. Lie on your back on a firm bed or the floor with your legs stretched out. 2. Bend your knees so they point up to the ceiling. Your feet should be flat on the floor. 3. Tighten your lower belly (abdomen) muscles to press your lower back against the floor. This will make your tailbone point up to the ceiling instead of pointing down to your feet or the floor. 4. Stay in this position for 5-10 seconds while you gently tighten your muscles and breathe evenly.  Cat-Cow  Do these steps until your lower back bends more easily: 1. Get on your hands and knees  on a firm surface. Keep your hands under your shoulders, and keep your knees under your hips. You may put padding under your knees. 2. Let your head hang down, and make your tailbone point down to the floor so your lower back is round like the back of a cat. 3. Stay in this position for 5 seconds. 4. Slowly lift your head and make your tailbone point up to the ceiling so your back hangs low (sags) like the back of a cow. 5. Stay in this position for 5 seconds.  Press-Ups  Do these steps 5-10 times in a row: 1. Lie on your belly (face-down) on the floor. 2. Place your hands near your head, about shoulder-width apart. 3. While you keep your back relaxed and keep your hips on the floor, slowly straighten your arms to raise the top half of your body and lift your shoulders. Do not use your back muscles. To make yourself more comfortable, you may change where you place your hands. 4. Stay in this position for 5 seconds. 5. Slowly return to lying flat on the floor.  Bridges  Do these steps 10 times in a row: 1. Lie on your back on a firm surface. 2. Bend your knees so they point up to the ceiling. Your feet should  be flat on the floor. 3. Tighten your butt muscles and lift your butt off of the floor until your waist is almost as high as your knees. If you do not feel the muscles working in your butt and the back of your thighs, slide your feet 1-2 inches farther away from your butt. 4. Stay in this position for 3-5 seconds. 5. Slowly lower your butt to the floor, and let your butt muscles relax.  If this exercise is too easy, try doing it with your arms crossed over your chest. Belly Crunches  Do these steps 5-10 times in a row: 1. Lie on your back on a firm bed or the floor with your legs stretched out. 2. Bend your knees so they point up to the ceiling. Your feet should be flat on the floor. 3. Cross your arms over your chest. 4. Tip your chin a little bit toward your chest but do not bend  your neck. 5. Tighten your belly muscles and slowly raise your chest just enough to lift your shoulder blades a tiny bit off of the floor. 6. Slowly lower your chest and your head to the floor.  Back Lifts Do these steps 5-10 times in a row: 1. Lie on your belly (face-down) with your arms at your sides, and rest your forehead on the floor. 2. Tighten the muscles in your legs and your butt. 3. Slowly lift your chest off of the floor while you keep your hips on the floor. Keep the back of your head in line with the curve in your back. Look at the floor while you do this. 4. Stay in this position for 3-5 seconds. 5. Slowly lower your chest and your face to the floor.  Contact a doctor if:  Your back pain gets a lot worse when you do an exercise.  Your back pain does not lessen 2 hours after you exercise. If you have any of these problems, stop doing the exercises. Do not do them again unless your doctor says it is okay. Get help right away if:  You have sudden, very bad back pain. If this happens, stop doing the exercises. Do not do them again unless your doctor says it is okay. This information is not intended to replace advice given to you by your health care provider. Make sure you discuss any questions you have with your health care provider. Document Released: 12/08/2010 Document Revised: 04/12/2016 Document Reviewed: 12/30/2014 Elsevier Interactive Patient Education  [employer].

## 2018-10-08 ENCOUNTER — Other Ambulatory Visit: Payer: Self-pay | Admitting: Medical

## 2018-10-08 NOTE — Telephone Encounter (Signed)
Pt requesting refill for Diclofenac please advise.

## 2018-10-08 NOTE — Telephone Encounter (Signed)
Advise rx refill sent. If pain persists past this then let us know. Can refer to sports medicine.

## 2018-10-15 ENCOUNTER — Other Ambulatory Visit: Payer: Self-pay | Admitting: Medical

## 2018-10-21 ENCOUNTER — Ambulatory Visit (HOSPITAL_BASED_OUTPATIENT_CLINIC_OR_DEPARTMENT_OTHER): Payer: BLUE CROSS/BLUE SHIELD | Admitting: Physical Medicine & Rehabilitation

## 2018-10-21 ENCOUNTER — Encounter: Payer: Self-pay | Admitting: Physical Medicine & Rehabilitation

## 2018-10-21 ENCOUNTER — Encounter: Payer: BLUE CROSS/BLUE SHIELD | Attending: Physical Medicine & Rehabilitation

## 2018-10-21 VITALS — BP 130/87 | HR 86 | Resp 14 | Wt 167.0 lb

## 2018-10-21 DIAGNOSIS — G801 Spastic diplegic cerebral palsy: Secondary | ICD-10-CM | POA: Diagnosis not present

## 2018-10-21 NOTE — Patient Instructions (Signed)

## 2018-10-21 NOTE — Progress Notes (Signed)
Botox Injection for spasticity using needle EMG guidance  Dilution: 50 Units/ml Indication: Severe spasticity which interferes with ADL,mobility and/or  hygiene and is unresponsive to medication management and other conservative care Informed consent was obtained after describing risks and benefits of the procedure with the patient. This includes bleeding, bruising, infection, excessive weakness, or medication side effects. A REMS form is on file and signed. Needle: 25g 2" needle electrode Number of units per muscle  GastrosoleusRight 100, Left 100 Right Hamstrings200 ( 150U to medial and 50 U to lateral ) All injections were done after obtaining appropriate EMG activity and after negative drawback for blood.Based on EMG activity would increase medial hamstring  to 175 and left lateral gastroc 100UThe patient tolerated the procedure well. Post procedure instructions were given. A followup appointment was made.

## 2018-12-01 ENCOUNTER — Ambulatory Visit (HOSPITAL_BASED_OUTPATIENT_CLINIC_OR_DEPARTMENT_OTHER): Payer: Commercial Managed Care - PPO | Admitting: Physical Medicine & Rehabilitation

## 2018-12-01 ENCOUNTER — Encounter: Payer: Self-pay | Admitting: Physical Medicine & Rehabilitation

## 2018-12-01 ENCOUNTER — Encounter: Payer: Commercial Managed Care - PPO | Attending: Physical Medicine & Rehabilitation

## 2018-12-01 VITALS — BP 108/74 | HR 79 | Ht 68.0 in | Wt 172.0 lb

## 2018-12-01 DIAGNOSIS — G801 Spastic diplegic cerebral palsy: Secondary | ICD-10-CM | POA: Insufficient documentation

## 2018-12-01 NOTE — Progress Notes (Signed)
Subjective:    Patient ID: Adrian Schneider, male    DOB: May 04, 1966, 53 y.o.   MRN: [id number]  HPI 12/3 botox GastrosoleusRight 100, Left 100 Right Hamstrings200 ( 150U to medial and 50 U to lateral )  Had sharp shooting pain x 2 dorsum of RIght foot on 1/2and 1/4 Pain Inventory Average Pain 2 Pain Right Now 1 My pain is na  In the last 24 hours, has pain interfered with the following? General activity 3 Relation with others 2 Enjoyment of life 3 What TIME of day is your pain at its worst? na Sleep (in general) na  Pain is worse with: walking Pain improves with: na Relief from Meds: 6  Mobility walk without assistance  Function employed # of hrs/week 40  Neuro/Psych No problems in this area  Prior Studies Any changes since last visit?  no  Physicians involved in your care Any changes since last visit?  no   Family History  Problem Relation Age of Onset  . Coronary artery disease Mother   . Hypertension Mother   . Coronary artery disease Maternal Grandmother   . Hypertension Father   . Diabetes Father   . Stroke Neg Hx   . Colon cancer Neg Hx   . Prostate cancer Neg Hx   . Esophageal cancer Neg Hx   . Stomach cancer Neg Hx   . Rectal cancer Neg Hx    Social History   Socioeconomic History  . Marital status: Single    Spouse name: Not on file  . Number of children: 0  . Years of education: Not on file  . Highest education level: Not on file  Occupational History  . Occupation: Chief Strategy Officer @ [employer]    Employer: [employer]  Social Needs  . Financial resource strain: Not on file  . Food insecurity:    Worry: Not on file    Inability: Not on file  . Transportation needs:    Medical: Not on file    Non-medical: Not on file  Tobacco Use  . Smoking status: Never Smoker  . Smokeless tobacco: Never Used  Substance and Sexual Activity  . Alcohol use: Yes    Alcohol/week: 0.0 standard drinks    Comment: socially; weekly 1-2 beers weekly  . Drug  use: No  . Sexual activity: Not on file  Lifestyle  . Physical activity:    Days per week: Not on file    Minutes per session: Not on file  . Stress: Not on file  Relationships  . Social connections:    Talks on phone: Not on file    Gets together: Not on file    Attends religious service: Not on file    Active member of club or organization: Not on file    Attends meetings of clubs or organizations: Not on file    Relationship status: Not on file  Other Topics Concern  . Not on file  Social History Narrative   Single, no children, lives by himself   Past Surgical History:  Procedure Laterality Date  . LEG SURGERY Bilateral    release muscles d/t "cerebral palsy"   Past Medical History:  Diagnosis Date  . 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 Hemorrhoids   . Hyperlipidemia 6/11   started meds  . Hypertension 12/2009  . Left ankle pain    MRI in 06/2015 did not show specific internal derangement, had mild  degenerative chondral thinning in the tibiotalar joint w/o a focal lesion  . MVP (mitral valve prolapse)    dx years ago  . Wart    L foot, non- healing lesion, f/u by derm   BP 108/74   Pulse 79   Ht 5\' 8"  (1.727 m)   Wt 172 lb (78 kg)   SpO2 94%   BMI 26.15 kg/m   Opioid Risk Score:   Fall Risk Score:  `1  Depression screen PHQ 2/9  Depression screen [facility] 2/9 07/24/2018 03/17/2018 10/18/2017 10/18/2017 09/30/2017 05/16/2016 08/24/2013  Decreased Interest 0 0 0 0 0 0 0  Down, Depressed, Hopeless 0 0 0 0 0 0 0  PHQ - 2 Score 0 0 0 0 0 0 0  Altered sleeping - - 0 - - - -  Tired, decreased energy - - 0 - - - -  Change in appetite - - 0 - - - -  Feeling bad or failure about yourself  - - 0 - - - -  Trouble concentrating - - 0 - - - -  Moving slowly or fidgety/restless - - 0 - - - -  Suicidal thoughts - - 0 - - - -  PHQ-9 Score - - 0 - - - -  Difficult doing work/chores - - Not difficult at all - - - -     Review of Systems  Constitutional:  Negative.   HENT: Negative.   Eyes: Negative.   Respiratory: Negative.   Cardiovascular: Negative.   Gastrointestinal: Negative.   Endocrine: Negative.   Genitourinary: Negative.   Musculoskeletal: Positive for gait problem.  Allergic/Immunologic: Negative.   Hematological: Negative.   Psychiatric/Behavioral: Negative.        Objective:   Physical Exam Vitals signs and nursing note reviewed.  Constitutional:      Appearance: Normal appearance.  HENT:     Head: Normocephalic.     Nose: No congestion.     Mouth/Throat:     Mouth: Mucous membranes are moist.  Eyes:     Pupils: Pupils are equal, round, and reactive to light.  Neurological:     Mental Status: He is alert.    Tone MAS 1 in the right hamstrings MAS 1 at the ankle plantar flexors No evidence of clonus at the ankles Left ankle has no tenderness palpation over the medial malleolus or the anterior joint line or the lateral malleolus.  There is no ankle pain with inversion or eversion as well as with dorsiflexion plantarflexion. He ambulates with reduced heel strike although better than prior to injection. Motor strength is 4- bilateral hip flexors knee extensors 3- ankle dorsiflexors  No tenderness at the ischial tuberosity on the right side or in the hamstring area no pain with hamstring stretch negative straight leg raising     Assessment & Plan:  1.  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  He has chronic contractures at the ankle plantar flexors as well as increased tone at the right groin and the left knee flexors. He has had good results with his botulinum toxin injections and he will need repeat injections in 6 weeks. We discussed the importance of stretching and exercise.  He will be working on stationary bicycling.  He has gained about 9 pounds over the holidays.  2.  Right high hamstring tear symptomatically improved

## 2018-12-02 ENCOUNTER — Encounter: Payer: BLUE CROSS/BLUE SHIELD | Admitting: Internal Medicine

## 2019-01-06 ENCOUNTER — Encounter: Payer: Self-pay | Admitting: Internal Medicine

## 2019-01-06 ENCOUNTER — Ambulatory Visit (INDEPENDENT_AMBULATORY_CARE_PROVIDER_SITE_OTHER): Payer: Commercial Managed Care - PPO | Admitting: Internal Medicine

## 2019-01-06 VITALS — BP 122/70 | HR 91 | Temp 98.3°F | Resp 16 | Ht 68.0 in | Wt 169.2 lb

## 2019-01-06 DIAGNOSIS — Z0000 Encounter for general adult medical examination without abnormal findings: Secondary | ICD-10-CM

## 2019-01-06 DIAGNOSIS — G801 Spastic diplegic cerebral palsy: Secondary | ICD-10-CM | POA: Diagnosis not present

## 2019-01-06 LAB — TSH: TSH: 1.22 u[IU]/mL (ref 0.35–4.50)

## 2019-01-06 LAB — CBC WITH DIFFERENTIAL/PLATELET
BASOS PCT: 0.5 % (ref 0.0–3.0)
Basophils Absolute: 0 10*3/uL (ref 0.0–0.1)
EOS ABS: 0.1 10*3/uL (ref 0.0–0.7)
EOS PCT: 1.8 % (ref 0.0–5.0)
HEMATOCRIT: 44.8 % (ref 39.0–52.0)
HEMOGLOBIN: 15.2 g/dL (ref 13.0–17.0)
LYMPHS PCT: 32.7 % (ref 12.0–46.0)
Lymphs Abs: 1.6 10*3/uL (ref 0.7–4.0)
MCHC: 33.9 g/dL (ref 30.0–36.0)
MCV: 89.4 fl (ref 78.0–100.0)
MONOS PCT: 10 % (ref 3.0–12.0)
Monocytes Absolute: 0.5 10*3/uL (ref 0.1–1.0)
Neutro Abs: 2.6 10*3/uL (ref 1.4–7.7)
Neutrophils Relative %: 55 % (ref 43.0–77.0)
Platelets: 285 10*3/uL (ref 150.0–400.0)
RBC: 5.01 Mil/uL (ref 4.22–5.81)
RDW: 13.5 % (ref 11.5–15.5)
WBC: 4.8 10*3/uL (ref 4.0–10.5)

## 2019-01-06 LAB — COMPREHENSIVE METABOLIC PANEL
ALBUMIN: 4.3 g/dL (ref 3.5–5.2)
ALK PHOS: 51 U/L (ref 39–117)
ALT: 18 U/L (ref 0–53)
AST: 23 U/L (ref 0–37)
BILIRUBIN TOTAL: 0.7 mg/dL (ref 0.2–1.2)
BUN: 16 mg/dL (ref 6–23)
CO2: 25 mEq/L (ref 19–32)
CREATININE: 0.93 mg/dL (ref 0.40–1.50)
Calcium: 9.2 mg/dL (ref 8.4–10.5)
Chloride: 104 mEq/L (ref 96–112)
GFR: 84.35 mL/min (ref >60.00)
Glucose, Bld: 86 mg/dL (ref 70–99)
Potassium: 4.1 mEq/L (ref 3.5–5.1)
SODIUM: 139 meq/L (ref 135–145)
TOTAL PROTEIN: 6.9 g/dL (ref 6.0–8.3)

## 2019-01-06 LAB — LIPID PANEL
CHOLESTEROL: 147 mg/dL (ref 0–200)
HDL: 51 mg/dL (ref >39.00)
LDL CALC: 83 mg/dL (ref 0–99)
NonHDL: 96.11
Total CHOL/HDL Ratio: 3
Triglycerides: 64 mg/dL (ref 0.0–149.0)
VLDL: 12.8 mg/dL (ref 0.0–40.0)

## 2019-01-06 MED ORDER — SILDENAFIL CITRATE 20 MG PO TABS: ORAL_TABLET | ORAL | 3 refills | Status: DC

## 2019-01-06 NOTE — Patient Instructions (Addendum)
Happy belated birthday!  GO TO THE LAB : Get the blood work     GO TO THE FRONT DESK Schedule your next appointment  For a physical in 1 year

## 2019-01-06 NOTE — Progress Notes (Signed)
Pre visit review using our clinic review tool, if applicable. No additional management support is needed unless otherwise documented below in the visit note. 

## 2019-01-06 NOTE — Assessment & Plan Note (Signed)
Prediabetes: Last A1c satisfactory, has not been watching his diet lately,   Encouraged to do. Hyperlipidemia: On simvastatin, checking labs.  Diet has not been the best lately, if cholesterol is elevated will recheck in few months. HTN: Continue losartan. Cerebral palsy: Follow-up by Dr. Letta Pate ED: Sildenafil works, refill sent. RTC 1 year

## 2019-01-06 NOTE — Progress Notes (Signed)
Subjective:    Patient ID: Adrian Schneider, male    DOB: Mar 26, 1966, 53 y.o.   MRN: [id number]  DOS:  01/06/2019 Type of visit - description: CPX In general feeling well   Review of Systems Diet has not been the best lately. Has not been able to stretch and exercise as much as before  Other than above, a 14 point review of systems is negative    Past Medical History:  Diagnosis Date  . 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 Hemorrhoids   . Hyperlipidemia 6/11   started meds  . Hypertension 12/2009  . Left ankle pain    MRI in 06/2015 did not show specific internal derangement, had mild degenerative chondral thinning in the tibiotalar joint w/o a focal lesion  . MVP (mitral valve prolapse)    dx years ago  . Wart    L foot, non- healing lesion, f/u by derm    Past Surgical History:  Procedure Laterality Date  . LEG SURGERY Bilateral    release muscles d/t "cerebral palsy"    Social History   Socioeconomic History  . Marital status: Single    Spouse name: Not on file  . Number of children: 0  . Years of education: Not on file  . Highest education level: Not on file  Occupational History  . Occupation: Chief Strategy Officer @ [employer]    Employer: [employer]  Social Needs  . Financial resource strain: Not on file  . Food insecurity:    Worry: Not on file    Inability: Not on file  . Transportation needs:    Medical: Not on file    Non-medical: Not on file  Tobacco Use  . Smoking status: Never Smoker  . Smokeless tobacco: Never Used  Substance and Sexual Activity  . Alcohol use: Yes    Alcohol/week: 0.0 standard drinks    Comment: socially; weekly 1-2 beers weekly  . Drug use: No  . Sexual activity: Not on file  Lifestyle  . Physical activity:    Days per week: Not on file    Minutes per session: Not on file  . Stress: Not on file  Relationships  . Social connections:    Talks on phone: Not on file    Gets together: Not on file    Attends  religious service: Not on file    Active member of club or organization: Not on file    Attends meetings of clubs or organizations: Not on file    Relationship status: Not on file  . Intimate partner violence:    Fear of current or ex partner: Not on file    Emotionally abused: Not on file    Physically abused: Not on file    Forced sexual activity: Not on file  Other Topics Concern  . Not on file  Social History Narrative   Single, no children, lives by himself     Family History  Problem Relation Age of Onset  . Coronary artery disease Mother   . Hypertension Mother   . Skin cancer Mother   . Coronary artery disease Maternal Grandmother   . Hypertension Father   . Diabetes Father   . Heart failure Father   . Lung cancer Other        aunt, uncle , smokers   . Breast cancer Other        cousins  . Stroke Neg Hx   .  Colon cancer Neg Hx   . Prostate cancer Neg Hx   . Esophageal cancer Neg Hx   . Stomach cancer Neg Hx   . Rectal cancer Neg Hx      Allergies as of 01/06/2019      Reactions   Dust Mite Extract    Mold Extract [trichophyton] Cough      Medication List       Accurate as of January 06, 2019  5:57 PM. Always use your most recent med list.        baclofen 10 MG tablet Commonly known as:  LIORESAL TAKE 1/2 TABLET(5 MG) BY MOUTH THREE TIMES DAILY   losartan 50 MG tablet Commonly known as:  COZAAR Take 1 tablet (50 mg total) by mouth daily.   sildenafil 20 MG tablet Commonly known as:  REVATIO TAKE 3 TO 4 TABLETS BY MOUTH DAILY AS NEEDED   simvastatin 40 MG tablet Commonly known as:  ZOCOR Take 1 tablet (40 mg total) by mouth daily.           Objective:   Physical Exam BP 122/70 (BP Location: Right Arm, Patient Position: Sitting, Cuff Size: Small)   Pulse 91   Temp 98.3 F (36.8 C) (Oral)   Resp 16   Ht 5\' 8"  (1.727 m)   Wt 169 lb 4 oz (76.8 kg)   SpO2 98%   BMI 25.73 kg/m  General: Well developed, NAD, BMI noted Neck: No   thyromegaly  HEENT:  Normocephalic . Face symmetric, atraumatic Lungs:  CTA B Normal respiratory effort, no intercostal retractions, no accessory muscle use. Heart: RRR,  no murmur.  No pretibial edema bilaterally  Abdomen:  Not distended, soft, non-tender. No rebound or rigidity.   Skin: Exposed areas without rash. Not pale. Not jaundice Neurologic:  alert & oriented X3.  Speech normal, gait at baseline Strength symmetric and appropriate for age.  Psych: Cognition and judgment appear intact.  Cooperative with normal attention span and concentration.  Behavior appropriate. No anxious or depressed appearing.     Assessment     Assessment  Prediabetes HTN Hyperlipidemia, 2011 Cerebral palsy,   poor balance DJD - sees [facility] prn  Sees dermatology q year, h/o groin eczema E.D. Tinnitus ~ 11-2016, saw Dr. Cresenciano Lick  PLAN: Prediabetes: Last A1c satisfactory, has not been watching his diet lately,   Encouraged to do. Hyperlipidemia: On simvastatin, checking labs.  Diet has not been the best lately, if cholesterol is elevated will recheck in few months. HTN: Continue losartan. Cerebral palsy: Follow-up by Dr. Letta Pate ED: Sildenafil works, refill sent. RTC 1 year

## 2019-01-06 NOTE — Assessment & Plan Note (Signed)
-  Td 02-2015; s/p Shingrix x 2; s/p flu shot  -Prostate cancer screening, DRE  & PSA 09/2017 wnl -Colon cancer screening, Cscope 2016, next 5 years per GI letter  -Labs: CMP, FLP, CBC, TSH -diet, exercise: Counseling

## 2019-01-07 ENCOUNTER — Other Ambulatory Visit: Payer: Commercial Managed Care - PPO

## 2019-01-07 ENCOUNTER — Encounter: Payer: Self-pay | Admitting: Internal Medicine

## 2019-01-07 ENCOUNTER — Other Ambulatory Visit: Payer: Self-pay

## 2019-01-07 DIAGNOSIS — Z113 Encounter for screening for infections with a predominantly sexual mode of transmission: Secondary | ICD-10-CM

## 2019-01-08 LAB — RPR: RPR: NONREACTIVE

## 2019-01-08 LAB — HIV ANTIBODY (ROUTINE TESTING W REFLEX): HIV 1&2 Ab, 4th Generation: NONREACTIVE

## 2019-01-12 ENCOUNTER — Encounter: Payer: Self-pay | Admitting: Physical Medicine & Rehabilitation

## 2019-01-12 ENCOUNTER — Encounter: Payer: Commercial Managed Care - PPO | Attending: Physical Medicine & Rehabilitation

## 2019-01-12 ENCOUNTER — Ambulatory Visit: Payer: Commercial Managed Care - PPO | Admitting: Physical Medicine & Rehabilitation

## 2019-01-12 VITALS — BP 122/81 | HR 74 | Ht 68.0 in | Wt 171.0 lb

## 2019-01-12 DIAGNOSIS — G801 Spastic diplegic cerebral palsy: Secondary | ICD-10-CM | POA: Insufficient documentation

## 2019-01-12 NOTE — Progress Notes (Signed)
Botox Injection for spasticity using needle EMG guidance  Dilution: 50 Units/ml Indication: Severe spasticity which interferes with ADL,mobility and/or  hygiene and is unresponsive to medication management and other conservative care Informed consent was obtained after describing risks and benefits of the procedure with the patient. This includes bleeding, bruising, infection, excessive weakness, or medication side effects. A REMS form is on file and signed. Needle: 25g 2" needle electrode Number of units per muscle GastrosoleusRight 100, Left 100 Right Hamstrings200 ( 150U to medial and 50 U to lateral ) All injections were done after obtaining appropriate EMG activity and after negative drawback for blood. The patient tolerated the procedure well. Post procedure instructions were given. A followup appointment was made.  

## 2019-01-12 NOTE — Patient Instructions (Signed)

## 2019-01-16 ENCOUNTER — Telehealth: Payer: Self-pay

## 2019-01-16 NOTE — Telephone Encounter (Signed)
PA initiated via Covermymeds; KEY: [id number]. Awaiting determination.

## 2019-01-16 NOTE — Telephone Encounter (Signed)
PA denied. Medication is not a covered benefit of Pt's plan.

## 2019-01-31 ENCOUNTER — Other Ambulatory Visit: Payer: Self-pay | Admitting: Internal Medicine

## 2019-02-11 ENCOUNTER — Other Ambulatory Visit: Payer: Self-pay | Admitting: Physical Medicine & Rehabilitation

## 2019-02-23 ENCOUNTER — Ambulatory Visit: Payer: Commercial Managed Care - PPO | Admitting: Physical Medicine & Rehabilitation

## 2019-02-23 ENCOUNTER — Other Ambulatory Visit: Payer: Self-pay

## 2019-02-23 ENCOUNTER — Encounter: Payer: Commercial Managed Care - PPO | Attending: Physical Medicine & Rehabilitation

## 2019-02-23 ENCOUNTER — Encounter: Payer: Self-pay | Admitting: Physical Medicine & Rehabilitation

## 2019-02-23 VITALS — BP 108/68 | HR 80 | Resp 14 | Ht 68.0 in | Wt 168.0 lb

## 2019-02-23 DIAGNOSIS — G801 Spastic diplegic cerebral palsy: Secondary | ICD-10-CM

## 2019-02-23 NOTE — Progress Notes (Signed)
Subjective:    Patient ID: Adrian Schneider, male    DOB: 1966-06-17, 53 y.o.   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 General activity 0 Relation with others 0 Enjoyment of life 0 What TIME of day is your pain at its worst?  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  no  Physicians involved in your care Any changes since last visit?  no   Family History  Problem Relation Age of Onset  . Coronary artery disease Mother   . Hypertension Mother   . Skin cancer Mother   . Coronary artery disease Maternal Grandmother   . Hypertension Father   . Diabetes Father   . Heart failure Father   . Lung cancer Other        aunt, uncle , smokers   . Breast cancer Other        cousins  . Stroke Neg Hx   . Colon cancer Neg Hx   . Prostate cancer Neg Hx   . Esophageal cancer Neg Hx   . Stomach cancer Neg Hx   . Rectal cancer Neg Hx    Social History   Socioeconomic History  . Marital status: Single    Spouse name: Not on file  . Number of children: 0  . Years of education: Not on file  . Highest education level: Not on file  Occupational History  . Occupation: Chief Strategy Officer @ [employer]    Employer: [employer]  Social Needs  . Financial resource strain: Not on file  . Food insecurity:    Worry: Not on file    Inability: Not on file   . Transportation needs:    Medical: Not on file    Non-medical: Not on file  Tobacco Use  . Smoking status: Never Smoker  . Smokeless tobacco: Never Used  Substance and Sexual Activity  . Alcohol use: Yes    Alcohol/week: 0.0 standard drinks    Comment: socially; weekly 1-2 beers weekly  . Drug use: No  . Sexual activity: Not on file  Lifestyle  . Physical activity:    Days per week: Not on file    Minutes per session: Not on file  . Stress: Not on file  Relationships  . Social connections:    Talks on phone: Not on file    Gets together: Not on file    Attends religious service: Not on file    Active member of club or organization: Not on file    Attends meetings of clubs or organizations: Not on file    Relationship status: Not on file  Other Topics Concern  . Not on file  Social History Narrative   Single, no children, lives by himself   Past Surgical History:  Procedure Laterality Date  . LEG SURGERY Bilateral    release muscles d/t "cerebral palsy"   Past Medical History:  Diagnosis Date  . 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 Hemorrhoids   . Hyperlipidemia 6/11   started meds  . Hypertension 12/2009  . Left ankle pain    MRI in 06/2015 did not show specific internal derangement, had mild degenerative chondral thinning in the tibiotalar joint w/o a focal lesion  . MVP (mitral valve prolapse)    dx years ago  . Wart    L foot, non- healing lesion, f/u by derm   BP 108/68   Pulse 80   Resp 14   Ht 5\' 8"  (1.727 m)   Wt 168 lb (76.2 kg)   SpO2 95%   BMI 25.54 kg/m   Opioid Risk Score:   Fall Risk Score:  `1  Depression screen PHQ 2/9  Depression screen [facility] 2/9 07/24/2018 03/17/2018 10/18/2017 10/18/2017 09/30/2017 05/16/2016 08/24/2013  Decreased Interest 0 0 0 0 0 0 0  Down, Depressed, Hopeless 0 0 0 0 0 0 0  PHQ - 2 Score 0 0 0 0 0 0 0  Altered sleeping - - 0 - - - -  Tired, decreased energy - - 0 - - - -  Change in appetite - - 0  - - - -  Feeling bad or failure about yourself  - - 0 - - - -  Trouble concentrating - - 0 - - - -  Moving slowly or fidgety/restless - - 0 - - - -  Suicidal thoughts - - 0 - - - -  PHQ-9 Score - - 0 - - - -  Difficult doing work/chores - - Not difficult at all - - - -    Review of Systems  Constitutional: Negative.   Eyes: Negative.   Respiratory: Negative.   Cardiovascular: Negative.   Gastrointestinal: Negative.   Endocrine: Negative.   Genitourinary: Negative.   Musculoskeletal: Negative.   Skin: Negative.   Allergic/Immunologic: Negative.   Neurological: Negative.   Hematological: Negative.   Psychiatric/Behavioral: Negative.   All other systems reviewed and are negative.      Objective:   Physical Exam  ROM R Kee -10deg L Knee - 20deg  R ankle - 25 Left ankle -32 deg  Tone MAS 2 at knee ext , MAS 4 at ankles  Motor  4/5 in B HF, KE, 3- B ankle PF and DF     Assessment & Plan:  1.  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 Compliance has been an issue and this has been discussed  Repeat in 6 wks

## 2019-04-06 ENCOUNTER — Encounter
Payer: Commercial Managed Care - PPO | Attending: Physical Medicine & Rehabilitation | Admitting: Physical Medicine & Rehabilitation

## 2019-04-06 ENCOUNTER — Other Ambulatory Visit: Payer: Self-pay

## 2019-04-06 ENCOUNTER — Encounter: Payer: Self-pay | Admitting: Physical Medicine & Rehabilitation

## 2019-04-06 VITALS — BP 121/82 | HR 80 | Temp 98.3°F | Ht 68.0 in | Wt 172.0 lb

## 2019-04-06 DIAGNOSIS — G801 Spastic diplegic cerebral palsy: Secondary | ICD-10-CM | POA: Diagnosis present

## 2019-04-06 NOTE — Progress Notes (Signed)
Botox Injection for spasticity using needle EMG guidance  Dilution: 50 Units/ml Indication: Severe spasticity which interferes with ADL,mobility and/or  hygiene and is unresponsive to medication management and other conservative care Informed consent was obtained after describing risks and benefits of the procedure with the patient. This includes bleeding, bruising, infection, excessive weakness, or medication side effects. A REMS form is on file and signed. Needle: 25g 2" needle electrode Number of units per muscle GastrosoleusRight 100, Left 100 Right Hamstrings200 ( 150U to medial and 50 U to lateral ) All injections were done after obtaining appropriate EMG activity and after negative drawback for blood. The patient tolerated the procedure well. Post procedure instructions were given. A followup appointment was made.

## 2019-05-08 ENCOUNTER — Other Ambulatory Visit: Payer: Self-pay | Admitting: Internal Medicine

## 2019-05-18 ENCOUNTER — Ambulatory Visit: Payer: Commercial Managed Care - PPO | Admitting: Physical Medicine & Rehabilitation

## 2019-05-25 ENCOUNTER — Ambulatory Visit: Payer: Commercial Managed Care - PPO | Admitting: Physical Medicine & Rehabilitation

## 2019-06-05 ENCOUNTER — Other Ambulatory Visit: Payer: Self-pay

## 2019-06-05 ENCOUNTER — Encounter: Payer: Self-pay | Admitting: Physical Medicine & Rehabilitation

## 2019-06-05 ENCOUNTER — Encounter
Payer: Commercial Managed Care - PPO | Attending: Physical Medicine & Rehabilitation | Admitting: Physical Medicine & Rehabilitation

## 2019-06-05 DIAGNOSIS — G801 Spastic diplegic cerebral palsy: Secondary | ICD-10-CM | POA: Diagnosis not present

## 2019-06-05 NOTE — Progress Notes (Signed)
Subjective:    Patient ID: Adrian Schneider, male    DOB: [birth year], 53 y.o.   MRN: [id number]  HPI  Has been extra busy moving out of his condominium.  Patient has not kept up with any stretching or strengthening exercises although he has been active.  Continues to work full-time. GastrosoleusRight 100, Left 100 Right Hamstrings200 ( 150U to medial and 50 U to lateral ) Pain Inventory Average Pain 0 Pain Right Now 0 My pain is na  In the last 24 hours, has pain interfered with the following? General activity 0 Relation with others 0 Enjoyment of life 0 What TIME of day is your pain at its worst? na Sleep (in general) Fair  Pain is worse with: na Pain improves with: na Relief from Meds: na  Mobility walk without assistance ability to climb steps?  yes do you drive?  yes  Function employed # of hrs/week 40  Neuro/Psych trouble walking  Prior Studies Any changes since last visit?  no  Physicians involved in your care Any changes since last visit?  no   Family History  Problem Relation Age of Onset  . Coronary artery disease Mother   . Hypertension Mother   . Skin cancer Mother   . Coronary artery disease Maternal Grandmother   . Hypertension Father   . Diabetes Father   . Heart failure Father   . Lung cancer Other        aunt, uncle , smokers   . Breast cancer Other        cousins  . Stroke Neg Hx   . Colon cancer Neg Hx   . Prostate cancer Neg Hx   . Esophageal cancer Neg Hx   . Stomach cancer Neg Hx   . Rectal cancer Neg Hx    Social History   Socioeconomic History  . Marital status: Single    Spouse name: Not on file  . Number of children: 0  . Years of education: Not on file  . Highest education level: Not on file  Occupational History  . Occupation: Chief Strategy Officer @ [employer]    Employer: [employer]  Social Needs  . Financial resource strain: Not on file  . Food insecurity    Worry: Not on file    Inability: Not on file  . Transportation  needs    Medical: Not on file    Non-medical: Not on file  Tobacco Use  . Smoking status: Never Smoker  . Smokeless tobacco: Never Used  Substance and Sexual Activity  . Alcohol use: Yes    Alcohol/week: 0.0 standard drinks    Comment: socially; weekly 1-2 beers weekly  . Drug use: No  . Sexual activity: Not on file  Lifestyle  . Physical activity    Days per week: Not on file    Minutes per session: Not on file  . Stress: Not on file  Relationships  . Social Herbalist on phone: Not on file    Gets together: Not on file    Attends religious service: Not on file    Active member of club or organization: Not on file    Attends meetings of clubs or organizations: Not on file    Relationship status: Not on file  Other Topics Concern  . Not on file  Social History Narrative   Single, no children, lives by himself   Past Surgical History:  Procedure Laterality Date  . LEG SURGERY  Bilateral    release muscles d/t "cerebral palsy"   Past Medical History:  Diagnosis Date  . Cerebral palsy ([place])    dx at birth, has poor balance  . Hemorrhoids   . Hyperlipidemia 6/11   started meds  . Hypertension 12/2009  . Left ankle pain    MRI in 06/2015 did not show specific internal derangement, had mild degenerative chondral thinning in the tibiotalar joint w/o a focal lesion  . MVP (mitral valve prolapse)    dx years ago  . Wart    L foot, non- healing lesion, f/u by derm   BP 118/75   Pulse 86   Temp 97.7 F (36.5 C)   Ht 5\' 8"  (1.727 m)   Wt 175 lb (79.4 kg)   SpO2 97%   BMI 26.61 kg/m   Opioid Risk Score:   Fall Risk Score:  `1  Depression screen PHQ 2/9  Depression screen [facility] 2/9 07/24/2018 03/17/2018 10/18/2017 10/18/2017 09/30/2017 05/16/2016 08/24/2013  Decreased Interest 0 0 0 0 0 0 0  Down, Depressed, Hopeless 0 0 0 0 0 0 0  PHQ - 2 Score 0 0 0 0 0 0 0  Altered sleeping - - 0 - - - -  Tired, decreased energy - - 0 - - - -  Change in appetite - - 0 - - -  -  Feeling bad or failure about yourself  - - 0 - - - -  Trouble concentrating - - 0 - - - -  Moving slowly or fidgety/restless - - 0 - - - -  Suicidal thoughts - - 0 - - - -  PHQ-9 Score - - 0 - - - -  Difficult doing work/chores - - Not difficult at all - - - -     Review of Systems  Constitutional: Negative.   HENT: Negative.   Eyes: Negative.   Respiratory: Negative.   Cardiovascular: Negative.   Gastrointestinal: Negative.   Endocrine: Negative.   Genitourinary: Negative.   Musculoskeletal: Negative.   Skin: Negative.   Allergic/Immunologic: Negative.   Neurological: Negative.   Hematological: Negative.   Psychiatric/Behavioral: Negative.   All other systems reviewed and are negative.      Objective:   Physical Exam Vitals signs and nursing note reviewed.  Constitutional:      Appearance: Normal appearance.  HENT:     Head: Normocephalic and atraumatic.     Mouth/Throat:     Mouth: Mucous membranes are moist.  Neurological:     Mental Status: He is alert.   Motor strength is 4- bilateral hip flexors knee extensors and 3- bilateral ankle dorsiflexor Patient has decreased ankle dorsiflexion range of motion, also lacks approximately 10 to 15 degrees of extension at both knees. No pain to palpation in the calves. Ambulates with a crouched type gait mild knee valgus.  No evidence of knee instability but does have mild foot drag on the left side.        Assessment & Plan:  1.  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  Getting good relief with Botox injections but needs to be more diligent in terms of his home exercise program stretching his hamstrings as well as his heel cords. Repeat Botox in 6 weeks same dose same muscle group selection

## 2019-06-05 NOTE — Patient Instructions (Signed)
Please stretch

## 2019-06-23 IMAGING — DX DG RIBS 2V*L*
2 series · 2 of 2 positions shown · non-contrast
Comparison: No recent prior.

CLINICAL DATA: Fall.  Left rib pain.

EXAM:
LEFT RIBS - 2 VIEW

[rib ap]
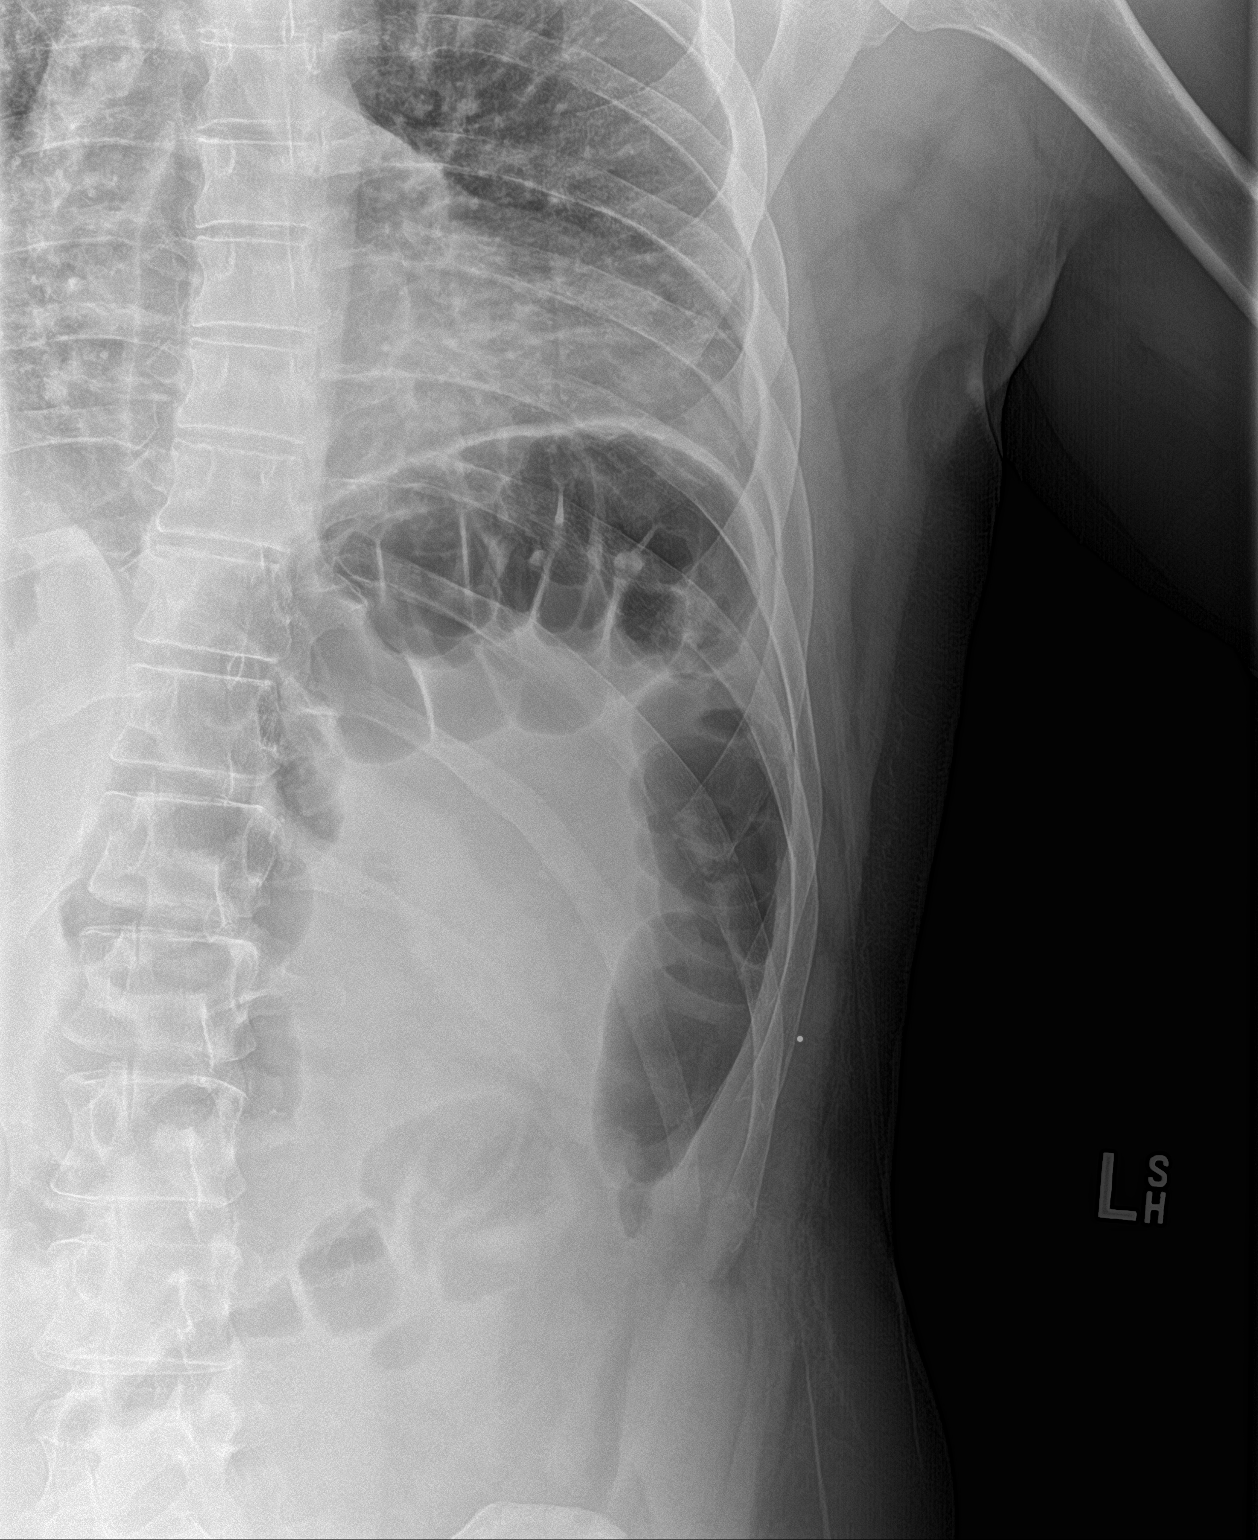

[rib ap obl]
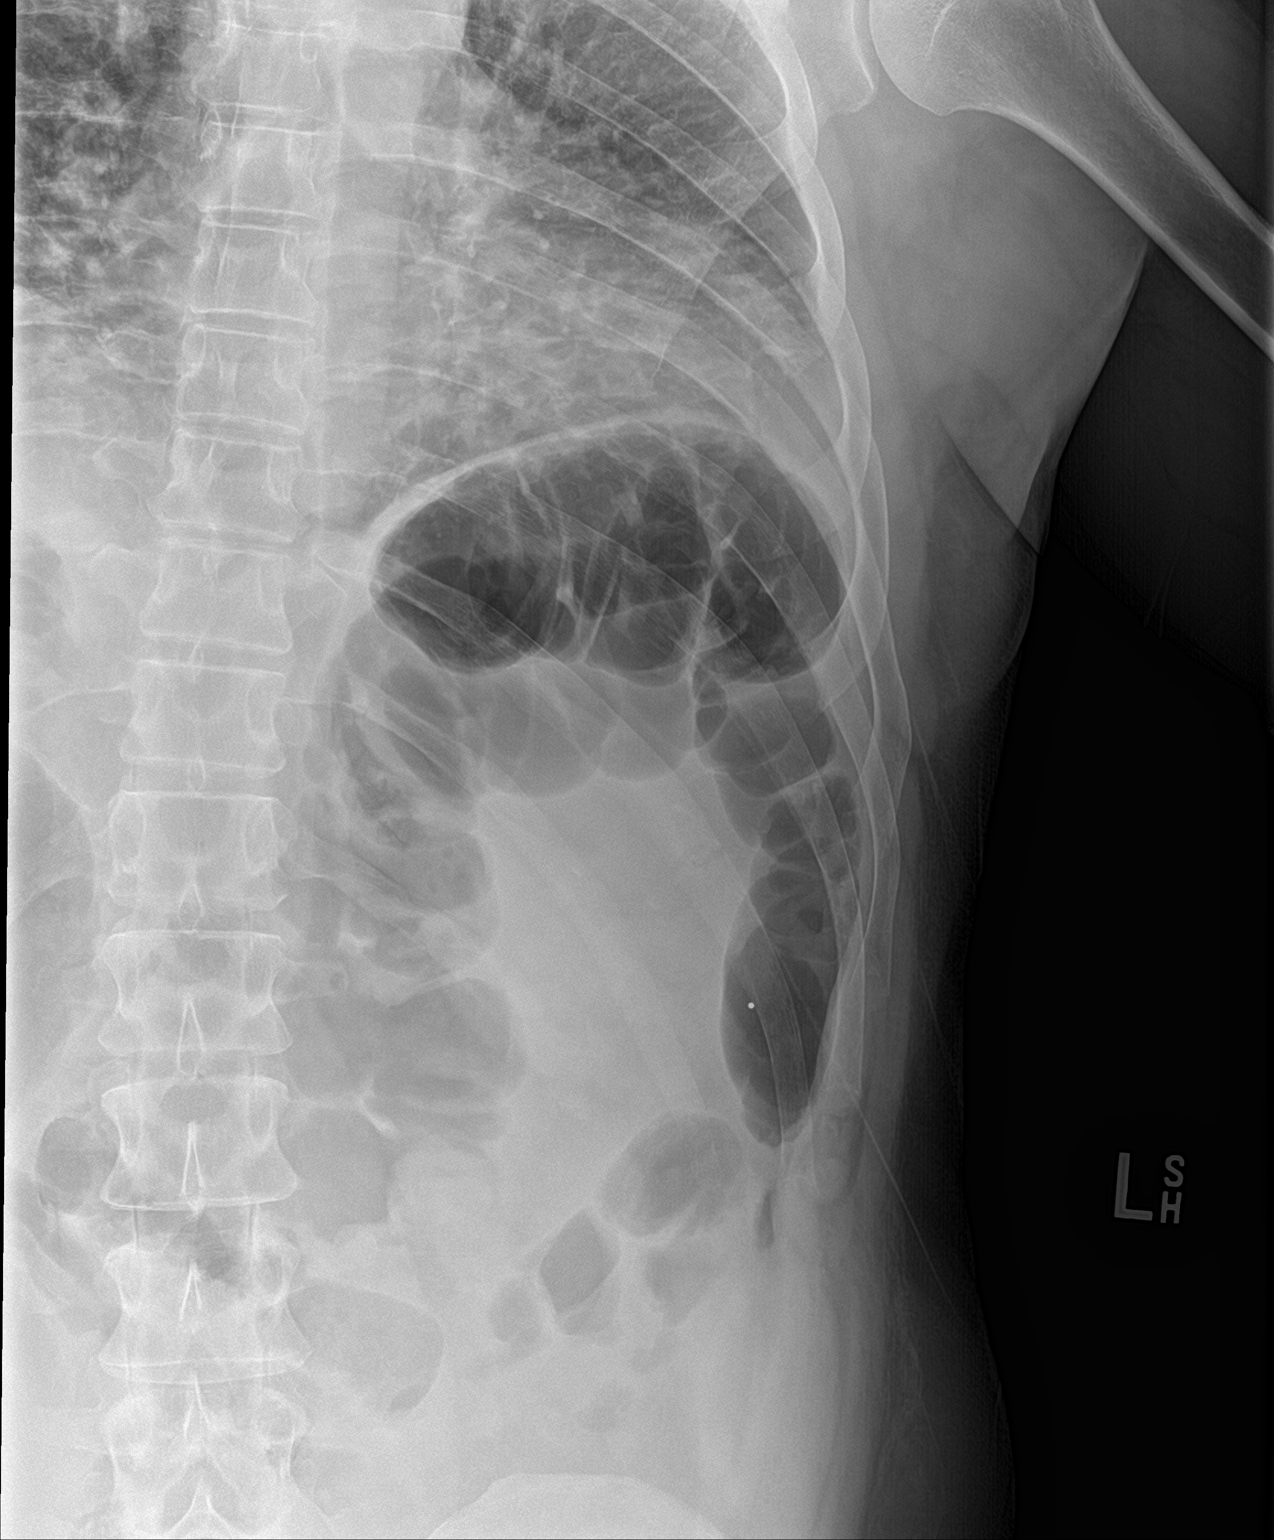

[2 of 2 positions shown; findings below may reference images not displayed]

FINDINGS: No acute bony abnormality identified. No evidence of fracture or
dislocation.
IMPRESSION: No acute or focal abnormality.

## 2019-07-15 ENCOUNTER — Encounter
Payer: Commercial Managed Care - PPO | Attending: Physical Medicine & Rehabilitation | Admitting: Physical Medicine & Rehabilitation

## 2019-07-15 ENCOUNTER — Other Ambulatory Visit: Payer: Self-pay

## 2019-07-15 ENCOUNTER — Encounter: Payer: Self-pay | Admitting: Physical Medicine & Rehabilitation

## 2019-07-15 VITALS — BP 119/80 | HR 75 | Temp 98.3°F | Ht 68.0 in | Wt 177.0 lb

## 2019-07-15 DIAGNOSIS — G801 Spastic diplegic cerebral palsy: Secondary | ICD-10-CM | POA: Insufficient documentation

## 2019-07-15 NOTE — Progress Notes (Signed)
Botox Injection for spasticity using needle EMG guidance  Dilution: 50 Units/ml Indication: Severe spasticity which interferes with ADL,mobility and/or  hygiene and is unresponsive to medication management and other conservative care Informed consent was obtained after describing risks and benefits of the procedure with the patient. This includes bleeding, bruising, infection, excessive weakness, or medication side effects. A REMS form is on file and signed. Needle: 25g 2" needle electrode Number of units per muscle GastrosoleusRight 100, Left 100 Right Hamstrings200 ( 150U to medial and 50 U to lateral ) All injections were done after obtaining appropriate EMG activity and after negative drawback for blood. The patient tolerated the procedure well. Post procedure instructions were given. A followup appointment was made.  

## 2019-07-15 NOTE — Patient Instructions (Signed)

## 2019-08-25 ENCOUNTER — Other Ambulatory Visit: Payer: Self-pay

## 2019-08-25 ENCOUNTER — Encounter
Payer: Commercial Managed Care - PPO | Attending: Physical Medicine & Rehabilitation | Admitting: Physical Medicine & Rehabilitation

## 2019-08-25 ENCOUNTER — Encounter: Payer: Self-pay | Admitting: Physical Medicine & Rehabilitation

## 2019-08-25 VITALS — BP 113/78 | HR 94 | Temp 97.5°F | Resp 16 | Ht 68.0 in | Wt 181.0 lb

## 2019-08-25 DIAGNOSIS — G801 Spastic diplegic cerebral palsy: Secondary | ICD-10-CM | POA: Diagnosis not present

## 2019-08-25 NOTE — Progress Notes (Signed)
Subjective:    Patient ID: Adrian Schneider, male    DOB: February 21, 1966, 53 y.o.   MRN: [id number]  HPI Moving out of apt, not going to gym  Has had shooting pains when he catches his food, no numbness or tingling   Male with history of spastic diplegia due to cerebral palsy.  He has chronic spasms in his hamstrings as well as his calf muscles. He had been doing well with outpatient PT but suffered a upper hamstring strain therefore he discontinued going. He used to be exercising regularly at the gym but no longer does so with the COVID restrictions. He occasionally drags his left toe and this causes a shooting sensation into the top of the foot.  He has not been keeping up with Achilles stretching exercises.  He has not done any strengthening exercises of the tibialis anterior muscle.   Pain Inventory Average Pain 2 Pain Right Now 2 My pain is sharp  In the last 24 hours, has pain interfered with the following? General activity 0 Relation with others 0 Enjoyment of life 0 What TIME of day is your pain at its worst? 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  no  Physicians involved in your care Any changes since last visit?  no   Family History  Problem Relation Age of Onset  . Coronary artery disease Mother   . Hypertension Mother   . Skin cancer Mother   . Coronary artery disease Maternal Grandmother   . Hypertension Father   . Diabetes Father   . Heart failure Father   . Lung cancer Other        aunt, uncle , smokers   . Breast cancer Other        cousins  . Stroke Neg Hx   . Colon cancer Neg Hx   . Prostate cancer Neg Hx   . Esophageal cancer Neg Hx   . Stomach cancer Neg Hx   . Rectal cancer Neg Hx    Social  History   Socioeconomic History  . Marital status: Single    Spouse name: Not on file  . Number of children: 0  . Years of education: Not on file  . Highest education level: Not on file  Occupational History  . Occupation: Chief Strategy Officer @ [employer]    Employer: [employer]  Social Needs  . Financial resource strain: Not on file  . Food insecurity    Worry: Not on file    Inability: Not on file  . Transportation needs    Medical: Not on file    Non-medical: Not on file  Tobacco Use  . Smoking status: Never Smoker  . Smokeless tobacco: Never Used  Substance and Sexual Activity  . Alcohol use: Yes    Alcohol/week: 0.0 standard drinks    Comment: socially; weekly 1-2 beers weekly  . Drug use: No  . Sexual activity: Not on file  Lifestyle  . Physical activity    Days per week: Not on file    Minutes per session: Not on file  . Stress: Not on file  Relationships  . Social Herbalist on phone: Not on file    Gets together:  Not on file    Attends religious service: Not on file    Active member of club or organization: Not on file    Attends meetings of clubs or organizations: Not on file    Relationship status: Not on file  Other Topics Concern  . Not on file  Social History Narrative   Single, no children, lives by himself   Past Surgical History:  Procedure Laterality Date  . LEG SURGERY Bilateral    release muscles d/t "cerebral palsy"   Past Medical History:  Diagnosis Date  . 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 Hemorrhoids   . Hyperlipidemia 6/11   started meds  . Hypertension 12/2009  . Left ankle pain    MRI in 06/2015 did not show specific internal derangement, had mild degenerative chondral thinning in the tibiotalar joint w/o a focal lesion  . MVP (mitral valve prolapse)    dx years ago  . Wart    L foot, non- healing lesion, f/u by derm   BP 113/78   Pulse 94   Temp (!) 97.5 F (36.4 C)   Resp 16   Ht 5\' 8"  (1.727 m)   Wt  181 lb (82.1 kg)   SpO2 94%   BMI 27.52 kg/m   Opioid Risk Score:   Fall Risk Score:  `1  Depression screen PHQ 2/9  Depression screen [facility] 2/9 07/24/2018 03/17/2018 10/18/2017 10/18/2017 09/30/2017 05/16/2016 08/24/2013  Decreased Interest 0 0 0 0 0 0 0  Down, Depressed, Hopeless 0 0 0 0 0 0 0  PHQ - 2 Score 0 0 0 0 0 0 0  Altered sleeping - - 0 - - - -  Tired, decreased energy - - 0 - - - -  Change in appetite - - 0 - - - -  Feeling bad or failure about yourself  - - 0 - - - -  Trouble concentrating - - 0 - - - -  Moving slowly or fidgety/restless - - 0 - - - -  Suicidal thoughts - - 0 - - - -  PHQ-9 Score - - 0 - - - -  Difficult doing work/chores - - Not difficult at all - - - -    Review of Systems  Constitutional: Negative.   HENT: Negative.   Eyes: Negative.   Respiratory: Negative.   Cardiovascular: Negative.   Gastrointestinal: Negative.   Endocrine: Negative.   Genitourinary: Negative.   Musculoskeletal: Positive for gait problem.  Skin: Negative.   Allergic/Immunologic: Negative.   Hematological: Negative.   Psychiatric/Behavioral: Negative.   All other systems reviewed and are negative.      Objective:   Physical Exam Vitals signs and nursing note reviewed.  Constitutional:      Appearance: Normal appearance.  Eyes:     Extraocular Movements: Extraocular movements intact.     Conjunctiva/sclera: Conjunctivae normal.     Pupils: Pupils are equal, round, and reactive to light.  Neurological:     Mental Status: He is alert and oriented to person, place, and time.  Psychiatric:        Mood and Affect: Mood normal.        Behavior: Behavior normal.        Thought Content: Thought content normal.        Judgment: Judgment normal.    Manual muscle testing 3+ bilateral TA 4- bilateral hamstring 4+ bilateral quads 4+ bilateral gastrosoleus  Gait has  a tendency towards toe drag on the left side.  Clears right foot well. He has a crouched type gait with  some valgus deformity left knee. Does not require assistive device. Mood and affect are appropriate Speech without dysarthria       Assessment & Plan:  1.  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  We discussed the importance of his home exercise program including hamstring as well as Achilles stretching as well as TA strengthening.  He would also do well with a overall lower extremity strengthening program as well as cardiovascular endurance program.  He is in the middle of a move and does not feel like he can do this right now but with think in a month he should be settled.  We discussed reinitiating physical therapy and he will think about this would probably like to try to go to the gym first. We will continue Botox injections we will continue current dosing and muscle group selection we did discuss Dysport but he does not wish to pursue a change at this time

## 2019-10-06 ENCOUNTER — Other Ambulatory Visit: Payer: Self-pay

## 2019-10-06 ENCOUNTER — Encounter
Payer: Commercial Managed Care - PPO | Attending: Physical Medicine & Rehabilitation | Admitting: Physical Medicine & Rehabilitation

## 2019-10-06 ENCOUNTER — Encounter: Payer: Self-pay | Admitting: Physical Medicine & Rehabilitation

## 2019-10-06 VITALS — BP 114/75 | HR 84 | Temp 97.8°F | Ht 68.0 in | Wt 187.0 lb

## 2019-10-06 DIAGNOSIS — G801 Spastic diplegic cerebral palsy: Secondary | ICD-10-CM | POA: Diagnosis not present

## 2019-10-06 NOTE — Progress Notes (Signed)
Botox Injection for spasticity using needle EMG guidance  Dilution: 50 Units/ml Indication: Severe spasticity which interferes with ADL,mobility and/or  hygiene and is unresponsive to medication management and other conservative care Informed consent was obtained after describing risks and benefits of the procedure with the patient. This includes bleeding, bruising, infection, excessive weakness, or medication side effects. A REMS form is on file and signed. Needle: 25g 2" needle electrode Number of units per muscle GastrosoleusRight 100, Left 100 Right Hamstrings200 ( 150U to medial and 50 U to lateral ) All injections were done after obtaining appropriate EMG activity and after negative drawback for blood. The patient tolerated the procedure well. Post procedure instructions were given. A followup appointment was made.  

## 2019-10-10 ENCOUNTER — Other Ambulatory Visit: Payer: Self-pay | Admitting: Physical Medicine & Rehabilitation

## 2019-10-13 ENCOUNTER — Other Ambulatory Visit: Payer: Self-pay

## 2019-10-13 DIAGNOSIS — Z20822 Contact with and (suspected) exposure to covid-19: Secondary | ICD-10-CM

## 2019-10-15 LAB — NOVEL CORONAVIRUS, NAA: SARS-CoV-2, NAA: NOT DETECTED

## 2019-11-09 ENCOUNTER — Other Ambulatory Visit: Payer: Self-pay | Admitting: Internal Medicine

## 2020-01-05 ENCOUNTER — Encounter
Payer: Commercial Managed Care - PPO | Attending: Physical Medicine & Rehabilitation | Admitting: Physical Medicine & Rehabilitation

## 2020-01-05 ENCOUNTER — Encounter: Payer: Self-pay | Admitting: Physical Medicine & Rehabilitation

## 2020-01-05 ENCOUNTER — Other Ambulatory Visit: Payer: Self-pay

## 2020-01-05 VITALS — BP 112/76 | HR 81 | Temp 97.8°F | Ht 68.0 in | Wt 182.0 lb

## 2020-01-05 DIAGNOSIS — G801 Spastic diplegic cerebral palsy: Secondary | ICD-10-CM | POA: Diagnosis not present

## 2020-01-05 NOTE — Progress Notes (Signed)
Botox Injection for spasticity using needle EMG guidance  Dilution: 50 Units/ml Indication: Severe spasticity which interferes with ADL,mobility and/or  hygiene and is unresponsive to medication management and other conservative care Informed consent was obtained after describing risks and benefits of the procedure with the patient. This includes bleeding, bruising, infection, excessive weakness, or medication side effects. A REMS form is on file and signed. Needle: 25g 2" needle electrode Number of units per muscle GastrosoleusRight 100, Left 100 Right Hamstrings200 ( 150U to medial and 50 U to lateral ) All injections were done after obtaining appropriate EMG activity and after negative drawback for blood. The patient tolerated the procedure well. Post procedure instructions were given. A followup appointment was made.

## 2020-01-05 NOTE — Patient Instructions (Signed)
GastrosoleusRight 100, Left 100 Right Hamstrings200 ( 150U to medial and 50 U to lateral )

## 2020-01-12 ENCOUNTER — Other Ambulatory Visit: Payer: Self-pay

## 2020-01-12 ENCOUNTER — Encounter: Payer: Self-pay | Admitting: Internal Medicine

## 2020-01-12 ENCOUNTER — Ambulatory Visit (INDEPENDENT_AMBULATORY_CARE_PROVIDER_SITE_OTHER): Payer: Commercial Managed Care - PPO | Admitting: Internal Medicine

## 2020-01-12 VITALS — BP 131/77 | HR 82 | Temp 95.9°F | Resp 16 | Ht 68.0 in | Wt 183.0 lb

## 2020-01-12 DIAGNOSIS — Z1211 Encounter for screening for malignant neoplasm of colon: Secondary | ICD-10-CM

## 2020-01-12 DIAGNOSIS — E785 Hyperlipidemia, unspecified: Secondary | ICD-10-CM | POA: Diagnosis not present

## 2020-01-12 DIAGNOSIS — I10 Essential (primary) hypertension: Secondary | ICD-10-CM

## 2020-01-12 DIAGNOSIS — Z0001 Encounter for general adult medical examination with abnormal findings: Secondary | ICD-10-CM | POA: Diagnosis not present

## 2020-01-12 DIAGNOSIS — K625 Hemorrhage of anus and rectum: Secondary | ICD-10-CM

## 2020-01-12 DIAGNOSIS — R739 Hyperglycemia, unspecified: Secondary | ICD-10-CM

## 2020-01-12 DIAGNOSIS — R0609 Other forms of dyspnea: Secondary | ICD-10-CM

## 2020-01-12 DIAGNOSIS — Z0000 Encounter for general adult medical examination without abnormal findings: Secondary | ICD-10-CM

## 2020-01-12 DIAGNOSIS — R0600 Dyspnea, unspecified: Secondary | ICD-10-CM | POA: Diagnosis not present

## 2020-01-12 LAB — CBC WITH DIFFERENTIAL/PLATELET
Basophils Absolute: 0 10*3/uL (ref 0.0–0.1)
Basophils Relative: 0.4 % (ref 0.0–3.0)
Eosinophils Absolute: 0.1 10*3/uL (ref 0.0–0.7)
Eosinophils Relative: 1.2 % (ref 0.0–5.0)
HCT: 45.3 % (ref 39.0–52.0)
Hemoglobin: 15.2 g/dL (ref 13.0–17.0)
Lymphocytes Relative: 26.1 % (ref 12.0–46.0)
Lymphs Abs: 1.4 10*3/uL (ref 0.7–4.0)
MCHC: 33.6 g/dL (ref 30.0–36.0)
MCV: 90 fl (ref 78.0–100.0)
Monocytes Absolute: 0.5 10*3/uL (ref 0.1–1.0)
Monocytes Relative: 9.3 % (ref 3.0–12.0)
Neutro Abs: 3.3 10*3/uL (ref 1.4–7.7)
Neutrophils Relative %: 63 % (ref 43.0–77.0)
Platelets: 304 10*3/uL (ref 150.0–400.0)
RBC: 5.04 Mil/uL (ref 4.22–5.81)
RDW: 13.2 % (ref 11.5–15.5)
WBC: 5.3 10*3/uL (ref 4.0–10.5)

## 2020-01-12 LAB — COMPREHENSIVE METABOLIC PANEL
ALT: 23 U/L (ref 0–53)
AST: 26 U/L (ref 0–37)
Albumin: 4.6 g/dL (ref 3.5–5.2)
Alkaline Phosphatase: 57 U/L (ref 39–117)
BUN: 14 mg/dL (ref 6–23)
CO2: 29 mEq/L (ref 19–32)
Calcium: 9.4 mg/dL (ref 8.4–10.5)
Chloride: 101 mEq/L (ref 96–112)
Creatinine, Ser: 0.86 mg/dL (ref 0.40–1.50)
GFR: 91.98 mL/min (ref >60.00)
Glucose, Bld: 99 mg/dL (ref 70–99)
Potassium: 4.5 mEq/L (ref 3.5–5.1)
Sodium: 137 mEq/L (ref 135–145)
Total Bilirubin: 0.7 mg/dL (ref 0.2–1.2)
Total Protein: 7.1 g/dL (ref 6.0–8.3)

## 2020-01-12 LAB — PSA: PSA: 1.03 ng/mL (ref 0.10–4.00)

## 2020-01-12 LAB — LIPID PANEL
Cholesterol: 186 mg/dL (ref 0–200)
HDL: 47.1 mg/dL (ref >39.00)
LDL Cholesterol: 121 mg/dL — ABNORMAL HIGH (ref 0–99)
NonHDL: 139.1
Total CHOL/HDL Ratio: 4
Triglycerides: 91 mg/dL (ref 0.0–149.0)
VLDL: 18.2 mg/dL (ref 0.0–40.0)

## 2020-01-12 LAB — HEMOGLOBIN A1C: Hgb A1c MFr Bld: 6.1 % (ref 4.6–6.5)

## 2020-01-12 MED ORDER — SILDENAFIL CITRATE 20 MG PO TABS: ORAL_TABLET | ORAL | 3 refills | Status: DC

## 2020-01-12 NOTE — Progress Notes (Signed)
Pre visit review using our clinic review tool, if applicable. No additional management support is needed unless otherwise documented below in the visit note. 

## 2020-01-12 NOTE — Progress Notes (Signed)
Subjective:    Patient ID: Adrian Schneider, male    DOB: 11-21-1965, 54 y.o.   MRN: [id number]  DOS:  01/12/2020 Type of visit - description: CPX Here for CPX Has noted weight gain, admits to decreased mobility and not eating healthy. Had red blood with BMs few times several weeks ago.  No other associated symptoms, he thinks is related to hemorrhoids. Also slightly DOE, he thinks is due to weight gain and inactivity.  Denies chest pain, palpitations, lower extremity edema  Review of Systems  Other than above, a 14 point review of systems is negative    Past Medical History:  Diagnosis Date  . 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 Hemorrhoids   . Hyperlipidemia 6/11   started meds  . Hypertension 12/2009  . Left ankle pain    MRI in 06/2015 did not show specific internal derangement, had mild degenerative chondral thinning in the tibiotalar joint w/o a focal lesion  . MVP (mitral valve prolapse)    dx years ago  . Wart    L foot, non- healing lesion, f/u by derm    Past Surgical History:  Procedure Laterality Date  . LEG SURGERY Bilateral    release muscles d/t "cerebral palsy"   Family History  Problem Relation Age of Onset  . Coronary artery disease Mother   . Hypertension Mother   . Skin cancer Mother   . Coronary artery disease Maternal Grandmother   . Hypertension Father   . Diabetes Father   . Heart failure Father   . Lung cancer Other        aunt, uncle , smokers   . Breast cancer Other        cousins  . Stroke Neg Hx   . Colon cancer Neg Hx   . Prostate cancer Neg Hx   . Esophageal cancer Neg Hx   . Stomach cancer Neg Hx   . Rectal cancer Neg Hx     Allergies as of 01/12/2020      Reactions   Dust Mite Extract    Mold Extract [trichophyton] Cough      Medication List       Accurate as of January 12, 2020 11:59 PM. If you have any questions, ask your nurse or doctor.        baclofen 10 MG tablet Commonly known as:  LIORESAL TAKE ONE-HALF TABLET BY MOUTH THREE TIMES DAILY   losartan 50 MG tablet Commonly known as: COZAAR Take 1 tablet (50 mg total) by mouth daily.   sildenafil 20 MG tablet Commonly known as: REVATIO TAKE 3 TO 4 TABLETS BY MOUTH DAILY AS NEEDED   simvastatin 40 MG tablet Commonly known as: ZOCOR Take 1 tablet (40 mg total) by mouth daily.         Objective:   Physical Exam BP 131/77 (BP Location: Left Arm, Patient Position: Sitting, Cuff Size: Small)   Pulse 82   Temp (!) 95.9 F (35.5 C) (Temporal)   Resp 16   Ht 5\' 8"  (1.727 m)   Wt 183 lb (83 kg)   SpO2 98%   BMI 27.83 kg/m   General: Well developed, NAD, BMI noted Neck: No  thyromegaly  HEENT:  Normocephalic . Face symmetric, atraumatic Lungs:  CTA B Normal respiratory effort, no intercostal retractions, no accessory muscle use. Heart: RRR,  no murmur.  Abdomen:  Not distended, soft, non-tender. No rebound or rigidity.  Lower extremities: no pretibial edema bilaterally  DRE: Brown stools, normal sphincter tone, prostate not enlarged, not nodular or tender Skin: Exposed areas without rash. Not pale. Not jaundice Neurologic:  alert & oriented X3.  Speech normal, gait: Poor balance, at baseline. Psych: Cognition and judgment appear intact.  Cooperative with normal attention span and concentration.  Behavior appropriate. No anxious or depressed appearing.     Assessment    Assessment  Prediabetes HTN Hyperlipidemia, 2011 Cerebral palsy,   poor balance DJD - sees [place]  ortho prn  Sees dermatology q year, h/o groin eczema E.D. Tinnitus ~ 11-2016, saw Dr. Cresenciano Lick  PLAN: Here for CPX Prediabetes: Check A1c HTN: BP today is very good, continue losartan.  Monitor BPs recommended High cholesterol: On simvastatin, labs Cerebral palsy, DJD: Per physical medicine, to see Ortho as needed (having some right knee pain) DOE: As described above, EKG today: NSR.  I agree with patient, likely  deconditioning.  To let me know if symptoms progress or they do not resolve when he starts a gradual exercise program. Red blood per rectum: Likely hemorrhoidal, referring him to GI, due for colonoscopy colonoscopy RTC 1 year.  In addition to his physical exam, we addressed chronic medical issues including prediabetes, hypertension and high cholesterol.  Also assessed 2 new complaints, DOE red blood per rectum.  This visit occurred during the SARS-CoV-2 public health emergency.  Safety protocols were in place, including screening questions prior to the visit, additional usage of staff PPE, and extensive cleaning of exam room while observing appropriate contact time as indicated for disinfecting solutions.

## 2020-01-12 NOTE — Patient Instructions (Addendum)
GO TO THE LAB : Get the blood work     [facility] back for a physical exam in 1 year, please make an appointment   Check your blood pressure once or twice a month BP GOAL is between 110/65 and  135/85. If it is consistently higher or lower, let me know  Start a gradual exercise program when possible

## 2020-01-13 NOTE — Assessment & Plan Note (Signed)
-  Td 02-2015 - s/p Shingrix x 2 - had a  flu shot  - plans to get the covid vaccine  -Prostate cancer screening, DRE normal today, check a PSA.   -Colon cancer screening, Cscope 2016, next  due, referral sent -Labs: CMP, FLP, CBC, A1c, PSA -diet, exercise:  Discussed extensively.

## 2020-01-13 NOTE — Assessment & Plan Note (Signed)
Here for CPX Prediabetes: Check A1c HTN: BP today is very good, continue losartan.  Monitor BPs recommended High cholesterol: On simvastatin, labs Cerebral palsy, DJD: Per physical medicine, to see Ortho as needed (having some right knee pain) DOE: As described above, EKG today: NSR.  I agree with patient, likely deconditioning.  To let me know if symptoms progress or they do not resolve when he starts a gradual exercise program. Red blood per rectum: Likely hemorrhoidal, referring him to GI, due for colonoscopy colonoscopy RTC 1 year.

## 2020-02-05 ENCOUNTER — Other Ambulatory Visit: Payer: Self-pay | Admitting: Internal Medicine

## 2020-02-05 MED ORDER — SIMVASTATIN 40 MG PO TABS: 40.0000 mg | ORAL_TABLET | Freq: Every day | ORAL | 3 refills | Status: DC

## 2020-03-29 ENCOUNTER — Encounter: Payer: Commercial Managed Care - PPO | Admitting: Physical Medicine & Rehabilitation

## 2020-04-19 ENCOUNTER — Encounter
Payer: Commercial Managed Care - PPO | Attending: Physical Medicine & Rehabilitation | Admitting: Physical Medicine & Rehabilitation

## 2020-04-19 ENCOUNTER — Other Ambulatory Visit: Payer: Self-pay

## 2020-04-19 ENCOUNTER — Encounter: Payer: Self-pay | Admitting: Physical Medicine & Rehabilitation

## 2020-04-19 VITALS — BP 116/76 | HR 71 | Temp 97.9°F | Ht 68.0 in | Wt 176.0 lb

## 2020-04-19 DIAGNOSIS — G801 Spastic diplegic cerebral palsy: Secondary | ICD-10-CM | POA: Diagnosis present

## 2020-04-19 MED ORDER — BACLOFEN 10 MG PO TABS: ORAL_TABLET | ORAL | 1 refills | Status: DC

## 2020-04-19 NOTE — Patient Instructions (Addendum)
  Recommend quadricep strengthening exercises such as straight leg raising  Also recommend quadricep stretching as well as hamstring stretching  Voltaren gel to knee 4 times a day both sides.  Call if spasticity increases to schedule a repeat Botox injection with 400 units

## 2020-04-19 NOTE — Progress Notes (Signed)
Last Botox injection 01/05/2020 GastrosoleusRight 100, Left 100 Right Hamstrings200 ( 150U to medial and 50 U to lateral )  54 year old male with spastic diplegia secondary to cerebral palsy who returns today with primary complaints of bilateral knee pain mainly area in front of the kneecaps. He has had no falls or trauma to that area  His last Botox injection was approximately 3.5 months ago  He has recently moved and states that he has not been doing any exercises or stretching recently. He has noticed no swelling in the knees no redness.   Examination General no acute distress Mood and affect are appropriate Speech without dysarthria Lower extremity strength is 4 - in the hip flexors knee extensors 3 - at the ankle dorsiflexors. Tone there is elevated tone in the quadriceps.  Also at the hamstrings.  He has decreased range of motion at the ankles bilaterally with dorsiflexion and plantarflexion Ambulation without evidence of toe drag but does have poor heel strike.  Mildly crouched with mild scissoring Right knee no evidence of effusion no pain to palpation along the patella tendon the quadricep tendon or the medial or lateral joint line no tenderness along the hamstring tendons Left knee no evidence of effusion no evidence of erythema there is no tenderness palpation along the quadricep tendon or patella tendon along the medial or lateral joint lines or along the hamstring tendons.  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  Knee pain is likely related to muscle weakness and tightness.  Recommend quadricep and hamstring stretching and patient is aware how to do this Also recommend straight leg raises to increase quadricep strength Voltaren gel to the kneecaps bilaterally 4 times per day At this point patient would like to hold off on his repeat botulinum toxin injection.  His tone does not appear to be severely increased at this point.  He will call back to schedule when  needed

## 2020-04-19 NOTE — Progress Notes (Signed)
Subjective:    Patient ID: Adrian Schneider, male    DOB: [birth year], 54 y.o.   MRN: [id number]  HPI   Pain Inventory Average Pain 1 Pain Right Now 1 My pain is n/a  In the last 24 hours, has pain interfered with the following? General activity 5 Relation with others 0 Enjoyment of life 5 What TIME of day is your pain at its worst? n/a Sleep (in general) n/a  Pain is worse with: some activites Pain improves with: n/a Relief from Meds: n/a  Mobility walk without assistance  Function employed # of hrs/week .  Neuro/Psych No problems in this area  Prior Studies Any changes since last visit?  no  Physicians involved in your care Any changes since last visit?  no   Family History  Problem Relation Age of Onset  . Coronary artery disease Mother   . Hypertension Mother   . Skin cancer Mother   . Coronary artery disease Maternal Grandmother   . Hypertension Father   . Diabetes Father   . Heart failure Father   . Lung cancer Other        aunt, uncle , smokers   . Breast cancer Other        cousins  . Stroke Neg Hx   . Colon cancer Neg Hx   . Prostate cancer Neg Hx   . Esophageal cancer Neg Hx   . Stomach cancer Neg Hx   . Rectal cancer Neg Hx    Social History   Socioeconomic History  . Marital status: Single    Spouse name: Not on file  . Number of children: 0  . Years of education: Not on file  . Highest education level: Not on file  Occupational History  . 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 Smoking status: Never Smoker  . Smokeless tobacco: Never Used  Substance and Sexual Activity  . Alcohol use: Yes    Alcohol/week: 0.0 standard drinks    Comment: socially; weekly 1-2 beers weekly  . Drug use: No  . Sexual activity: Not on file  Other Topics Concern  . Not on file  Social History Narrative   Single, no children, sold his condo, living w/ sister temporarily    Social Determinants of Health    Financial Resource Strain:   . Difficulty of Paying Living Expenses:   Food Insecurity:   . Worried About Charity fundraiser in the Last Year:   . Arboriculturist in the Last Year:   Transportation Needs:   . Film/video editor (Medical):   Marland Kitchen Lack of Transportation (Non-Medical):   Physical Activity:   . Days of Exercise per Week:   . Minutes of Exercise per Session:   Stress:   . Feeling of Stress :   Social Connections:   . Frequency of Communication with Friends and Family:   . Frequency of Social Gatherings with Friends and Family:   . Attends Religious Services:   . Active Member of Clubs or Organizations:   . Attends Archivist Meetings:   Marland Kitchen Marital Status:    Past Surgical History:  Procedure Laterality Date  . LEG SURGERY Bilateral    release muscles d/t "cerebral palsy"   Past Medical History:  Diagnosis Date  . 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 Hemorrhoids   . Hyperlipidemia 6/11  started meds  . Hypertension 12/2009  . Left ankle pain    MRI in 06/2015 did not show specific internal derangement, had mild degenerative chondral thinning in the tibiotalar joint w/o a focal lesion  . MVP (mitral valve prolapse)    dx years ago  . Wart    L foot, non- healing lesion, f/u by derm   BP 116/76   Pulse 71   Temp 97.9 F (36.6 C)   Ht 5\' 8"  (1.727 m)   Wt 176 lb (79.8 kg)   SpO2 96%   BMI 26.76 kg/m   Opioid Risk Score:   Fall Risk Score:  `1  Depression screen PHQ 2/9  Depression screen [facility] 2/9 01/12/2020 07/24/2018 03/17/2018 10/18/2017 10/18/2017 09/30/2017 05/16/2016  Decreased Interest 0 0 0 0 0 0 0  Down, Depressed, Hopeless 0 0 0 0 0 0 0  PHQ - 2 Score 0 0 0 0 0 0 0  Altered sleeping - - - 0 - - -  Tired, decreased energy - - - 0 - - -  Change in appetite - - - 0 - - -  Feeling bad or failure about yourself  - - - 0 - - -  Trouble concentrating - - - 0 - - -  Moving slowly or fidgety/restless - - - 0 - - -   Suicidal thoughts - - - 0 - - -  PHQ-9 Score - - - 0 - - -  Difficult doing work/chores - - - Not difficult at all - - -    Review of Systems  Constitutional: Negative.   HENT: Negative.   Eyes: Negative.   Respiratory: Negative.   Cardiovascular: Negative.   Gastrointestinal: Negative.   Endocrine: Negative.   Genitourinary: Negative.   Musculoskeletal: Negative.   Skin: Negative.   Allergic/Immunologic: Negative.   Neurological: Negative.   Hematological: Negative.   Psychiatric/Behavioral: Negative.   All other systems reviewed and are negative.      Objective:   Physical Exam        Assessment & Plan:

## 2020-06-10 ENCOUNTER — Encounter: Payer: Self-pay | Admitting: Internal Medicine

## 2020-06-13 ENCOUNTER — Encounter: Payer: Self-pay | Admitting: Internal Medicine

## 2020-07-29 ENCOUNTER — Ambulatory Visit (AMBULATORY_SURGERY_CENTER): Payer: Self-pay

## 2020-07-29 ENCOUNTER — Other Ambulatory Visit: Payer: Self-pay

## 2020-07-29 ENCOUNTER — Encounter: Payer: Self-pay | Admitting: Internal Medicine

## 2020-07-29 VITALS — Ht 68.0 in | Wt 181.0 lb

## 2020-07-29 DIAGNOSIS — Z86010 Personal history of colonic polyps: Secondary | ICD-10-CM

## 2020-07-29 MED ORDER — PLENVU 140 G PO SOLR: 1.0000 | Freq: Once | ORAL | 0 refills | Status: AC

## 2020-07-29 NOTE — Progress Notes (Signed)
No egg or soy allergy known to patient  No issues with past sedation with any surgeries or procedures no intubation problems in the past  No FH of Malignant Hyperthermia No diet pills per patient No home 02 use per patient  No blood thinners per patient  Pt denies issues with constipation  No A fib or A flutter  EMMI video to pt or via [place] 19 guidelines implemented in PV today with Pt and RN   Plenvu  Coupon given to pt in PV today , Code to Pharmacy   Pt has been vaccinated for covid.  Due to the COVID-19 pandemic we are asking patients to follow these guidelines. Please only bring one care partner. Please be aware that your care partner may wait in the car in the parking lot or if they feel like they will be too hot to wait in the car, they may wait in the lobby on the 4th floor. All care partners are required to wear a mask the entire time (we do not have any that we can provide them), they need to practice social distancing, and we will do a Covid check for all patient's and care partners when you arrive. Also we will check their temperature and your temperature. If the care partner waits in their car they need to stay in the parking lot the entire time and we will call them on their cell phone when the patient is ready for discharge so they can bring the car to the front of the building. Also all patient's will need to wear a mask into building.

## 2020-08-01 ENCOUNTER — Other Ambulatory Visit: Payer: Self-pay

## 2020-08-01 ENCOUNTER — Encounter: Payer: Self-pay | Admitting: Family Medicine

## 2020-08-01 ENCOUNTER — Ambulatory Visit: Payer: Commercial Managed Care - PPO | Admitting: Family Medicine

## 2020-08-01 VITALS — BP 118/80 | HR 77 | Temp 98.2°F | Ht 68.0 in | Wt 182.4 lb

## 2020-08-01 DIAGNOSIS — M7022 Olecranon bursitis, left elbow: Secondary | ICD-10-CM

## 2020-08-01 HISTORY — PX: INCISION AND DRAINAGE: SHX5863

## 2020-08-01 MED ORDER — METHYLPREDNISOLONE ACETATE 40 MG/ML IJ SUSP
20.0000 mg | Freq: Once | INTRAMUSCULAR | Status: AC
  Administered 2020-08-01: 20 mg via INTRA_ARTICULAR

## 2020-08-01 NOTE — Progress Notes (Signed)
Musculoskeletal Exam  Patient: Adrian Schneider DOB: 27-Jan-1966  DOS: 08/01/2020  SUBJECTIVE:  Chief Complaint:   Chief Complaint  Patient presents with  . Elbow Injury    left elbow swelling    Adrian Schneider is a 54 y.o.  male for evaluation and treatment of L elbow pain.   Onset:  5 weeks ago. [place] and landed on it Location: L elbow Character:  aching and dull  Progression of issue:  Slightly better Associated symptoms: area swole up; no bruising, redness, fevers, decreased ROM Treatment: to date has been rest and ice.   Neurovascular symptoms: no  Past Medical History:  Diagnosis Date  . Allergy    dust mold  . 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 Concussion 07/19/2016   fell down stair onto concrete  . Hemorrhoids   . Hyperlipidemia 6/11   started meds  . Hypertension 12/2009  . Left ankle pain    MRI in 06/2015 did not show specific internal derangement, had mild degenerative chondral thinning in the tibiotalar joint w/o a focal lesion  . MVP (mitral valve prolapse)    dx years ago  . Wart    L foot, non- healing lesion, f/u by derm    Objective: VITAL SIGNS: BP 118/80 (BP Location: Left Arm, Patient Position: Sitting, Cuff Size: Normal)   Pulse 77   Temp 98.2 F (36.8 C) (Oral)   Ht 5\' 8"  (1.727 m)   Wt 182 lb 6 oz (82.7 kg)   SpO2 97%   BMI 27.73 kg/m  Constitutional: Well formed, well developed. No acute distress. Thorax & Lungs: No accessory muscle use Musculoskeletal: L elbow.   Normal active range of motion: yes.   Normal passive range of motion: Yes Tenderness to palpation: mild over olecranon process Deformity: large mass over olecranon Ecchymosis: no Neurologic: No cerebellar signs Psychiatric: Normal mood. Age appropriate judgment and insight. Alert & oriented x 3.    Procedure Note; Knee injection Verbal consent obtained. Area of interest marked and cleaned w alcohol. Freeze spray was used. A 23-gauge needle  was used to enter the bursa and 18 mL of bloody fluid was removed. 20 mg of Depomedrol with 1 mL of 1% lidocaine was then injected. A bandage was placed. The patient tolerated the procedure well. There were no complications noted.  Assessment:  Olecranon bursitis of left elbow - Plan: Cytology - non gyn, PR DRAIN/INJECT INTERMEDIATE JOINT/BURSA  Plan: Ice, Tylenol.  Will ck for urate and CPP crystals.  F/u as originally scheduled w reg PCP.Marland Kitchen The patient voiced understanding and agreement to the plan.   [place], DO 08/01/20  11:41 AM

## 2020-08-01 NOTE — Patient Instructions (Addendum)
Ice/cold pack over area for 10-15 min twice daily.  OK to take Tylenol 1000 mg (2 extra strength tabs) or 975 mg (3 regular strength tabs) every 6 hours as needed.   Give [place] a week to get your fluid results back.   Let us know if you need anything.

## 2020-08-01 NOTE — Addendum Note (Signed)
Addended by: Sharon Seller B on: 08/01/2020 11:49 AM   Modules accepted: Orders

## 2020-08-02 ENCOUNTER — Telehealth: Payer: Self-pay | Admitting: Internal Medicine

## 2020-08-02 ENCOUNTER — Other Ambulatory Visit (HOSPITAL_COMMUNITY)
Admit: 2020-08-02 | Discharge: 2020-08-02 | Disposition: A | Discharge status: Home / Self Care | Payer: Commercial Managed Care - PPO | Source: Ambulatory Visit | Attending: Family Medicine | Admitting: Family Medicine

## 2020-08-02 DIAGNOSIS — M7022 Olecranon bursitis, left elbow: Secondary | ICD-10-CM | POA: Insufficient documentation

## 2020-08-02 LAB — SYNOVIAL CELL COUNT + DIFF, W/ CRYSTALS
Crystals, Fluid: NONE SEEN
Lymphocytes-Synovial Fld: 72 % — ABNORMAL HIGH (ref 0–20)
Monocyte-Macrophage-Synovial Fluid: 18 % — ABNORMAL LOW (ref 50–90)
Neutrophil, Synovial: 10 % (ref 0–25)
WBC, Synovial: 319 /mm3 — ABNORMAL HIGH (ref 0–200)

## 2020-08-02 NOTE — Telephone Encounter (Signed)
Called John Sevier and clarified what was needed.

## 2020-08-02 NOTE — Telephone Encounter (Signed)
Caller : Denise Call Back # [phone]  Calling in reference to elbow fluid sent to cytology , per Langley Gauss she want to make sure it should have been sent to their office for testing. Per Langley Gauss it normally go to pathology.

## 2020-08-11 ENCOUNTER — Encounter: Payer: Self-pay | Admitting: Internal Medicine

## 2020-08-11 ENCOUNTER — Telehealth: Payer: Self-pay | Admitting: Internal Medicine

## 2020-08-11 ENCOUNTER — Ambulatory Visit (AMBULATORY_SURGERY_CENTER): Payer: Commercial Managed Care - PPO | Admitting: Internal Medicine

## 2020-08-11 ENCOUNTER — Other Ambulatory Visit: Payer: Self-pay

## 2020-08-11 VITALS — BP 105/64 | HR 69 | Temp 97.1°F | Resp 9 | Ht 68.0 in | Wt 181.0 lb

## 2020-08-11 DIAGNOSIS — D123 Benign neoplasm of transverse colon: Secondary | ICD-10-CM

## 2020-08-11 DIAGNOSIS — D122 Benign neoplasm of ascending colon: Secondary | ICD-10-CM

## 2020-08-11 DIAGNOSIS — Z86010 Personal history of colonic polyps: Secondary | ICD-10-CM

## 2020-08-11 MED ORDER — SODIUM CHLORIDE 0.9 % IV SOLN: 500.0000 mL | Freq: Once | INTRAVENOUS | Status: DC

## 2020-08-11 NOTE — Progress Notes (Signed)
Report to PACU, RN, vss, BBS= Clear.  

## 2020-08-11 NOTE — Patient Instructions (Signed)
  Handout on polyps given to you today   Await pathology results on polyps removed    YOU HAD AN ENDOSCOPIC PROCEDURE TODAY AT THE [facility]:   Refer to the procedure report that was given to you for any specific questions about what was found during the examination.  If the procedure report does not answer your questions, please call your gastroenterologist to clarify.  If you requested that your care partner not be given the details of your procedure findings, then the procedure report has been included in a sealed envelope for you to review at your convenience later.  YOU SHOULD EXPECT: Some feelings of bloating in the abdomen. Passage of more gas than usual.  Walking can help get rid of the air that was put into your GI tract during the procedure and reduce the bloating. If you had a lower endoscopy (such as a colonoscopy or flexible sigmoidoscopy) you may notice spotting of blood in your stool or on the toilet paper. If you underwent a bowel prep for your procedure, you may not have a normal bowel movement for a few days.  Please Note:  You might notice some irritation and congestion in your nose or some drainage.  This is from the oxygen used during your procedure.  There is no need for concern and it should clear up in a day or so.  SYMPTOMS TO REPORT IMMEDIATELY:  Following lower endoscopy (colonoscopy or flexible sigmoidoscopy):  Excessive amounts of blood in the stool  Significant tenderness or worsening of abdominal pains  Swelling of the abdomen that is new, acute  Fever of 100F or higher  For urgent or emergent issues, a gastroenterologist can be reached at any hour by calling [phone]. Do not use MyChart messaging for urgent concerns.    DIET:  We do recommend a small meal at first, but then you may proceed to your regular diet.  Drink plenty of fluids but you should avoid alcoholic beverages for 24 hours.  ACTIVITY:  You should plan to take it easy  for the rest of today and you should NOT DRIVE or use heavy machinery until tomorrow (because of the sedation medicines used during the test).    FOLLOW UP: Our staff will call the number listed on your records 48-72 hours following your procedure to check on you and address any questions or concerns that you may have regarding the information given to you following your procedure. If we do not reach you, we will leave a message.  We will attempt to reach you two times.  During this call, we will ask if you have developed any symptoms of COVID 19. If you develop any symptoms (ie: fever, flu-like symptoms, shortness of breath, cough etc.) before then, please call [phone].  If you test positive for Covid 19 in the 2 weeks post procedure, please call and report this information to us.    If any biopsies were taken you will be contacted by phone or by letter within the next 1-3 weeks.  Please call us at [phone] if you have not heard about the biopsies in 3 weeks.    SIGNATURES/CONFIDENTIALITY: You and/or your care partner have signed paperwork which will be entered into your electronic medical record.  These signatures attest to the fact that that the information above on your After Visit Summary has been reviewed and is understood.  Full responsibility of the confidentiality of this discharge information lies with you and/or your care-partner.    care-partner.

## 2020-08-11 NOTE — Op Note (Signed)
[facility] Patient Name: Adrian Schneider Procedure Date: 08/11/2020 9:13 AM MRN: [id number] Endoscopist: Docia Chuck. Henrene Pastor , MD Age: 54 Referring MD:  Date of Birth: [birth year] Gender: Male Account #: [id number] Procedure:                Colonoscopy with cold snare polypectomy x 2 Indications:              High risk colon cancer surveillance: Personal                            history of non-advanced adenomas. Previous                            examination 2016 Medicines:                Monitored Anesthesia Care Procedure:                Pre-Anesthesia Assessment:                           - Prior to the procedure, a History and Physical                            was performed, and patient medications and                            allergies were reviewed. The patient's tolerance of                            previous anesthesia was also reviewed. The risks                            and benefits of the procedure and the sedation                            options and risks were discussed with the patient.                            All questions were answered, and informed consent                            was obtained. Prior Anticoagulants: The patient has                            taken no previous anticoagulant or antiplatelet                            agents. ASA Grade Assessment: II - A patient with                            mild systemic disease. After reviewing the risks                            and benefits, the patient was deemed in  satisfactory condition to undergo the procedure.                           After obtaining informed consent, the colonoscope                            was passed under direct vision. Throughout the                            procedure, the patient's blood pressure, pulse, and                            oxygen saturations were monitored continuously. The                            Colonoscope was introduced  through the anus and                            advanced to the the cecum, identified by                            appendiceal orifice and ileocecal valve. Anatomical                            landmarks were photographed. The quality of the                            bowel preparation was excellent. The colonoscopy                            was performed without difficulty. The patient                            tolerated the procedure well. The bowel preparation                            used was SUPREP via split dose instruction. Scope In: 9:29:08 AM Scope Out: 9:44:36 AM Scope Withdrawal Time: 0 hours 10 minutes 38 seconds  Total Procedure Duration: 0 hours 15 minutes 28 seconds  Findings:                 Two sessile polyps were found in the transverse                            colon and ascending colon. The polyps were 2 to 3                            mm in size. These polyps were removed with a cold                            snare. Resection and retrieval were complete.                           The exam was otherwise without  abnormality on                            direct and retroflexion views. Complications:            No immediate complications. Estimated blood loss:                            None. Estimated Blood Loss:     Estimated blood loss: none. Impression:               - Two 2 to 3 mm polyps in the transverse colon and                            in the ascending colon, removed with a cold snare.                            Resected and retrieved.                           - The examination was otherwise normal on direct                            and retroflexion views. Recommendation:           - Repeat colonoscopy in 5 years for surveillance.                           - Patient has a contact number available for                            emergencies. The signs and symptoms of potential                            delayed complications were discussed with the                             patient. Return to normal activities tomorrow.                            Written discharge instructions were provided to the                            patient.                           - Resume previous diet.                           - Continue present medications.                           - Await pathology results. Docia Chuck. Henrene Pastor, MD 08/11/2020 9:53:32 AM This report has been signed electronically.

## 2020-08-11 NOTE — Progress Notes (Signed)
Pt's states no medical or surgical changes since previsit or office visit.  VS EW  

## 2020-08-11 NOTE — Progress Notes (Signed)
Called to room to assist during endoscopic procedure.  Patient ID and intended procedure confirmed with present staff. Received instructions for my participation in the procedure from the performing physician.  

## 2020-08-15 ENCOUNTER — Telehealth: Payer: Self-pay

## 2020-08-15 ENCOUNTER — Telehealth: Payer: Self-pay | Admitting: *Deleted

## 2020-08-15 NOTE — Telephone Encounter (Signed)
1st follow up call made.  NALM 

## 2020-08-15 NOTE — Telephone Encounter (Signed)
Attempted 2nd f/u phone call. No answer. Left message.  °

## 2020-08-17 ENCOUNTER — Encounter: Payer: Self-pay | Admitting: Internal Medicine

## 2020-08-31 ENCOUNTER — Other Ambulatory Visit: Payer: Self-pay | Admitting: Internal Medicine

## 2021-01-11 ENCOUNTER — Other Ambulatory Visit: Payer: Self-pay

## 2021-01-12 ENCOUNTER — Other Ambulatory Visit: Payer: Self-pay

## 2021-01-12 ENCOUNTER — Encounter: Payer: Self-pay | Admitting: Internal Medicine

## 2021-01-12 ENCOUNTER — Ambulatory Visit (INDEPENDENT_AMBULATORY_CARE_PROVIDER_SITE_OTHER): Payer: Commercial Managed Care - PPO | Admitting: Internal Medicine

## 2021-01-12 VITALS — BP 144/76 | HR 97 | Temp 98.4°F | Resp 18 | Ht 68.0 in | Wt 189.5 lb

## 2021-01-12 DIAGNOSIS — I10 Essential (primary) hypertension: Secondary | ICD-10-CM | POA: Diagnosis not present

## 2021-01-12 DIAGNOSIS — R739 Hyperglycemia, unspecified: Secondary | ICD-10-CM

## 2021-01-12 DIAGNOSIS — E785 Hyperlipidemia, unspecified: Secondary | ICD-10-CM | POA: Diagnosis not present

## 2021-01-12 DIAGNOSIS — Z0000 Encounter for general adult medical examination without abnormal findings: Secondary | ICD-10-CM

## 2021-01-12 LAB — CBC WITH DIFFERENTIAL/PLATELET
Basophils Absolute: 0 10*3/uL (ref 0.0–0.1)
Basophils Relative: 0.6 % (ref 0.0–3.0)
Eosinophils Absolute: 0.1 10*3/uL (ref 0.0–0.7)
Eosinophils Relative: 2 % (ref 0.0–5.0)
HCT: 42.8 % (ref 39.0–52.0)
Hemoglobin: 14.8 g/dL (ref 13.0–17.0)
Lymphocytes Relative: 26.4 % (ref 12.0–46.0)
Lymphs Abs: 1.3 10*3/uL (ref 0.7–4.0)
MCHC: 34.4 g/dL (ref 30.0–36.0)
MCV: 90.2 fl (ref 78.0–100.0)
Monocytes Absolute: 0.4 10*3/uL (ref 0.1–1.0)
Monocytes Relative: 9 % (ref 3.0–12.0)
Neutro Abs: 3 10*3/uL (ref 1.4–7.7)
Neutrophils Relative %: 62 % (ref 43.0–77.0)
Platelets: 309 10*3/uL (ref 150.0–400.0)
RBC: 4.75 Mil/uL (ref 4.22–5.81)
RDW: 13.5 % (ref 11.5–15.5)
WBC: 4.9 10*3/uL (ref 4.0–10.5)

## 2021-01-12 LAB — LIPID PANEL
Cholesterol: 157 mg/dL (ref 0–200)
HDL: 46.4 mg/dL (ref >39.00)
LDL Cholesterol: 95 mg/dL (ref 0–99)
NonHDL: 110.73
Total CHOL/HDL Ratio: 3
Triglycerides: 77 mg/dL (ref 0.0–149.0)
VLDL: 15.4 mg/dL (ref 0.0–40.0)

## 2021-01-12 LAB — COMPREHENSIVE METABOLIC PANEL
ALT: 30 U/L (ref 0–53)
AST: 26 U/L (ref 0–37)
Albumin: 4.2 g/dL (ref 3.5–5.2)
Alkaline Phosphatase: 58 U/L (ref 39–117)
BUN: 9 mg/dL (ref 6–23)
CO2: 29 mEq/L (ref 19–32)
Calcium: 8.8 mg/dL (ref 8.4–10.5)
Chloride: 103 mEq/L (ref 96–112)
Creatinine, Ser: 0.93 mg/dL (ref 0.40–1.50)
GFR: 91.45 mL/min (ref >60.00)
Glucose, Bld: 99 mg/dL (ref 70–99)
Potassium: 4.5 mEq/L (ref 3.5–5.1)
Sodium: 138 mEq/L (ref 135–145)
Total Bilirubin: 0.6 mg/dL (ref 0.2–1.2)
Total Protein: 6.8 g/dL (ref 6.0–8.3)

## 2021-01-12 LAB — HEMOGLOBIN A1C: Hgb A1c MFr Bld: 5.9 % (ref 4.6–6.5)

## 2021-01-12 MED ORDER — LOSARTAN POTASSIUM 50 MG PO TABS
50.0000 mg | ORAL_TABLET | Freq: Every day | ORAL | 1 refills | Status: DC
Start: 2021-01-12 — End: 2021-02-13

## 2021-01-12 MED ORDER — SILDENAFIL CITRATE 20 MG PO TABS: ORAL_TABLET | ORAL | 3 refills | Status: DC

## 2021-01-12 MED ORDER — SIMVASTATIN 40 MG PO TABS
40.0000 mg | ORAL_TABLET | Freq: Every day | ORAL | 1 refills | Status: DC
Start: 2021-01-12 — End: 2021-02-13

## 2021-01-12 NOTE — Patient Instructions (Addendum)
Watch your diet carefully, try to increase yuor physical activity.  Check the  blood pressure   BP GOAL is between 110/65 and  135/85. If it is consistently higher or lower, let me know   GO TO THE LAB : Get the blood work     [place], [place] back for a checkup in 6 months    Advance Directive  Advance directives are legal documents that allow you to make decisions about your health care and medical treatment in case you become unable to communicate for yourself. Advance directives let your wishes be known to family, friends, and health care providers. Discussing and writing advance directives should happen over time rather than all at once. Advance directives can be changed and updated at any time. There are different types of advance directives, such as:  Medical power of attorney.  Living will.  Do not resuscitate (DNR) order or do not attempt resuscitation (DNAR) order. Health care proxy and medical power of attorney A health care proxy is also called a health care agent. This person is appointed to make medical decisions for you when you are unable to make decisions for yourself. Generally, people ask a trusted friend or family member to act as their proxy and represent their preferences. Make sure you have an agreement with your trusted person to act as your proxy. A proxy may have to make a medical decision on your behalf if your wishes are not known. A medical power of attorney, also called a durable power of attorney for health care, is a legal document that names your health care proxy. Depending on the laws in your state, the document may need to be:  Signed.  Notarized.  Dated.  Copied.  Witnessed.  Incorporated into your medical record. You may also want to appoint a trusted person to manage your money in the event you are unable to do so. This is called a durable power of attorney for finances. It is a separate legal  document from the durable power of attorney for health care. You may choose your health care proxy or someone different to act as your agent in money matters. If you do not appoint a proxy, or there is a concern that the proxy is not acting in your best interest, a court may appoint a guardian to act on your behalf. Living will A living will is a set of instructions that state your wishes about medical care when you cannot express them yourself. Health care providers should keep a copy of your living will in your medical record. You may want to give a copy to family members or friends. To alert caregivers in case of an emergency, you can place a card in your wallet to let them know that you have a living will and where they can find it. A living will is used if you become:  Terminally ill.  Disabled.  Unable to communicate or make decisions. The following decisions should be included in your living will:  To use or not to use life support equipment, such as dialysis machines and breathing machines (ventilators).  Whether you want a DNR or DNAR order. This tells health care providers not to use cardiopulmonary resuscitation (CPR) if breathing or heartbeat stops.  To use or not to use tube feeding.  To be given or not to be given food and fluids.  Whether you want comfort (palliative) care when the goal becomes comfort rather  than a cure.  Whether you want to donate your organs and tissues. A living will does not give instructions for distributing your money and property if you should pass away. DNR or DNAR A DNR or DNAR order is a request not to have CPR in the event that your heart stops beating or you stop breathing. If a DNR or DNAR order has not been made and shared, a health care provider will try to help any patient whose heart has stopped or who has stopped breathing. If you plan to have surgery, talk with your health care provider about how your DNR or DNAR order will be followed if  problems occur. What if I do not have an advance directive? Some states assign family decision makers to act on your behalf if you do not have an advance directive. Each state has its own laws about advance directives. You may want to check with your health care provider, attorney, or state representative about the laws in your state. Summary  Advance directives are legal documents that allow you to make decisions about your health care and medical treatment in case you become unable to communicate for yourself.  The process of discussing and writing advance directives should happen over time. You can change and update advance directives at any time.  Advance directives may include a medical power of attorney, a living will, and a DNR or DNAR order. This information is not intended to replace advice given to you by your health care provider. Make sure you discuss any questions you have with your health care provider. Document Revised: 08/09/2020 Document Reviewed: 08/09/2020 Elsevier Patient Education  2021 [employer].

## 2021-01-12 NOTE — Progress Notes (Signed)
Subjective:    Patient ID: Adrian Schneider, male    DOB: 02/01/1966, 55 y.o.   MRN: [id number]  DOS:  01/12/2021 Type of visit - description: CPX  Since the last visit, admits he is not doing well with diet or exercise. Has gained some weight. Has been busy  helping his father  Wt Readings from Last 3 Encounters:  01/12/21 189 lb 8 oz (86 kg)  08/11/20 181 lb (82.1 kg)  08/01/20 182 lb 6 oz (82.7 kg)   BP Readings from Last 3 Encounters:  01/12/21 (!) 144/76  08/11/20 105/64  08/01/20 118/80      Review of Systems Still occasionally DOE as last year but denies chest pain, lower extremity edema or palpitations  Other than above, a 14 point review of systems is negative     Past Medical History:  Diagnosis Date   Allergy    dust mold   Cerebral palsy ([place])    dx at birth, has poor balance   Concussion 07/19/2016   fell down stair onto concrete   Hemorrhoids    Hyperlipidemia 6/11   started meds   Hypertension 12/2009   Left ankle pain    MRI in 06/2015 did not show specific internal derangement, had mild degenerative chondral thinning in the tibiotalar joint w/o a focal lesion   MVP (mitral valve prolapse)    dx years ago   Wart    L foot, non- healing lesion, f/u by derm    Past Surgical History:  Procedure Laterality Date   COLONOSCOPY  2016   INCISION AND DRAINAGE Left 08/01/2020   LEG SURGERY Bilateral    release muscles d/t "cerebral palsy"    Allergies as of 01/12/2021      Reactions   Dust Mite Extract    Mold Extract [trichophyton] Cough      Medication List       Accurate as of January 12, 2021 11:59 PM. If you have any questions, ask your nurse or doctor.        baclofen 10 MG tablet Commonly known as: LIORESAL TAKE ONE-HALF TABLET BY MOUTH THREE TIMES DAILY   losartan 50 MG tablet Commonly known as: COZAAR Take 1 tablet (50 mg total) by mouth daily.   sildenafil 20 MG tablet Commonly known as: REVATIO TAKE 3 TO 4  TABLETS BY MOUTH DAILY AS NEEDED   simvastatin 40 MG tablet Commonly known as: ZOCOR Take 1 tablet (40 mg total) by mouth daily.          Objective:   Physical Exam BP (!) 144/76 (BP Location: Right Arm, Patient Position: Sitting, Cuff Size: Normal)    Pulse 97    Temp 98.4 F (36.9 C) (Oral)    Resp 18    Ht 5\' 8"  (1.727 m)    Wt 189 lb 8 oz (86 kg)    SpO2 98%    BMI 28.81 kg/m  General: Well developed, NAD, BMI noted Neck: No  thyromegaly  HEENT:  Normocephalic . Face symmetric, atraumatic Lungs:  CTA B Normal respiratory effort, no intercostal retractions, no accessory muscle use. Heart: RRR,  no murmur.  Abdomen:  Not distended, soft, non-tender. No rebound or rigidity.   Lower extremities: no pretibial edema bilaterally  Skin: Exposed areas without rash. Not pale. Not jaundice Neurologic:  alert & oriented X3.  Speech normal, gait at baseline Psych: Cognition and judgment appear intact.  Cooperative with normal attention span and concentration.  Behavior appropriate.  No anxious or depressed appearing.     Assessment      Assessment  Prediabetes HTN Hyperlipidemia, 2011 Cerebral palsy,   poor balance DJD - sees [facility]  ortho prn  Sees dermatology q year, h/o groin eczema E.D. Tinnitus ~ 11-2016, saw Dr. Cresenciano Lick  PLAN: Here for CPX Prediabetes: Check A1c HTN: On losartan, BP today slightly elevated, typically okay, recommend to check ambulatory BPs, watch diet, reassess in 6 months High cholesterol: Continue simvastatin, checking labs Cerebral palsy, DJD: Much improved after injections provided by physical medicine.  Does not plan to go back to them at this point.  Recommend to take baclofen as needed, call for Rx if needed. ED: Refill sildenafil RTC 6 months  This visit occurred during the SARS-CoV-2 public health emergency.  Safety protocols were in place, including screening questions prior to the visit, additional usage of staff PPE, and extensive  cleaning of exam room while observing appropriate contact time as indicated for disinfecting solutions.

## 2021-01-12 NOTE — Progress Notes (Signed)
Pre visit review using our clinic review tool, if applicable. No additional management support is needed unless otherwise documented below in the visit note. 

## 2021-01-15 ENCOUNTER — Encounter: Payer: Self-pay | Admitting: Internal Medicine

## 2021-01-15 NOTE — Assessment & Plan Note (Signed)
Here for CPX Prediabetes: Check A1c HTN: On losartan, BP today slightly elevated, typically okay, recommend to check ambulatory BPs, watch diet, reassess in 6 months High cholesterol: Continue simvastatin, checking labs Cerebral palsy, DJD: Much improved after injections provided by physical medicine.  Does not plan to go back to them at this point.  Recommend to take baclofen as needed, call for Rx if needed. ED: Refill sildenafil RTC 6 months

## 2021-01-15 NOTE — Assessment & Plan Note (Signed)
-  Td 02-2015 -s/pShingrixx 2 -COVID VAX x3 - had a flu shot   -Prostate cancer screening, DRE and PSA normal 2021 -Colon cancer screening, Cscope 2016, C-scope 07-2020, next per GI -Labs: CMP, FLP, CBC, A1c -diet, exercise: Discussed extensively again today.

## 2021-02-11 ENCOUNTER — Other Ambulatory Visit: Payer: Self-pay | Admitting: Internal Medicine

## 2021-02-12 ENCOUNTER — Other Ambulatory Visit: Payer: Self-pay | Admitting: Internal Medicine

## 2021-03-30 ENCOUNTER — Encounter: Payer: Self-pay | Admitting: Internal Medicine

## 2021-04-18 ENCOUNTER — Telehealth: Payer: Self-pay

## 2021-04-18 ENCOUNTER — Other Ambulatory Visit: Payer: Self-pay

## 2021-04-18 ENCOUNTER — Other Ambulatory Visit (HOSPITAL_BASED_OUTPATIENT_CLINIC_OR_DEPARTMENT_OTHER): Payer: Self-pay

## 2021-04-18 ENCOUNTER — Encounter: Payer: Self-pay | Admitting: Internal Medicine

## 2021-04-18 ENCOUNTER — Telehealth (INDEPENDENT_AMBULATORY_CARE_PROVIDER_SITE_OTHER): Payer: Commercial Managed Care - PPO | Admitting: Internal Medicine

## 2021-04-18 VITALS — BP 139/85 | HR 86 | Ht 68.0 in | Wt 190.0 lb

## 2021-04-18 DIAGNOSIS — U071 COVID-19: Secondary | ICD-10-CM

## 2021-04-18 MED ORDER — NIRMATRELVIR/RITONAVIR (PAXLOVID)TABLET
3.0000 | ORAL_TABLET | Freq: Two times a day (BID) | ORAL | 0 refills | Status: AC
  Filled 2021-04-18: qty 30, 5d supply, fill #0

## 2021-04-18 NOTE — Progress Notes (Signed)
Subjective:    Patient ID: Adrian Schneider, male    DOB: [birth year], 55 y.o.   MRN: [id number]  DOS:  04/18/2021 Type of visit - description: Virtual Visit via Video Note  I connected with the above patient  by a video enabled telemedicine application and verified that I am speaking with the correct person using two identifiers.   THIS ENCOUNTER IS A VIRTUAL VISIT DUE TO COVID-19 - PATIENT WAS NOT SEEN IN THE OFFICE. PATIENT HAS CONSENTED TO VIRTUAL VISIT / TELEMEDICINE VISIT   Location of patient: home  Location of provider: office  Persons participating in the virtual visit: patient, provider   I discussed the limitations of evaluation and management by telemedicine and the availability of in person appointments. The patient expressed understanding and agreed to proceed.  Acute Symptoms a started 04/15/2021: Postnasal dripping, cough with some sputum production, shakiness, chills.  He checked his temperature and there was no actually fever. Initial test for COVID was negative, he retested 04/16/2021 and it came back positive.  Denies chest pain or difficulty breathing. Good p.o. intake No nausea or vomiting. No diarrhea, no rash. Mild headache.   Review of Systems See above   Past Medical History:  Diagnosis Date  . Allergy    dust mold  . Cerebral palsy ([place])    dx at birth, has poor balance  . Concussion 07/19/2016   fell down stair onto concrete  . Hemorrhoids   . Hyperlipidemia 6/11   started meds  . Hypertension 12/2009  . Left ankle pain    MRI in 06/2015 did not show specific internal derangement, had mild degenerative chondral thinning in the tibiotalar joint w/o a focal lesion  . MVP (mitral valve prolapse)    dx years ago  . Wart    L foot, non- healing lesion, f/u by derm    Past Surgical History:  Procedure Laterality Date  . COLONOSCOPY  2016  . INCISION AND DRAINAGE Left 08/01/2020  . LEG SURGERY Bilateral    release muscles d/t "cerebral  palsy"    Allergies as of 04/18/2021      Reactions   Dust Mite Extract    Mold Extract [trichophyton] Cough      Medication List       Accurate as of Apr 18, 2021  4:09 PM. If you have any questions, ask your nurse or doctor.        baclofen 10 MG tablet Commonly known as: LIORESAL TAKE ONE-HALF TABLET BY MOUTH THREE TIMES DAILY   losartan 50 MG tablet Commonly known as: COZAAR TAKE 1 TABLET(50 MG) BY MOUTH DAILY   sildenafil 20 MG tablet Commonly known as: REVATIO TAKE 3 TO 4 TABLETS BY MOUTH DAILY AS NEEDED   simvastatin 40 MG tablet Commonly known as: ZOCOR Take 1 tablet (40 mg total) by mouth daily.          Objective:   Physical Exam BP 139/85   Pulse 86   Ht 5\' 8"  (1.727 m)   Wt 190 lb (86.2 kg)   SpO2 97%   BMI 28.89 kg/m  This is a virtual video visit, alert oriented x3, in no distress.  O2 sats ranged from 93-97, the last day or so it has been 97% area.       Assessment    Assessment  Prediabetes HTN Hyperlipidemia, 2011 Cerebral palsy,   poor balance DJD - sees [facility]o prn   Sees dermatology q year, h/o groin  eczema E.D. Tinnitus ~ 11-2016, saw Dr. Cresenciano Lick  PLAN: COVID-19. Patient is 55, he had 3 COVID vaccines, he developed symptoms 3 days ago, tested + 2 days ago. Vital signs are stable, O2 sat in the 97% today.  He does not look toxic. Plan: Rest, fluids, Robitussin-DM. Options for treatment include:Paxlovid, MAB, others. I recommend Paxlovid and  he agrees. He will hold simvastatin, baclofen. Definitely watch for worsening symptoms or rebound after medication. To call if not gradually better ER if O2 sat consistently less than 94%.   Okay to proceed with a COVID booster  in 3 to 4 months   I discussed the assessment and treatment plan with the patient. The patient was provided an opportunity to ask questions and all were answered. The patient agreed with the plan and demonstrated an understanding of the instructions.   The  patient was advised to call back or seek an in-person evaluation if the symptoms worsen or if the condition fails to improve as anticipated.

## 2021-04-18 NOTE — Assessment & Plan Note (Signed)
COVID-19. Patient is 44, he had 3 COVID vaccines, he developed symptoms 3 days ago, tested + 2 days ago. Vital signs are stable, O2 sat in the 97% today.  He does not look toxic. Plan: Rest, fluids, Robitussin-DM. Options for treatment include:Paxlovid, MAB, others. I recommend Paxlovid and  he agrees. He will hold simvastatin, baclofen. Definitely watch for worsening symptoms or rebound after medication. To call if not gradually better ER if O2 sat consistently less than 94%.   Okay to proceed with a COVID booster  in 3 to 4 months

## 2021-04-18 NOTE — Telephone Encounter (Signed)
Nurse Assessment Nurse: Alveta Heimlich, RN, Rise Paganini Date/Time (Eastern Time): 04/18/2021 8:51:14 AM Confirm and document reason for call. If symptomatic, describe symptoms. ---Caller states he started Saturday night with symptoms, body aches, cough, chills, sore throat. His thermometer does not register any fever, but he sweats. He is wanting an antiviral or infusion. He tested positive on a home test yesterday. Does the patient have any new or worsening symptoms? ---Yes Will a triage be completed? ---Yes Related visit to physician within the last 2 weeks? ---No Does the PT have any chronic conditions? (i.e. diabetes, asthma, this includes High risk factors for pregnancy, etc.) ---Yes List chronic conditions. ---HTN, High cholesterol Is this a behavioral health or substance abuse call? ---No Guidelines Guideline Title Affirmed Question Affirmed Notes Nurse Date/Time ([place] Time) COVID-19 - Diagnosed or Suspected [1] HIGH RISK for severe COVID complications (e.g., weak immune system, age > 55 Alveta Heimlich, [place], Rise Paganini 04/18/2021 8:53:27 AM PLEASE NOTE: All timestamps contained within this report are represented as [place] Standard Time. CONFIDENTIALTY NOTICE: This fax transmission is intended only for the addressee. It contains information that is legally privileged, confidential or otherwise protected from use or disclosure. If you are not the intended recipient, you are strictly prohibited from reviewing, disclosing, copying using or disseminating any of this information or taking any action in reliance on or regarding this information. If you have received this fax in error, please notify us immediately by telephone so that we can arrange for its return to [place]. Phone: [phone], Toll-Free: [phone], Fax: [phone] Page: 2 of 2 Call Id: [id number] Guidelines Guideline Title Affirmed Question Affirmed Notes Nurse Date/Time Eilene Ghazi Time) years, obesity with BMI > 25, pregnant,  chronic lung disease or other chronic medical condition) AND [2] COVID symptoms (e.g., cough, fever) (Exceptions: Already seen by PCP and no new or worsening symptoms.) Disp. Time Eilene Ghazi Time) Disposition Final User 04/18/2021 8:56:38 AM See HCP within 4 Hours (or PCP triage) Yes Alveta Heimlich, RN, Rise Paganini Disposition Overriden: Call PCP Now Override Reason: Patient's symptoms need a higher level of care Caller Disagree/Comply Comply Caller Understands Yes PreDisposition Call Doctor Care Advice Given Per Guideline SEE HCP (OR PCP TRIAGE) WITHIN 4 HOURS: CALL BACK IF: * You become worse CARE ADVICE given per COVID-19 - DIAGNOSED OR SUSPECTED (Adult) guideline. Referrals REFERRED TO PCP OFFICE   Pt scheduled w/ PCP today.

## 2021-05-16 ENCOUNTER — Other Ambulatory Visit: Payer: Self-pay | Admitting: Physical Medicine & Rehabilitation

## 2021-07-17 ENCOUNTER — Encounter: Payer: Self-pay | Admitting: Internal Medicine

## 2021-07-17 ENCOUNTER — Other Ambulatory Visit: Payer: Self-pay

## 2021-07-17 ENCOUNTER — Ambulatory Visit: Payer: Commercial Managed Care - PPO | Attending: Internal Medicine

## 2021-07-17 ENCOUNTER — Ambulatory Visit: Payer: Commercial Managed Care - PPO | Admitting: Internal Medicine

## 2021-07-17 VITALS — BP 122/74 | HR 77 | Temp 98.6°F | Resp 16 | Ht 68.0 in | Wt 191.4 lb

## 2021-07-17 DIAGNOSIS — R739 Hyperglycemia, unspecified: Secondary | ICD-10-CM | POA: Diagnosis not present

## 2021-07-17 DIAGNOSIS — I10 Essential (primary) hypertension: Secondary | ICD-10-CM

## 2021-07-17 DIAGNOSIS — Z23 Encounter for immunization: Secondary | ICD-10-CM

## 2021-07-17 DIAGNOSIS — G801 Spastic diplegic cerebral palsy: Secondary | ICD-10-CM

## 2021-07-17 LAB — BASIC METABOLIC PANEL
BUN: 12 mg/dL (ref 6–23)
CO2: 26 mEq/L (ref 19–32)
Calcium: 9.3 mg/dL (ref 8.4–10.5)
Chloride: 104 mEq/L (ref 96–112)
Creatinine, Ser: 0.99 mg/dL (ref 0.40–1.50)
GFR: 84.54 mL/min (ref >60.00)
Glucose, Bld: 90 mg/dL (ref 70–99)
Potassium: 4.4 mEq/L (ref 3.5–5.1)
Sodium: 140 mEq/L (ref 135–145)

## 2021-07-17 NOTE — Progress Notes (Signed)
Subjective:    Patient ID: Adrian Schneider, male    DOB: 23-Apr-1966, 55 y.o.   MRN: [id number]  DOS:  07/17/2021 Type of visit - description: Routine visit  At the last visit he was Dx with COVID. He feels fully recuperated. Denies chest pain or difficulty breathing.  No palpitation.  No cough.  Hypertension: Good med compliance, ambulatory BP review is. MSK: continue w/ problems mostly at lower extremities   Review of Systems See above   Past Medical History:  Diagnosis Date   Allergy    dust mold   Cerebral palsy ([place])    dx at birth, has poor balance   Concussion 07/19/2016   fell down stair onto concrete   Hemorrhoids    Hyperlipidemia 6/11   started meds   Hypertension 12/2009   Left ankle pain    MRI in 06/2015 did not show specific internal derangement, had mild degenerative chondral thinning in the tibiotalar joint w/o a focal lesion   MVP (mitral valve prolapse)    dx years ago   Wart    L foot, non- healing lesion, f/u by derm    Past Surgical History:  Procedure Laterality Date   COLONOSCOPY  2016   INCISION AND DRAINAGE Left 08/01/2020   LEG SURGERY Bilateral    release muscles d/t "cerebral palsy"    Allergies as of 07/17/2021       Reactions   Dust Mite Extract    Mold Extract [trichophyton] Cough        Medication List        Accurate as of July 17, 2021  8:30 AM. If you have any questions, ask your nurse or doctor.          baclofen 10 MG tablet Commonly known as: LIORESAL TAKE 1/2 TABLET BY MOUTH THREE TIMES DAILY   losartan 50 MG tablet Commonly known as: COZAAR TAKE 1 TABLET(50 MG) BY MOUTH DAILY   sildenafil 20 MG tablet Commonly known as: REVATIO TAKE 3 TO 4 TABLETS BY MOUTH DAILY AS NEEDED   simvastatin 40 MG tablet Commonly known as: ZOCOR Take 1 tablet (40 mg total) by mouth daily.           Objective:   Physical Exam BP 122/74 (BP Location: Left Arm, Patient Position: Sitting, Cuff Size: Small)   Pulse  77   Temp 98.6 F (37 C) (Oral)   Resp 16   Ht '5\' 8"'$  (1.727 m)   Wt 191 lb 6 oz (86.8 kg)   SpO2 98%   BMI 29.10 kg/m  General:   Well developed, NAD, BMI noted. HEENT:  Normocephalic . Face symmetric, atraumatic Lungs:  CTA B Normal respiratory effort, no intercostal retractions, no accessory muscle use. Heart: RRR,  no murmur.  Lower extremities: no pretibial edema bilaterally  Skin: Not pale. Not jaundice Neurologic:  alert & oriented X3.  Speech normal, gait: At baseline Psych cognition and judgment appear intact.  Cooperative with normal attention span and concentration.  Behavior appropriate. No anxious or depressed appearing.      Assessment     Assessment  Prediabetes HTN Hyperlipidemia, 2011 Cerebral palsy,   poor balance DJD - sees [facility]o prn   Sees dermatology q year, h/o groin eczema E.D. Tinnitus ~ 11-2016, saw Dr. Cresenciano Lick COVID infection 03/2021  PLAN: Prediabetes: Used to do better with diet, counseled. HTN: Ambulatory BP over the last several weeks all within normal.  Continue losartan, check BMP Hyperlipidemia:  On simvastatin, well controlled. DJD-MSK: Last visit with Dr. Letta Pate 04/2020, today he complains of ankle pain, sharp, mostly with walking.  Recommend to see Dr. Kittie Plater again or see Ortho, he prefers to restart baclofen, go back to the gym and stretch. If no better he will let me know. CP: see above Preventive care recommend a COVID-vaccine booster and a flu shot RTC CPX 5 months   This visit occurred during the SARS-CoV-2 public health emergency.  Safety protocols were in place, including screening questions prior to the visit, additional usage of staff PPE, and extensive cleaning of exam room while observing appropriate contact time as indicated for disinfecting solutions.

## 2021-07-17 NOTE — Patient Instructions (Addendum)
Recommend to proceed with covid vaccine #4 at your pharmacy. Also recommend a flu shot this fall  Continue checking your blood pressures BP GOAL is between 110/65 and  135/85. If it is consistently higher or lower, let me know     GO TO THE LAB : Get the blood work     [place], [place] back for   a physical exam by 12-2021

## 2021-07-17 NOTE — Progress Notes (Signed)
   Covid-19 Vaccination Clinic  Name:  MAXEY COLBERG III    MRN: [id number] DOB: Oct 20, 1966  07/17/2021  Mr. Skorupski was observed post Covid-19 immunization for 15 minutes without incident. He was provided with Vaccine Information Sheet and instruction to access the V-Safe system.   Mr. Rairigh was instructed to call 911 with any severe reactions post vaccine: Difficulty breathing  Swelling of face and throat  A fast heartbeat  A bad rash all over body  Dizziness and weakness   Immunizations Administered     Name Date Dose VIS Date Route   PFIZER Comrnaty(Gray TOP) Covid-19 Vaccine 07/17/2021  9:04 AM 0.3 mL 10/27/2020 Intramuscular   Manufacturer: [place]   Lot: [id number]   [place]: [phone]

## 2021-07-18 NOTE — Assessment & Plan Note (Addendum)
Prediabetes: Used to do better with diet, counseled. HTN: Ambulatory BP over the last several weeks all within normal.  Continue losartan, check BMP Hyperlipidemia: On simvastatin, well controlled. DJD-MSK: Last visit with Dr. Letta Pate 04/2020, today he complains of ankle pain, sharp, mostly with walking.  Recommend to see Dr. Kittie Plater again or see Ortho, he prefers to restart baclofen, go back to the gym and stretch. If no better he will let me know. CP: see above Preventive care recommend a COVID-vaccine booster and a flu shot RTC CPX 5 months

## 2021-07-31 ENCOUNTER — Other Ambulatory Visit (HOSPITAL_BASED_OUTPATIENT_CLINIC_OR_DEPARTMENT_OTHER): Payer: Self-pay

## 2021-07-31 MED ORDER — COVID-19 MRNA VAC-TRIS(PFIZER) 30 MCG/0.3ML IM SUSP
INTRAMUSCULAR | 0 refills | Status: DC
  Filled 2021-07-31: qty 0.3, 1d supply, fill #0

## 2021-12-19 ENCOUNTER — Encounter: Payer: Self-pay | Admitting: Internal Medicine

## 2021-12-19 ENCOUNTER — Ambulatory Visit (INDEPENDENT_AMBULATORY_CARE_PROVIDER_SITE_OTHER): Payer: Commercial Managed Care - PPO | Admitting: Internal Medicine

## 2021-12-19 VITALS — BP 122/72 | HR 98 | Temp 98.6°F | Resp 16 | Ht 68.0 in | Wt 185.4 lb

## 2021-12-19 DIAGNOSIS — I10 Essential (primary) hypertension: Secondary | ICD-10-CM

## 2021-12-19 DIAGNOSIS — R739 Hyperglycemia, unspecified: Secondary | ICD-10-CM | POA: Diagnosis not present

## 2021-12-19 DIAGNOSIS — E785 Hyperlipidemia, unspecified: Secondary | ICD-10-CM

## 2021-12-19 DIAGNOSIS — Z125 Encounter for screening for malignant neoplasm of prostate: Secondary | ICD-10-CM | POA: Diagnosis not present

## 2021-12-19 DIAGNOSIS — Z0000 Encounter for general adult medical examination without abnormal findings: Secondary | ICD-10-CM

## 2021-12-19 LAB — CBC WITH DIFFERENTIAL/PLATELET
Basophils Absolute: 0 10*3/uL (ref 0.0–0.1)
Basophils Relative: 0.4 % (ref 0.0–3.0)
Eosinophils Absolute: 0.1 10*3/uL (ref 0.0–0.7)
Eosinophils Relative: 1.3 % (ref 0.0–5.0)
HCT: 44 % (ref 39.0–52.0)
Hemoglobin: 14.8 g/dL (ref 13.0–17.0)
Lymphocytes Relative: 27.6 % (ref 12.0–46.0)
Lymphs Abs: 1.6 10*3/uL (ref 0.7–4.0)
MCHC: 33.7 g/dL (ref 30.0–36.0)
MCV: 89.9 fl (ref 78.0–100.0)
Monocytes Absolute: 0.6 10*3/uL (ref 0.1–1.0)
Monocytes Relative: 10.3 % (ref 3.0–12.0)
Neutro Abs: 3.4 10*3/uL (ref 1.4–7.7)
Neutrophils Relative %: 60.4 % (ref 43.0–77.0)
Platelets: 313 10*3/uL (ref 150.0–400.0)
RBC: 4.9 Mil/uL (ref 4.22–5.81)
RDW: 14 % (ref 11.5–15.5)
WBC: 5.7 10*3/uL (ref 4.0–10.5)

## 2021-12-19 LAB — COMPREHENSIVE METABOLIC PANEL
ALT: 26 U/L (ref 0–53)
AST: 34 U/L (ref 0–37)
Albumin: 4.4 g/dL (ref 3.5–5.2)
Alkaline Phosphatase: 64 U/L (ref 39–117)
BUN: 17 mg/dL (ref 6–23)
CO2: 27 mEq/L (ref 19–32)
Calcium: 9.3 mg/dL (ref 8.4–10.5)
Chloride: 102 mEq/L (ref 96–112)
Creatinine, Ser: 0.92 mg/dL (ref 0.40–1.50)
GFR: 92.04 mL/min (ref >60.00)
Glucose, Bld: 79 mg/dL (ref 70–99)
Potassium: 4.2 mEq/L (ref 3.5–5.1)
Sodium: 138 mEq/L (ref 135–145)
Total Bilirubin: 1.1 mg/dL (ref 0.2–1.2)
Total Protein: 7.1 g/dL (ref 6.0–8.3)

## 2021-12-19 LAB — LIPID PANEL
Cholesterol: 161 mg/dL (ref 0–200)
HDL: 46.4 mg/dL (ref >39.00)
LDL Cholesterol: 103 mg/dL — ABNORMAL HIGH (ref 0–99)
NonHDL: 114.4
Total CHOL/HDL Ratio: 3
Triglycerides: 58 mg/dL (ref 0.0–149.0)
VLDL: 11.6 mg/dL (ref 0.0–40.0)

## 2021-12-19 LAB — PSA: PSA: 1.21 ng/mL (ref 0.10–4.00)

## 2021-12-19 LAB — TSH: TSH: 0.69 u[IU]/mL (ref 0.35–5.50)

## 2021-12-19 LAB — HEMOGLOBIN A1C: Hgb A1c MFr Bld: 5.9 % (ref 4.6–6.5)

## 2021-12-19 NOTE — Patient Instructions (Addendum)
Please consider using a cane or do physical therapy.  Is a good idea to go to the gym and start stretching again.  Proceed with a COVID-vaccine at your convenience    GO TO THE LAB : Get the blood work     [place], [place] back for a checkup in 6 months

## 2021-12-19 NOTE — Progress Notes (Signed)
Subjective:    Patient ID: Adrian Schneider, male    DOB: Mar 30, 1966, 56 y.o.   MRN: [id number]  DOS:  12/19/2021 Type of visit - description: cpx  Here for CPX. 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 Has frequent falls.  No major injuries so far.  Review of Systems  Other than above, a 14 point review of systems is negative      Past Medical History:  Diagnosis Date   Allergy    dust mold   Cerebral palsy ([place])    dx at birth, has poor balance   Concussion 07/19/2016   fell down stair onto concrete   Hemorrhoids    Hyperlipidemia 6/11   started meds   Hypertension 12/2009   Left ankle pain    MRI in 06/2015 did not show specific internal derangement, had mild degenerative chondral thinning in the tibiotalar joint w/o a focal lesion   MVP (mitral valve prolapse)    dx years ago   Wart    L foot, non- healing lesion, f/u by derm    Past Surgical History:  Procedure Laterality Date   COLONOSCOPY  2016   INCISION AND DRAINAGE Left 08/01/2020   LEG SURGERY Bilateral    release muscles d/t "cerebral palsy"   Social History   Socioeconomic History   Marital status: Single    Spouse name: Not on file   Number of children: 0   Years of education: Not on file   Highest education level: Not on file  Occupational History   Occupation: Chief Strategy Officer @ [employer]    Employer: [employer]  Tobacco Use   Smoking status: Never   Smokeless tobacco: Never  Vaping Use   Vaping Use: Never used  Substance and Sexual Activity   Alcohol use: Yes    Alcohol/week: 0.0 standard drinks    Comment: socially; weekly 1-2 beers weekly   Drug use: No   Sexual activity: Not on file  Other Topics Concern   Not on file  Social History Narrative   Single, no children, lives by himself   Social Determinants of Health   Financial Resource Strain: Not on file  Food Insecurity: Not on file  Transportation Needs: Not on file  Physical Activity: Not on file  Stress: Not on file  Social  Connections: Not on file  Intimate Partner Violence: Not on file    Current Outpatient Medications  Medication Instructions   baclofen (LIORESAL) 10 MG tablet TAKE 1/2 TABLET BY MOUTH THREE TIMES DAILY   losartan (COZAAR) 50 MG tablet TAKE 1 TABLET(50 MG) BY MOUTH DAILY   sildenafil (REVATIO) 20 MG tablet TAKE 3 TO 4 TABLETS BY MOUTH DAILY AS NEEDED   simvastatin (ZOCOR) 40 mg, Oral, Daily       Objective:   Physical Exam BP 122/72 (BP Location: Left Arm, Patient Position: Sitting, Cuff Size: Small)    Pulse 98    Temp 98.6 F (37 C) (Oral)    Resp 16    Ht 5\' 8"  (1.727 m)    Wt 185 lb 6 oz (84.1 kg)    SpO2 97%    BMI 28.19 kg/m  General: Well developed, NAD, BMI noted Neck: No  thyromegaly  HEENT:  Normocephalic . Face symmetric, atraumatic Lungs:  CTA B Normal respiratory effort, no intercostal retractions, no accessory muscle use. Heart: RRR,  no murmur.  Abdomen:  Not distended, soft, non-tender. No rebound or rigidity.   Lower extremities: no pretibial  edema bilaterally  Skin: Exposed areas without rash. Not pale. Not jaundice DRE: Normal sphincter tone, no stools, prostate normal Neurologic:  alert & oriented X3.  Speech normal, gait at baseline, unassisted, consistent with history of cerebral palsy. + Spasticity noted Psych: Cognition and judgment appear intact.  Cooperative with normal attention span and concentration.  Behavior appropriate. No anxious or depressed appearing.     Assessment    Assessment  Prediabetes HTN Hyperlipidemia, 2011 Cerebral palsy,   poor balance DJD - sees [facility]  ortho prn   Sees dermatology q year, h/o groin eczema E.D. Tinnitus ~ 11-2016, saw Dr. Cresenciano Lick COVID infection 03/2021  PLAN: Here for CPX Prediabetes: Check A1c. HTN on losartan, BP is very good.  Recommend to monitor BPs High cholesterol: On simvastatin, labs. Cerebral palsy, frequent falls. The patient typically drag his feet L>R, reports falls every few  weeks.  He thinks overall his gait and stability is worse due to lack of exercise and increased stiffness. Used to see physical medicine for injection of the left leg where he has shooting pains but he felt it was not helping much. Advised importance to prevent the next fall: PT?  Using a cane?  Patient declines, states that he plans to go back to the gym, stretch more. 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  Safety protocols were in place, including screening questions prior to the visit, additional usage of staff PPE, and extensive cleaning of exam room while observing appropriate contact time as indicated for disinfecting solutions.

## 2021-12-19 NOTE — Assessment & Plan Note (Signed)
-  Td 02-2015 - s/p Shingrix x 2 -COVID VAX: Last was 06-2021, rec bivalent shot - had a  flu shot   -Prostate cancer screening, DRE normal, no symptoms, check PSA -Colon cancer screening, Cscope 2016, C-scope 07-2020, next per GI -Labs:CMP, FLP, CBC, TSH, PSA, A1c -diet, exercise:  Discussed extensively again today. -ACP info  provided

## 2021-12-19 NOTE — Assessment & Plan Note (Signed)
Here for CPX Prediabetes: Check A1c. HTN on losartan, BP is very good.  Recommend to monitor BPs High cholesterol: On simvastatin, labs. Cerebral palsy, frequent falls. The patient typically drag his feet L>R, reports falls every few weeks.  He thinks overall his gait and stability is worse due to lack of exercise and increased stiffness. Used to see physical medicine for injection of the left leg where he has shooting pains but he felt it was not helping much. Advised importance to prevent the next fall: PT?  Using a cane?  Patient declines, states that he plans to go back to the gym, stretch more. 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

## 2021-12-28 ENCOUNTER — Encounter: Payer: Self-pay | Admitting: Internal Medicine

## 2022-01-19 ENCOUNTER — Encounter: Payer: Self-pay | Admitting: Family Medicine

## 2022-01-19 ENCOUNTER — Ambulatory Visit: Payer: Commercial Managed Care - PPO | Admitting: Family Medicine

## 2022-01-19 VITALS — BP 131/72 | HR 98 | Temp 98.5°F | Ht 68.0 in | Wt 180.2 lb

## 2022-01-19 DIAGNOSIS — J988 Other specified respiratory disorders: Secondary | ICD-10-CM

## 2022-01-19 DIAGNOSIS — R051 Acute cough: Secondary | ICD-10-CM

## 2022-01-19 MED ORDER — ALBUTEROL SULFATE HFA 108 (90 BASE) MCG/ACT IN AERS: 2.0000 | INHALATION_SPRAY | Freq: Four times a day (QID) | RESPIRATORY_TRACT | 11 refills | Status: DC | PRN

## 2022-01-19 MED ORDER — AZITHROMYCIN 250 MG PO TABS: ORAL_TABLET | ORAL | 0 refills | Status: DC

## 2022-01-19 MED ORDER — PREDNISONE 20 MG PO TABS: 40.0000 mg | ORAL_TABLET | Freq: Every day | ORAL | 0 refills | Status: AC

## 2022-01-19 NOTE — Progress Notes (Signed)
? ?Acute Office Visit ? ?Subjective:  ? ? Patient ID: Adrian Schneider, male    DOB: 1966-02-09, 56 y.o.   MRN: [id number] ? ?CC: cough ? ? ?HPI ?Patient is in today for cough. ? ?Cough started yesterday - barking, deep, mild fatigue; no dyspnea at rest, wheezing/stridor, fevers, headaches, sinus pressure, rhinorrhea, nasal congestion, sore throat. He had a negative COVID test this morning. States he has been around his sisters recently while taking care of their dad who is in hospice care. Both sisters developed similar symptoms and one just got diagnosed with croup. He is wanting to be proactive so he doesn't infect his elderly mother.  ? ? ? ?Past Medical History:  ?Diagnosis Date  ? Allergy   ? dust mold  ? Cerebral palsy ([place])   ? dx at birth, has poor balance  ? Concussion 07/19/2016  ? fell down stair onto concrete  ? Hemorrhoids   ? Hyperlipidemia 6/11  ? started meds  ? Hypertension 12/2009  ? Left ankle pain   ? MRI in 06/2015 did not show specific internal derangement, had mild degenerative chondral thinning in the tibiotalar joint w/o a focal lesion  ? MVP (mitral valve prolapse)   ? dx years ago  ? Wart   ? L foot, non- healing lesion, f/u by derm  ? ? ?Past Surgical History:  ?Procedure Laterality Date  ? COLONOSCOPY  2016  ? INCISION AND DRAINAGE Left 08/01/2020  ? LEG SURGERY Bilateral   ? release muscles d/t "cerebral palsy"  ? ? ?Family History  ?Problem Relation Age of Onset  ? Coronary artery disease Mother   ?     onset?  ? Hypertension Mother   ? Skin cancer Mother   ? Hypertension Father   ? Diabetes Father   ? Heart failure Father   ? Colon polyps Sister   ? Coronary artery disease Maternal Grandmother   ? Lung cancer Other   ?     aunt, uncle , smokers   ? Breast cancer Other   ?     cousins  ? Stroke Neg Hx   ? Colon cancer Neg Hx   ? Prostate cancer Neg Hx   ? Esophageal cancer Neg Hx   ? Stomach cancer Neg Hx   ? Rectal cancer Neg Hx   ? ? ?Social History  ? ?Socioeconomic History  ? Marital  status: Single  ?  Spouse name: Not on file  ? Number of children: 0  ? Years of education: Not on file  ? Highest education level: Not on file  ?Occupational History  ? 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  Employer: [employer]  ?Tobacco Use  ? Smoking status: Never  ? Smokeless tobacco: Never  ?Vaping Use  ? Vaping Use: Never used  ?Substance and Sexual Activity  ? Alcohol use: Yes  ?  Alcohol/week: 0.0 standard drinks  ?  Comment: socially; weekly 1-2 beers weekly  ? Drug use: No  ? Sexual activity: Not on file  ?Other Topics Concern  ? Not on file  ?Social History Narrative  ? Single, no children, lives by himself  ? ?Social Determinants of Health  ? ?Financial Resource Strain: Not on file  ?Food Insecurity: Not on file  ?Transportation Needs: Not on file  ?Physical Activity: Not on file  ?Stress: Not on file  ?Social Connections: Not on file  ?Intimate Partner Violence: Not on file  ? ? ?Outpatient Medications Prior  to Visit  ?Medication Sig Dispense Refill  ? baclofen (LIORESAL) 10 MG tablet TAKE 1/2 TABLET BY MOUTH THREE TIMES DAILY (Patient not taking: Reported on 07/17/2021) 135 tablet 1  ? losartan (COZAAR) 50 MG tablet TAKE 1 TABLET(50 MG) BY MOUTH DAILY 90 tablet 1  ? simvastatin (ZOCOR) 40 MG tablet Take 1 tablet (40 mg total) by mouth daily. 90 tablet 1  ? sildenafil (REVATIO) 20 MG tablet TAKE 3 TO 4 TABLETS BY MOUTH DAILY AS NEEDED (Patient not taking: Reported on 04/18/2021) 30 tablet 3  ? ?No facility-administered medications prior to visit.  ? ? ?Allergies  ?Allergen Reactions  ? Dust Mite Extract   ? Mold Extract [Trichophyton] Cough  ? ? ?Review of Systems ?All review of systems negative except what is listed in the HPI ? ?   ?Objective:  ?  ?Physical Exam ?Vitals reviewed.  ?Constitutional:   ?   Appearance: Normal appearance.  ?HENT:  ?   Head: Normocephalic and atraumatic.  ?   Nose: Nose normal.  ?   Mouth/Throat:  ?   Mouth: Mucous membranes are moist.  ?   Pharynx: Oropharynx is clear. No  oropharyngeal exudate or posterior oropharyngeal erythema.  ?Cardiovascular:  ?   Rate and Rhythm: Normal rate and regular rhythm.  ?Pulmonary:  ?   Effort: Pulmonary effort is normal. No respiratory distress.  ?   Breath sounds: Normal breath sounds. No stridor. No wheezing or rales.  ?   Comments: Deep, barking cough throughout visit ?Neurological:  ?   General: No focal deficit present.  ?   Mental Status: He is alert and oriented to person, place, and time. Mental status is at baseline.  ?Psychiatric:     ?   Mood and Affect: Mood normal.     ?   Behavior: Behavior normal.     ?   Thought Content: Thought content normal.     ?   Judgment: Judgment normal.  ? ? ?BP 131/72   Pulse 98   Temp 98.5 ?F (36.9 ?C)   Ht 5\' 8"  (1.727 m)   Wt 180 lb 3.2 oz (81.7 kg)   SpO2 98%   BMI 27.40 kg/m?  ?Wt Readings from Last 3 Encounters:  ?01/19/22 180 lb 3.2 oz (81.7 kg)  ?12/19/21 185 lb 6 oz (84.1 kg)  ?07/17/21 191 lb 6 oz (86.8 kg)  ? ? ?Health Maintenance Due  ?Topic Date Due  ? COVID-19 Vaccine (5 - Booster for Pfizer series) 09/11/2021  ? ? ?There are no preventive care reminders to display for this patient. ? ? ?Lab Results  ?Component Value Date  ? TSH 0.69 12/19/2021  ? ?Lab Results  ?Component Value Date  ? WBC 5.7 12/19/2021  ? HGB 14.8 12/19/2021  ? HCT 44.0 12/19/2021  ? MCV 89.9 12/19/2021  ? PLT 313.0 12/19/2021  ? ?Lab Results  ?Component Value Date  ? NA 138 12/19/2021  ? K 4.2 12/19/2021  ? CO2 27 12/19/2021  ? GLUCOSE 79 12/19/2021  ? BUN 17 12/19/2021  ? CREATININE 0.92 12/19/2021  ? BILITOT 1.1 12/19/2021  ? ALKPHOS 64 12/19/2021  ? AST 34 12/19/2021  ? ALT 26 12/19/2021  ? PROT 7.1 12/19/2021  ? ALBUMIN 4.4 12/19/2021  ? CALCIUM 9.3 12/19/2021  ? GFR 92.04 12/19/2021  ? ?Lab Results  ?Component Value Date  ? CHOL 161 12/19/2021  ? ?Lab Results  ?Component Value Date  ? HDL 46.40 12/19/2021  ? ?Lab Results  ?Component  Value Date  ? LDLCALC 103 (H) 12/19/2021  ? ?Lab Results  ?Component Value Date  ?  TRIG 58.0 12/19/2021  ? ?Lab Results  ?Component Value Date  ? CHOLHDL 3 12/19/2021  ? ?Lab Results  ?Component Value Date  ? HGBA1C 5.9 12/19/2021  ? ? ?   ?Assessment & Plan:  ? ? ?1. Respiratory infection ?2. Acute cough ?Barking cough is suspicious for croup, though less common in adults. ?Best treatment for this is steroids, hydration, rest. I am sending in oral prednisone to take daily for the next 5 days and an albuterol inhaler to use as needed for coughing spells, wheezing, shortness of breath, etc.  ?Given your circumstances (busy taking care of dad in hospice), I will send in a Zpak for you to have on hand, but I do not think this will be necessary - try to avoid unless your symptoms changes (productive cough, fevers, sinus pressure, not improving with steroids and conservative measures, etc).  ? ? ?- predniSONE (DELTASONE) 20 MG tablet; Take 2 tablets (40 mg total) by mouth daily with breakfast for 5 days.  Dispense: 10 tablet; Refill: 0 ?- albuterol (VENTOLIN HFA) 108 (90 Base) MCG/ACT inhaler; Inhale 2 puffs into the lungs every 6 (six) hours as needed for wheezing.  Dispense: 2 each; Refill: 11 ?- azithromycin (ZITHROMAX Z-PAK) 250 MG tablet; Take 2 tablets (500 mg) on  Day 1,  followed by 1 tablet (250 mg) once daily on Days 2 through 5.  Dispense: 6 tablet; Refill: 0 ? ? ?Please contact office for follow-up if symptoms do not improve or worsen. Seek emergency care if symptoms become severe. ? ? ? ?Terrilyn Saver, NP ? ?

## 2022-01-19 NOTE — Patient Instructions (Addendum)
Barking cough is suspicious for croup, though less common in adults. ?Best treatment for this is steroids, hydration, rest. I am sending in oral prednisone to take daily for the next 5 days and an albuterol inhaler to use as needed for coughing spells, wheezing, shortness of breath, etc.  ?Given your circumstances, I will send in a Zpak for you to have on hand, but I do not think this will be necessary - try to avoid unless your symptoms changes (productive cough, fevers, sinus pressure, not improving with steroids and conservative measures, etc).  ? ?Please contact office for follow-up if symptoms do not improve or worsen. Seek emergency care if symptoms become severe. ? ?

## 2022-01-22 ENCOUNTER — Telehealth: Payer: Self-pay | Admitting: Internal Medicine

## 2022-01-22 DIAGNOSIS — R051 Acute cough: Secondary | ICD-10-CM

## 2022-01-22 NOTE — Telephone Encounter (Signed)
Pt advised to start abx. ?

## 2022-01-22 NOTE — Telephone Encounter (Signed)
Pt was seen with Adrian Schneider 3/3 for coughing & was prescribed Z-pack if things got worse  ? ?Pt would like to know if it's okay to start the meds now. Pt's symps high fever & coughing  ? ?Please advise  ?

## 2022-01-24 MED ORDER — HYDROCOD POLI-CHLORPHE POLI ER 10-8 MG/5ML PO SUER: 5.0000 mL | Freq: Two times a day (BID) | ORAL | 0 refills | Status: AC | PRN

## 2022-01-24 MED ORDER — BENZONATATE 200 MG PO CAPS: 200.0000 mg | ORAL_CAPSULE | Freq: Two times a day (BID) | ORAL | 0 refills | Status: DC | PRN

## 2022-01-24 NOTE — Telephone Encounter (Signed)
Patient states he still does not feel well and made an OV with [facility] tomorrow. He would like to know if he can get something for his cough that is a bit stronger to help him out until tomorrow. Please advise.  ?

## 2022-01-24 NOTE — Addendum Note (Signed)
Addended by: Caleen Jobs B on: 01/24/2022 04:43 PM ? ? Modules accepted: Orders ? ?

## 2022-01-25 ENCOUNTER — Ambulatory Visit (INDEPENDENT_AMBULATORY_CARE_PROVIDER_SITE_OTHER): Payer: Commercial Managed Care - PPO | Admitting: Family Medicine

## 2022-01-25 ENCOUNTER — Encounter: Payer: Self-pay | Admitting: Family Medicine

## 2022-01-25 VITALS — BP 119/79 | HR 112 | Ht 68.0 in | Wt 184.6 lb

## 2022-01-25 DIAGNOSIS — J988 Other specified respiratory disorders: Secondary | ICD-10-CM | POA: Diagnosis not present

## 2022-01-25 NOTE — Patient Instructions (Addendum)
Finish Zpak ?Continue Allegra, start Flonase ?Continue humidifier use ?Start cough medicine that was sent in to pharmacy ? ?Please contact office for follow-up if symptoms do not improve or worsen. Seek emergency care if symptoms become severe. ? ?

## 2022-01-25 NOTE — Progress Notes (Signed)
? ?Acute Office Visit ? ?Subjective:  ? ? Patient ID: Adrian Schneider, male    DOB: 05/30/1966, 56 y.o.   MRN: [id number] ? ?CC: cough f/u  ? ? ?HPI ?Patient is in today for cough f/u. ? ?Patient was treated for upper respiratory infection last week with croup type cough.  He was initially started on steroids and an inhaler.  He started running a fever several days later and went ahead and took his Z-Pak which had been prescribed for " watch and wait."  States he had not noticed much improvement up until yesterday so he scheduled this appointment.  His sent in a MyChart message and benzonatate and Tussionex were sent to him late last night but he has not yet had time to pick them up. ? ?States last night he started taking Allegra and went humidifier which she states helped him sleep significantly better.  Previously he has been up for the past 3 nights due to coughing despite changing positions.  Reports his mucus has started to become more clear and still feels primarily in his upper airways.  Cough does not sound as deep as it was before.  He is feeling significantly better today compared to the past few days and thinks last night helped him " around the corner."  He is planning to pick up his cough medicine prescriptions today and finish out the Z-Pak.  He will be flying over the weekend for his dad's funeral. ? ? ? ? ?Past Medical History:  ?Diagnosis Date  ? Allergy   ? dust mold  ? Cerebral palsy ([place])   ? dx at birth, has poor balance  ? Concussion 07/19/2016  ? fell down stair onto concrete  ? Hemorrhoids   ? Hyperlipidemia 6/11  ? started meds  ? Hypertension 12/2009  ? Left ankle pain   ? MRI in 06/2015 did not show specific internal derangement, had mild degenerative chondral thinning in the tibiotalar joint w/o a focal lesion  ? MVP (mitral valve prolapse)   ? dx years ago  ? Wart   ? L foot, non- healing lesion, f/u by derm  ? ? ?Past Surgical History:  ?Procedure Laterality Date  ? COLONOSCOPY  2016  ?  INCISION AND DRAINAGE Left 08/01/2020  ? LEG SURGERY Bilateral   ? release muscles d/t "cerebral palsy"  ? ? ?Family History  ?Problem Relation Age of Onset  ? Coronary artery disease Mother   ?     onset?  ? Hypertension Mother   ? Skin cancer Mother   ? Hypertension Father   ? Diabetes Father   ? Heart failure Father   ? Colon polyps Sister   ? Coronary artery disease Maternal Grandmother   ? Lung cancer Other   ?     aunt, uncle , smokers   ? Breast cancer Other   ?     cousins  ? Stroke Neg Hx   ? Colon cancer Neg Hx   ? Prostate cancer Neg Hx   ? Esophageal cancer Neg Hx   ? Stomach cancer Neg Hx   ? Rectal cancer Neg Hx   ? ? ?Social History  ? ?Socioeconomic History  ? Marital status: Single  ?  Spouse name: Not on file  ? Number of children: 0  ? Years of education: Not on file  ? Highest education level: Not on file  ?Occupational History  ? 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  Employer:  [employer]  ?Tobacco Use  ? Smoking status: Never  ? Smokeless tobacco: Never  ?Vaping Use  ? Vaping Use: Never used  ?Substance and Sexual Activity  ? Alcohol use: Yes  ?  Alcohol/week: 0.0 standard drinks  ?  Comment: socially; weekly 1-2 beers weekly  ? Drug use: No  ? Sexual activity: Not on file  ?Other Topics Concern  ? Not on file  ?Social History Narrative  ? Single, no children, lives by himself  ? ?Social Determinants of Health  ? ?Financial Resource Strain: Not on file  ?Food Insecurity: Not on file  ?Transportation Needs: Not on file  ?Physical Activity: Not on file  ?Stress: Not on file  ?Social Connections: Not on file  ?Intimate Partner Violence: Not on file  ? ? ?Outpatient Medications Prior to Visit  ?Medication Sig Dispense Refill  ? albuterol (VENTOLIN HFA) 108 (90 Base) MCG/ACT inhaler Inhale 2 puffs into the lungs every 6 (six) hours as needed for wheezing. 2 each 11  ? baclofen (LIORESAL) 10 MG tablet TAKE 1/2 TABLET BY MOUTH THREE TIMES DAILY 135 tablet 1  ? benzonatate (TESSALON) 200 MG capsule Take 1  capsule (200 mg total) by mouth 2 (two) times daily as needed for cough. 20 capsule 0  ? chlorpheniramine-HYDROcodone 10-8 MG/5ML Take 5 mLs by mouth every 12 (twelve) hours as needed for up to 5 days for cough (cough, will cause drowsiness.). 50 mL 0  ? losartan (COZAAR) 50 MG tablet TAKE 1 TABLET(50 MG) BY MOUTH DAILY 90 tablet 1  ? simvastatin (ZOCOR) 40 MG tablet Take 1 tablet (40 mg total) by mouth daily. 90 tablet 1  ? azithromycin (ZITHROMAX Z-PAK) 250 MG tablet Take 2 tablets (500 mg) on  Day 1,  followed by 1 tablet (250 mg) once daily on Days 2 through 5. 6 tablet 0  ? ?No facility-administered medications prior to visit.  ? ? ?Allergies  ?Allergen Reactions  ? Dust Mite Extract   ? Mold Extract [Trichophyton] Cough  ? ? ?Review of Systems ?All review of systems negative except what is listed in the HPI ? ?   ?Objective:  ?  ?Physical Exam ?Vitals reviewed.  ?Constitutional:   ?   General: He is not in acute distress. ?   Appearance: Normal appearance. He is not ill-appearing.  ?Cardiovascular:  ?   Rate and Rhythm: Normal rate and regular rhythm.  ?Pulmonary:  ?   Effort: Pulmonary effort is normal. No respiratory distress.  ?   Breath sounds: Normal breath sounds. No stridor. No wheezing, rhonchi or rales.  ?Neurological:  ?   General: No focal deficit present.  ?   Mental Status: He is alert and oriented to person, place, and time. Mental status is at baseline.  ?Psychiatric:     ?   Mood and Affect: Mood normal.     ?   Behavior: Behavior normal.     ?   Thought Content: Thought content normal.     ?   Judgment: Judgment normal.  ? ? ?BP 119/79   Pulse (!) 112   Ht '5\' 8"'$  (1.727 m)   Wt 184 lb 9.6 oz (83.7 kg)   SpO2 98%   BMI 28.07 kg/m?  ?Wt Readings from Last 3 Encounters:  ?01/25/22 184 lb 9.6 oz (83.7 kg)  ?01/19/22 180 lb 3.2 oz (81.7 kg)  ?12/19/21 185 lb 6 oz (84.1 kg)  ? ? ?Health Maintenance Due  ?Topic Date Due  ? COVID-19  Vaccine (5 - Booster for Pfizer series) 09/11/2021  ? ? ?There  are no preventive care reminders to display for this patient. ? ? ?Lab Results  ?Component Value Date  ? TSH 0.69 12/19/2021  ? ?Lab Results  ?Component Value Date  ? WBC 5.7 12/19/2021  ? HGB 14.8 12/19/2021  ? HCT 44.0 12/19/2021  ? MCV 89.9 12/19/2021  ? PLT 313.0 12/19/2021  ? ?Lab Results  ?Component Value Date  ? NA 138 12/19/2021  ? K 4.2 12/19/2021  ? CO2 27 12/19/2021  ? GLUCOSE 79 12/19/2021  ? BUN 17 12/19/2021  ? CREATININE 0.92 12/19/2021  ? BILITOT 1.1 12/19/2021  ? ALKPHOS 64 12/19/2021  ? AST 34 12/19/2021  ? ALT 26 12/19/2021  ? PROT 7.1 12/19/2021  ? ALBUMIN 4.4 12/19/2021  ? CALCIUM 9.3 12/19/2021  ? GFR 92.04 12/19/2021  ? ?Lab Results  ?Component Value Date  ? CHOL 161 12/19/2021  ? ?Lab Results  ?Component Value Date  ? HDL 46.40 12/19/2021  ? ?Lab Results  ?Component Value Date  ? LDLCALC 103 (H) 12/19/2021  ? ?Lab Results  ?Component Value Date  ? TRIG 58.0 12/19/2021  ? ?Lab Results  ?Component Value Date  ? CHOLHDL 3 12/19/2021  ? ?Lab Results  ?Component Value Date  ? HGBA1C 5.9 12/19/2021  ? ? ?   ?Assessment & Plan:  ? ?1. Respiratory infection ?Finish Zpak. Making progress. No new symptoms.  ?Continue Allegra, start Flonase ?Continue humidifier use ?Start cough medicine that was sent in to pharmacy ?Patient aware of signs/symptoms requiring further/urgent evaluation. ? ? ?Please contact office for follow-up if symptoms do not improve or worsen. Seek emergency care if symptoms become severe. ? ? ?Terrilyn Saver, NP ? ?

## 2022-02-09 ENCOUNTER — Other Ambulatory Visit: Payer: Self-pay | Admitting: Internal Medicine

## 2022-04-10 ENCOUNTER — Other Ambulatory Visit: Payer: Self-pay | Admitting: Internal Medicine

## 2022-06-18 ENCOUNTER — Ambulatory Visit: Payer: Commercial Managed Care - PPO | Admitting: Internal Medicine

## 2022-06-18 ENCOUNTER — Encounter: Payer: Self-pay | Admitting: Internal Medicine

## 2022-06-18 VITALS — BP 126/80 | HR 73 | Temp 98.2°F | Resp 16 | Ht 68.0 in | Wt 187.0 lb

## 2022-06-18 DIAGNOSIS — I10 Essential (primary) hypertension: Secondary | ICD-10-CM

## 2022-06-18 DIAGNOSIS — R739 Hyperglycemia, unspecified: Secondary | ICD-10-CM | POA: Diagnosis not present

## 2022-06-18 DIAGNOSIS — E785 Hyperlipidemia, unspecified: Secondary | ICD-10-CM

## 2022-06-18 LAB — LIPID PANEL
Cholesterol: 164 mg/dL (ref 0–200)
HDL: 45.1 mg/dL (ref >39.00)
LDL Cholesterol: 101 mg/dL — ABNORMAL HIGH (ref 0–99)
NonHDL: 118.55
Total CHOL/HDL Ratio: 4
Triglycerides: 90 mg/dL (ref 0.0–149.0)
VLDL: 18 mg/dL (ref 0.0–40.0)

## 2022-06-18 LAB — BASIC METABOLIC PANEL WITH GFR
BUN: 13 mg/dL (ref 6–23)
CO2: 25 meq/L (ref 19–32)
Calcium: 9.2 mg/dL (ref 8.4–10.5)
Chloride: 103 meq/L (ref 96–112)
Creatinine, Ser: 0.91 mg/dL (ref 0.40–1.50)
GFR: 92.93 mL/min
Glucose, Bld: 92 mg/dL (ref 70–99)
Potassium: 4.2 meq/L (ref 3.5–5.1)
Sodium: 138 meq/L (ref 135–145)

## 2022-06-18 NOTE — Progress Notes (Signed)
   Subjective:    Patient ID: Adrian Schneider, male    DOB: 1966/02/01, 56 y.o.   MRN: [id number]  DOS:  06/18/2022 Type of visit - description: f/u  Since the last visit, lost his father, emotionally doing well. No other major events  Review of Systems See above   Past Medical History:  Diagnosis Date   Allergy    dust mold   Cerebral palsy ([place])    dx at birth, has poor balance   Concussion 07/19/2016   fell down stair onto concrete   Hemorrhoids    Hyperlipidemia 6/11   started meds   Hypertension 12/2009   Left ankle pain    MRI in 06/2015 did not show specific internal derangement, had mild degenerative chondral thinning in the tibiotalar joint w/o a focal lesion   MVP (mitral valve prolapse)    dx years ago   Wart    L foot, non- healing lesion, f/u by derm    Past Surgical History:  Procedure Laterality Date   COLONOSCOPY  2016   INCISION AND DRAINAGE Left 08/01/2020   LEG SURGERY Bilateral    release muscles d/t "cerebral palsy"    Current Outpatient Medications  Medication Instructions   baclofen (LIORESAL) 10 MG tablet TAKE 1/2 TABLET BY MOUTH THREE TIMES DAILY   losartan (COZAAR) 50 mg, Oral, Daily   simvastatin (ZOCOR) 40 MG tablet TAKE 1 TABLET BY MOUTH DAILY       Objective:   Physical Exam BP 126/80   Pulse 73   Temp 98.2 F (36.8 C) (Oral)   Resp 16   Ht '5\' 8"'$  (1.727 m)   Wt 187 lb (84.8 kg)   SpO2 97%   BMI 28.43 kg/m  General:   Well developed, NAD, BMI noted. HEENT:  Normocephalic . Face symmetric, atraumatic Lungs:  CTA B Normal respiratory effort, no intercostal retractions, no accessory muscle use. Heart: RRR,  no murmur.  Lower extremities: no pretibial edema bilaterally  Skin: Not pale. Not jaundice Neurologic:  alert & oriented X3.  Speech normal, gait: Consistent with cerebral palsy, at baseline. Psych--  Cognition and judgment appear intact.  Cooperative with normal attention span and concentration.  Behavior  appropriate. No anxious or depressed appearing.      Assessment     Assessment  Prediabetes HTN Hyperlipidemia, 2011 Cerebral palsy,   poor balance DJD - sees [facility]  ortho prn   Sees dermatology q year, h/o groin eczema E.D. Tinnitus ~ 11-2016, saw Dr. Cresenciano Lick COVID infection 03/2021  PLAN: Here for routine checkup Prediabetes: Last A1c okay. HTN:BP today is very good, continue losartan High cholesterol: Diet has not been good in the last few months, on simvastatin, would like to check his cholesterol.  Will do. Preventive care: Encouraged to get a COVID booster and a flu shot this RTC 6 months CPX

## 2022-06-18 NOTE — Patient Instructions (Addendum)
Recommend to proceed with covid booster (bivalent) at your pharmacy.  Flu shot this fall.    Check the  blood pressure regularly BP GOAL is between 110/65 and  135/85. If it is consistently higher or lower, let me know   GO TO THE LAB : Get the blood work     Adrian Schneider, Adrian Schneider back for a physical by 11-2022

## 2022-06-18 NOTE — Assessment & Plan Note (Signed)
Here for routine checkup Prediabetes: Last A1c okay. HTN:BP today is very good, continue losartan High cholesterol: Diet has not been good in the last few months, on simvastatin, would like to check his cholesterol.  Will do. Preventive care: Encouraged to get a COVID booster and a flu shot this RTC 6 months CPX

## 2022-08-09 ENCOUNTER — Encounter: Payer: Self-pay | Admitting: Family Medicine

## 2022-08-09 ENCOUNTER — Ambulatory Visit: Payer: Commercial Managed Care - PPO | Admitting: Family Medicine

## 2022-08-09 VITALS — BP 122/70 | HR 71 | Temp 98.1°F | Ht 68.0 in | Wt 189.0 lb

## 2022-08-09 DIAGNOSIS — M545 Low back pain, unspecified: Secondary | ICD-10-CM | POA: Diagnosis not present

## 2022-08-09 MED ORDER — MELOXICAM 15 MG PO TABS: 15.0000 mg | ORAL_TABLET | Freq: Every day | ORAL | 0 refills | Status: DC

## 2022-08-09 MED ORDER — TIZANIDINE HCL 4 MG PO TABS: 4.0000 mg | ORAL_TABLET | Freq: Four times a day (QID) | ORAL | 0 refills | Status: DC | PRN

## 2022-08-09 NOTE — Patient Instructions (Signed)

## 2022-08-09 NOTE — Progress Notes (Signed)
Musculoskeletal Exam  Patient: Adrian Schneider DOB: 05-20-1966  DOS: 08/09/2022  SUBJECTIVE:  Chief Complaint:   Chief Complaint  Patient presents with   lower back pain     Going on for 2 weeks.  Yesterday was worse     Adrian TERNES Schneider is a 56 y.o.  male for evaluation and treatment of back pain.   Onset:  2 weeks ago. May have sat too long.  Location: lower R Character:  sharp and shooting  Progression of issue:  was getting better and then spiked again Associated symptoms: radiates down RLE Denies bowel/bladder incontinence or weakness Treatment: to date has been OTC NSAIDS.   Neurovascular symptoms: no  Past Medical History:  Diagnosis Date   Allergy    dust mold   Cerebral palsy ([place])    dx at birth, has poor balance   Concussion 07/19/2016   fell down stair onto concrete   Hemorrhoids    Hyperlipidemia 6/11   started meds   Hypertension 12/2009   Left ankle pain    MRI in 06/2015 did not show specific internal derangement, had mild degenerative chondral thinning in the tibiotalar joint w/o a focal lesion   MVP (mitral valve prolapse)    dx years ago   Wart    L foot, non- healing lesion, f/u by derm    Objective:  VITAL SIGNS: BP 122/70 (BP Location: Left Arm, Cuff Size: Large)   Pulse 71   Temp 98.1 F (36.7 C) (Oral)   Ht '5\' 8"'$  (1.727 m)   Wt 189 lb (85.7 kg)   SpO2 98%   BMI 28.74 kg/m  Constitutional: Well formed, well developed. No acute distress. HENT: Normocephalic, atraumatic.  Thorax & Lungs:  No accessory muscle use Musculoskeletal: low back.   Tenderness to palpation: mild ttp over SI jt on R, lumbar paras msc and prox glute.  Deformity: no Ecchymosis: no Straight leg test: negative for Poor hamstring flexibility b/l. Neurologic: Normal sensory function. No focal deficits noted. DTR's equal and symmetric in LE's. 5/5 strength throughout LE's.  Psychiatric: Normal mood. Age appropriate judgment and insight. Alert & oriented x 3.     Assessment:  Acute right-sided low back pain without sciatica - Plan: meloxicam (MOBIC) 15 MG tablet, tiZANidine (ZANAFLEX) 4 MG tablet  Plan: Stretches/exercises, heat, ice, Tylenol, NSAIDs, non-sedating msc relaxer. PT if no better. Pt very tight, hx of CP makes things more tight at baseline.  F/u prn. The patient voiced understanding and agreement to the plan.   [place], DO 08/09/22  11:51 AM

## 2022-09-21 ENCOUNTER — Encounter: Payer: Self-pay | Admitting: Physical Medicine & Rehabilitation

## 2022-09-21 ENCOUNTER — Encounter
Payer: Commercial Managed Care - PPO | Attending: Physical Medicine & Rehabilitation | Admitting: Physical Medicine & Rehabilitation

## 2022-09-21 VITALS — BP 124/72 | HR 81 | Ht 68.0 in | Wt 191.0 lb

## 2022-09-21 DIAGNOSIS — G801 Spastic diplegic cerebral palsy: Secondary | ICD-10-CM | POA: Insufficient documentation

## 2022-09-21 DIAGNOSIS — M7711 Lateral epicondylitis, right elbow: Secondary | ICD-10-CM | POA: Insufficient documentation

## 2022-09-21 MED ORDER — BACLOFEN 10 MG PO TABS: ORAL_TABLET | ORAL | 1 refills | Status: DC

## 2022-09-21 NOTE — Patient Instructions (Signed)
Tennis Elbow Rehab- take Meloxicam '15mg'$  with food daily until done,  May use voltaren gel on RIght elbow 3-4 times daily  Use tennis elbow splint  Ask your health care provider which exercises are safe for you. Do exercises exactly as told by your health care provider and adjust them as directed. It is normal to feel mild stretching, pulling, tightness, or discomfort as you do these exercises. Stop right away if you feel sudden pain or your pain gets worse. Do not begin these exercises until told by your health care provider. Stretching and range-of-motion exercises These exercises warm up your muscles and joints and improve the movement and flexibility of your elbow. Wrist flexion, assisted  Straighten your left / right elbow in front of you with your palm facing down toward the floor. If told by your health care provider, bend your left / right elbow to a 90-degree angle (right angle) at your side instead of holding it straight. With your other hand, gently push over the back of your left / right hand so your fingers point toward the floor (flexion). Stop when you feel a gentle stretch on the back of your forearm. Hold this position for __________ seconds. Repeat __________ times. Complete this exercise __________ times a day. Wrist extension, assisted  Straighten your left / right elbow in front of you with your palm facing up toward the ceiling. If told by your health care provider, bend your left / right elbow to a 90-degree angle (right angle) at your side instead of holding it straight. With your other hand, gently pull your left / right hand and fingers toward the floor (extension). Stop when you feel a gentle stretch on the palm side of your forearm. Hold this position for __________ seconds. Repeat __________ times. Complete this exercise __________ times a day. Assisted forearm rotation, supination Sit or stand with your elbows at your side. Bend your left / right elbow to a 90-degree  angle (right angle). Using your uninjured hand, turn your left / right palm up toward the ceiling (supination) until you feel a gentle stretch along the inside of your forearm. Hold this position for __________ seconds. Repeat __________ times. Complete this exercise __________ times a day. Assisted forearm rotation, pronation Sit or stand with your elbows at your side. Bend your left / right elbow to a 90-degree angle (right angle). Using your uninjured hand, turn your left / right palm down toward the floor (pronation) until you feel a gentle stretch along the outside of your forearm. Hold this position for __________ seconds. Repeat __________ times. Complete this exercise __________ times a day. Strengthening exercises These exercises build strength and endurance in your forearm and elbow. Endurance is the ability to use your muscles for a long time, even after they get tired. Radial deviation  Stand with a __________ weight or a hammer in your left / right hand. Or, sit while holding a rubber exercise band or tubing, with your left / right forearm supported on a table or countertop. Position your forearm so that the thumb is facing the ceiling, as if you are going to clap your hands. This is the neutral position. Raise your hand upward in front of you so your thumb moves toward the ceiling (radial deviation), or pull up on the rubber tubing. Keep your forearm and elbow still while you move your wrist only. Hold this position for __________ seconds. Slowly return to the starting position. Repeat __________ times. Complete this exercise __________ times  a day. Wrist extension, eccentric Sit with your left / right forearm palm-down and supported on a table or other surface. Let your left / right wrist extend over the edge of the surface. Hold a __________ weight or a piece of exercise band or tubing in your left / right hand. If using a rubber exercise band or tubing, hold the other end of  the tubing with your other hand. Use your uninjured hand to move your left / right hand up toward the ceiling. Take your uninjured hand away and slowly return to the starting position using only your left / right hand. Lowering your arm under tension is called eccentric extension. Repeat __________ times. Complete this exercise __________ times a day. Wrist extension Do not do this exercise if it causes pain at the outside of your elbow. Only do this exercise once instructed by your health care provider. Sit with your left / right forearm supported on a table or other surface and your palm turned down toward the floor. Let your left / right wrist extend over the edge of the surface. Hold a __________ weight or a piece of rubber exercise band or tubing. If you are using a rubber exercise band or tubing, hold the band or tubing in place with your other hand to provide resistance. Slowly bend your wrist so your hand moves up toward the ceiling (extension). Move only your wrist, keeping your forearm and elbow still. Hold this position for __________ seconds. Slowly return to the starting position. Repeat __________ times. Complete this exercise __________ times a day. Forearm rotation, supination To do this exercise, you will need a lightweight hammer or rubber mallet. Sit with your left / right forearm supported on a table or other surface. Bend your elbow to a 90-degree angle (right angle). Position your forearm so that your palm is facing down toward the floor, with your hand resting over the edge of the table. Hold a hammer in your left / right hand. To make this exercise easier, hold the hammer near the head of the hammer. To make this exercise harder, hold the hammer near the end of the handle. Without moving your wrist or elbow, slowly rotate your forearm so your palm faces up toward the ceiling (supination). Hold this position for __________ seconds. Slowly return to the starting  position. Repeat __________ times. Complete this exercise __________ times a day. Shoulder blade squeeze Sit in a stable chair or stand with good posture. If you are sitting down, do not let your back touch the back of the chair. Your arms should be at your sides with your elbows bent to a 90-degree angle (right angle). Position your forearms so that your thumbs are facing the ceiling (neutral position). Without lifting your shoulders up, squeeze your shoulder blades tightly together. Hold this position for __________ seconds. Slowly release and return to the starting position. Repeat __________ times. Complete this exercise __________ times a day. This information is not intended to replace advice given to you by your health care provider. Make sure you discuss any questions you have with your health care provider. Document Revised: 01/27/2020 Document Reviewed: 01/27/2020 Elsevier Patient Education  [place]  Tennis elbow is irritation and swelling (inflammation) in your outer forearm, near your elbow. Swelling affects the tissues that connect muscle to bone (tendons). Tennis elbow can happen playing any sport or doing any job where you use your elbow too much. It is caused by doing the same motion  over and over. What are the causes? This condition is often caused by playing sports or doing work where you need to keep moving your forearm the same way. Sometimes, it may be caused by a sudden injury. What increases the risk? You are more likely to get tennis elbow if you play tennis or another racket sport. You also have a higher risk if you often use your hands for work. This includes: People who use computers. Architect workers. People who work in a factory. Musicians. Cooks. Cashiers. What are the signs or symptoms? Pain and tenderness in your forearm and the outer part of your elbow. You may have pain all the time or only when you use your arm. A burning  feeling. This starts in your elbow and spreads down your arm. A weak grip in your hand. How is this treated? Resting and icing your arm is often the first treatment. Your doctor may also recommend: Medicines to reduce pain and swelling. An elbow strap. Physical therapy. This may include massage or exercises or both. An elbow brace. If these do not help your symptoms get better, your doctor may recommend surgery. Follow these instructions at home: If you have a brace or strap: Wear the brace or strap as told by your doctor. Take it off only as told by your doctor. Check the skin around the brace or strap every day. Tell your doctor if you see problems. Loosen it if your fingers: Tingle. Become numb. Turn cold and blue. Keep the brace or strap clean. If the brace or strap is not waterproof: Do not let it get wet. Cover it with a watertight covering when you take a bath or a shower. Managing pain, stiffness, and swelling  If told, put ice on the injured area. To do this: If you have a removable brace or strap, take it off as told by your doctor. Put ice in a plastic bag. Place a towel between your skin and the bag. Leave the ice on for 20 minutes, 2-3 times a day. Take off the ice if your skin turns bright red. This is very important. If you cannot feel pain, heat, or cold, you have a greater risk of damage to the area. Move your fingers often. Activity Rest your elbow and wrist. Avoid activities that can cause elbow problems as told by your doctor. Do exercises as told by your doctor. If you lift an object, lift it with your palm facing up. Lifestyle If your tennis elbow is caused by sports, check your equipment and make sure that: You are using it the right way. It fits you well. If your tennis elbow is caused by work or computer use, take breaks often to stretch your arm. Talk with your manager about how you can make your condition better at work. General instructions Take  over-the-counter and prescription medicines only as told by your doctor. Do not smoke or use any products that contain nicotine or tobacco. If you need help quitting, ask your doctor. Keep all follow-up visits. How is this prevented? Before and after being active: Warm up and stretch before being active. Cool down and stretch after being active. Give your body time to rest between activities. While being active: Make sure to use equipment that fits you. If you play tennis, put power in your stroke with your lower body. Avoid using your arm only. Maintain physical fitness. This includes: Strength. Flexibility. Endurance. Do exercises to strengthen the forearm muscles. Contact a doctor if: Your  pain does not get better with treatment. Your pain gets worse. You have weakness in your forearm, hand, or fingers. You cannot feel your forearm, hand, or fingers. Get help right away if: Your pain is very bad. You cannot move your wrist. Summary Tennis elbow is irritation and swelling (inflammation) in your outer forearm, near your elbow. Tennis elbow is caused by doing the same motion over and over. Rest your elbow and wrist. Avoid activities as told by your doctor. If told, put ice on the injured area for 20 minutes, 2-3 times a day. This information is not intended to replace advice given to you by your health care provider. Make sure you discuss any questions you have with your health care provider. Document Revised: 05/17/2020 Document Reviewed: 05/17/2020 Elsevier Patient Education  Adrian Schneider.

## 2022-09-21 NOTE — Progress Notes (Signed)
Subjective:    Patient ID: Adrian Schneider, male    DOB: [birth year], 56 y.o.   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  Has had pain in RIght lateral elbow since that time .  He was prescribed tizanidine (which made him drowsy ) as well as meloxicam ( which he did not use due to fears of it causing drowsiness)  Tried ES tylenol , and icing   No bruising when this occurred.  Not having any point tenderness but mainly pain when grasping No numbness tingling in hand, no neck pain.  No progressive weakness  Pain Inventory Average Pain 4 Pain Right Now 4 My pain is stabbing  In the last 24 hours, has pain interfered with the following? General activity 5 Relation with others 1 Enjoyment of life 5 What TIME of day is your pain at its worst? varies Sleep (in general) Good  Pain is worse with: . Pain improves with:  . Relief from Meds: 3  Family History  Problem Relation Age of Onset   Coronary artery disease Mother        onset?   Hypertension Mother    Skin cancer Mother    Hypertension Father    Diabetes Father    Heart failure Father    Colon polyps Sister    Coronary artery disease Maternal Grandmother    Lung cancer Other        aunt, uncle , smokers    Breast cancer Other        cousins   Stroke Neg Hx    Colon cancer Neg Hx    Prostate cancer Neg Hx    Esophageal cancer Neg Hx    Stomach cancer Neg Hx    Rectal cancer Neg Hx    Social History   Socioeconomic History   Marital status: Single    Spouse name: Not on file   Number of children: 0   Years of education: Not on file   Highest education level: Not on file  Occupational History   Occupation: Chief Strategy Officer @ [employer]    Employer: [employer]  Tobacco Use   Smoking status: Never   Smokeless tobacco: Never  Vaping Use   Vaping Use:  Never used  Substance and Sexual Activity   Alcohol use: Yes    Alcohol/week: 0.0 standard drinks of alcohol    Comment: socially; weekly 1-2 beers weekly   Drug use: No   Sexual activity: Not on file  Other Topics Concern   Not on file  Social History Narrative   Single, no children, lives by himself   Social Determinants of Health   Financial Resource Strain: Not on file  Food Insecurity: Not on file  Transportation Needs: Not on file  Physical Activity: Not on file  Stress: Not on file  Social Connections: Not on file   Past Surgical History:  Procedure Laterality Date   COLONOSCOPY  2016   INCISION AND DRAINAGE Left 08/01/2020   LEG SURGERY Bilateral    release muscles d/t "cerebral palsy"   Past Surgical History:  Procedure Laterality Date   COLONOSCOPY  2016   INCISION AND DRAINAGE Left 08/01/2020   LEG SURGERY Bilateral    release muscles d/t "cerebral palsy"   Past Medical History:  Diagnosis Date  Allergy    dust mold   Cerebral palsy ([place])    dx at birth, has poor balance   Concussion 07/19/2016   fell down stair onto concrete   Hemorrhoids    Hyperlipidemia 6/11   started meds   Hypertension 12/2009   Left ankle pain    MRI in 06/2015 did not show specific internal derangement, had mild degenerative chondral thinning in the tibiotalar joint w/o a focal lesion   MVP (mitral valve prolapse)    dx years ago   Wart    L foot, non- healing lesion, f/u by derm   BP 124/72   Pulse 81   Ht '5\' 8"'$  (1.727 m)   Wt 191 lb (86.6 kg)   SpO2 97%   BMI 29.04 kg/m   Opioid Risk Score:   Fall Risk Score:  `1  Depression screen [facility] 2/9     08/09/2022   11:24 AM 06/18/2022    9:12 AM 12/19/2021    8:25 AM 07/17/2021    8:09 AM 01/12/2021    9:14 AM 01/12/2020    9:15 AM 07/24/2018   10:46 AM  Depression screen [facility] 2/9  Decreased Interest 0 0 0 0 0 0 0  Down, Depressed, Hopeless 0 0 1 0 0 0 0  PHQ - 2 Score 0 0 1 0 0 0 0     Review of Systems   Musculoskeletal:        Right arm/elbow pain Left ankle pain  All other systems reviewed and are negative.     Objective:   Physical Exam Vitals and nursing note reviewed.  Constitutional:      Appearance: He is normal weight.  HENT:     Head: Normocephalic and atraumatic.  Eyes:     Extraocular Movements: Extraocular movements intact.     Conjunctiva/sclera: Conjunctivae normal.     Pupils: Pupils are equal, round, and reactive to light.  Neurological:     Mental Status: He is alert and oriented to person, place, and time.     Comments: 4/5 grip RUE due to pain  5/5 Delt, Bi tri  LE 4/5 HF, KE 4- ankle DF and PF, inversion and eversion  Tone is increased in LE knee flexors and ankle Plantar flexors  Psychiatric:        Mood and Affect: Mood normal.        Behavior: Behavior normal.           Assessment & Plan:   RIght elbow pain due to lateral epicondylitis Advise relative rest, avoid grasping  Tennis elbow strap during work activity May start meloxicam (has bottle) take 1 tab per day with food May use voltaren gel 3-4 x per day  2.  CP with spastic diplegia, resume baclofen '5mg'$  TID  RTC 6wk

## 2022-10-06 ENCOUNTER — Other Ambulatory Visit: Payer: Self-pay | Admitting: Family Medicine

## 2022-10-06 DIAGNOSIS — M545 Low back pain, unspecified: Secondary | ICD-10-CM

## 2022-10-17 ENCOUNTER — Other Ambulatory Visit: Payer: Self-pay | Admitting: Internal Medicine

## 2022-10-31 ENCOUNTER — Encounter: Payer: Self-pay | Admitting: Family Medicine

## 2022-10-31 ENCOUNTER — Telehealth (INDEPENDENT_AMBULATORY_CARE_PROVIDER_SITE_OTHER): Payer: Commercial Managed Care - PPO | Admitting: Family Medicine

## 2022-10-31 DIAGNOSIS — U071 COVID-19: Secondary | ICD-10-CM | POA: Diagnosis not present

## 2022-10-31 MED ORDER — NIRMATRELVIR/RITONAVIR (PAXLOVID)TABLET: 3.0000 | ORAL_TABLET | Freq: Two times a day (BID) | ORAL | 0 refills | Status: AC

## 2022-10-31 MED ORDER — PROMETHAZINE-DM 6.25-15 MG/5ML PO SYRP: 5.0000 mL | ORAL_SOLUTION | Freq: Four times a day (QID) | ORAL | 0 refills | Status: DC | PRN

## 2022-10-31 NOTE — Progress Notes (Signed)
Chief Complaint  Patient presents with   Covid Positive    10/31/2022     Adrian Schneider here for URI complaints. Due to COVID-19 pandemic, we are interacting via web portal for an electronic face-to-face visit. I verified patient's ID using 2 identifiers. Patient agreed to proceed with visit via this method. Patient is at home, I am at office. Patient and I are present for visit.   Duration: 1 day  Associated symptoms: Fever (100.3 F), rhinorrhea, myalgia, and coughing Denies: sinus congestion, sinus pain, itchy watery eyes, ear pain, ear drainage, sore throat, wheezing, shortness of breath, loss of taste/smell, and N/V/D Treatment to date: Delsym, nasal saline Sick contacts: No Tested + for covid today.  Did not get the most recent covid vaccine.   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, has poor balance   Concussion 07/19/2016   fell down stair onto concrete   Hemorrhoids    Hyperlipidemia 6/11   started meds   Hypertension 12/2009   Left ankle pain    MRI in 06/2015 did not show specific internal derangement, had mild degenerative chondral thinning in the tibiotalar joint w/o a focal lesion   MVP (mitral valve prolapse)    dx years ago   Wart    L foot, non- healing lesion, f/u by derm    Objective No conversational dyspnea Age appropriate judgment and insight Nml affect and mood  COVID-19 - Plan: promethazine-dextromethorphan (PROMETHAZINE-DM) 6.25-15 MG/5ML syrup, nirmatrelvir/ritonavir EUA (PAXLOVID) 20 x 150 MG & 10 x '100MG'$  TABS  5 d of Paxlovid. He will hold Zocor for 5 d while on it and another 3 after finishing (total of 8 d). Syrup as above, warned about drowsiness. Discussed CDC quarantining guidelines. Continue to push fluids, practice good hand hygiene, cover mouth when coughing. F/u prn. If starting to experience irreplaceable fluid loss, shaking, or shortness of breath, seek immediate care. Pt  voiced understanding and agreement to the plan.  Vian, DO 10/31/22 9:21 AM

## 2022-11-02 ENCOUNTER — Encounter: Payer: Commercial Managed Care - PPO | Admitting: Physical Medicine & Rehabilitation

## 2022-11-06 ENCOUNTER — Encounter: Payer: Self-pay | Admitting: Family Medicine

## 2022-11-23 ENCOUNTER — Encounter: Payer: Commercial Managed Care - PPO | Admitting: Physical Medicine & Rehabilitation

## 2022-12-16 ENCOUNTER — Other Ambulatory Visit: Payer: Self-pay | Admitting: Internal Medicine

## 2022-12-19 ENCOUNTER — Ambulatory Visit: Payer: Commercial Managed Care - PPO | Admitting: Internal Medicine

## 2022-12-19 ENCOUNTER — Encounter: Payer: Self-pay | Admitting: Internal Medicine

## 2022-12-19 VITALS — BP 126/72 | HR 76 | Temp 98.2°F | Resp 16 | Ht 68.0 in | Wt 196.5 lb

## 2022-12-19 DIAGNOSIS — Z0000 Encounter for general adult medical examination without abnormal findings: Secondary | ICD-10-CM | POA: Diagnosis not present

## 2022-12-19 DIAGNOSIS — E785 Hyperlipidemia, unspecified: Secondary | ICD-10-CM

## 2022-12-19 DIAGNOSIS — I10 Essential (primary) hypertension: Secondary | ICD-10-CM

## 2022-12-19 DIAGNOSIS — R739 Hyperglycemia, unspecified: Secondary | ICD-10-CM | POA: Diagnosis not present

## 2022-12-19 DIAGNOSIS — Z23 Encounter for immunization: Secondary | ICD-10-CM

## 2022-12-19 LAB — COMPREHENSIVE METABOLIC PANEL
ALT: 22 U/L (ref 0–53)
AST: 21 U/L (ref 0–37)
Albumin: 4.4 g/dL (ref 3.5–5.2)
Alkaline Phosphatase: 63 U/L (ref 39–117)
BUN: 14 mg/dL (ref 6–23)
CO2: 28 mEq/L (ref 19–32)
Calcium: 9.1 mg/dL (ref 8.4–10.5)
Chloride: 104 mEq/L (ref 96–112)
Creatinine, Ser: 0.96 mg/dL (ref 0.40–1.50)
GFR: 86.84 mL/min (ref >60.00)
Glucose, Bld: 100 mg/dL — ABNORMAL HIGH (ref 70–99)
Potassium: 4.5 mEq/L (ref 3.5–5.1)
Sodium: 139 mEq/L (ref 135–145)
Total Bilirubin: 0.8 mg/dL (ref 0.2–1.2)
Total Protein: 7 g/dL (ref 6.0–8.3)

## 2022-12-19 LAB — CBC WITH DIFFERENTIAL/PLATELET
Basophils Absolute: 0 10*3/uL (ref 0.0–0.1)
Basophils Relative: 0.7 % (ref 0.0–3.0)
Eosinophils Absolute: 0.1 10*3/uL (ref 0.0–0.7)
Eosinophils Relative: 1.7 % (ref 0.0–5.0)
HCT: 45.3 % (ref 39.0–52.0)
Hemoglobin: 15.5 g/dL (ref 13.0–17.0)
Lymphocytes Relative: 31.7 % (ref 12.0–46.0)
Lymphs Abs: 1.8 10*3/uL (ref 0.7–4.0)
MCHC: 34.1 g/dL (ref 30.0–36.0)
MCV: 90.2 fl (ref 78.0–100.0)
Monocytes Absolute: 0.5 10*3/uL (ref 0.1–1.0)
Monocytes Relative: 9.3 % (ref 3.0–12.0)
Neutro Abs: 3.2 10*3/uL (ref 1.4–7.7)
Neutrophils Relative %: 56.6 % (ref 43.0–77.0)
Platelets: 322 10*3/uL (ref 150.0–400.0)
RBC: 5.02 Mil/uL (ref 4.22–5.81)
RDW: 13.8 % (ref 11.5–15.5)
WBC: 5.6 10*3/uL (ref 4.0–10.5)

## 2022-12-19 LAB — LIPID PANEL
Cholesterol: 179 mg/dL (ref 0–200)
HDL: 48.6 mg/dL (ref >39.00)
LDL Cholesterol: 112 mg/dL — ABNORMAL HIGH (ref 0–99)
NonHDL: 130.67
Total CHOL/HDL Ratio: 4
Triglycerides: 93 mg/dL (ref 0.0–149.0)
VLDL: 18.6 mg/dL (ref 0.0–40.0)

## 2022-12-19 LAB — PSA: PSA: 0.92 ng/mL (ref 0.10–4.00)

## 2022-12-19 LAB — HEMOGLOBIN A1C: Hgb A1c MFr Bld: 5.8 % (ref 4.6–6.5)

## 2022-12-19 NOTE — Progress Notes (Signed)
Subjective:    Patient ID: Adrian Schneider, male    DOB: 1966/08/14, 57 y.o.   MRN: [id number]  DOS:  12/19/2022 Type of visit - description: CPX  CPX. Since the last visit had a fall August 2023, developed elbow pain, saw Dr. Letta Pate. Has noticed he is less steady than before. + Weight gain, admits that he has been less active and is not eating healthy.  Wt Readings from Last 3 Encounters:  12/19/22 196 lb 8 oz (89.1 kg)  09/21/22 191 lb (86.6 kg)  08/09/22 189 lb (85.7 kg)    Review of Systems  Other than above, a 14 point review of systems is negative    Past Medical History:  Diagnosis Date   Allergy    dust mold   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, has poor balance   Concussion 07/19/2016   fell down stair onto concrete   Hemorrhoids    Hyperlipidemia 6/11   started meds   Hypertension 12/2009   Left ankle pain    MRI in 06/2015 did not show specific internal derangement, had mild degenerative chondral thinning in the tibiotalar joint w/o a focal lesion   MVP (mitral valve prolapse)    dx years ago   Wart    L foot, non- healing lesion, f/u by derm    Past Surgical History:  Procedure Laterality Date   COLONOSCOPY  2016   INCISION AND DRAINAGE Left 08/01/2020   LEG SURGERY Bilateral    release muscles d/t "cerebral palsy"   Social History   Socioeconomic History   Marital status: Single    Spouse name: Not on file   Number of children: 0   Years of education: Not on file   Highest education level: Not on file  Occupational History   Occupation: Chief Strategy Officer @ [employer]    Employer: [employer]  Tobacco Use   Smoking status: Never   Smokeless tobacco: Never  Vaping Use   Vaping Use: Never used  Substance and Sexual Activity   Alcohol use: Yes    Alcohol/week: 0.0 standard drinks of alcohol    Comment: socially; weekly 1-2 beers weekly   Drug use: No   Sexual activity: Not on file  Other Topics Concern   Not on file  Social History Narrative    Single, no children, lives by himself   Social Determinants of Health   Financial Resource Strain: Not on file  Food Insecurity: Not on file  Transportation Needs: Not on file  Physical Activity: Not on file  Stress: Not on file  Social Connections: Not on file  Intimate Partner Violence: Not on file     Current Outpatient Medications  Medication Instructions   baclofen (LIORESAL) 10 MG tablet TAKE 1/2 TABLET BY MOUTH THREE TIMES DAILY   losartan (COZAAR) 50 mg, Oral, Daily   simvastatin (ZOCOR) 40 mg, Oral, Daily   triamcinolone cream (KENALOG) 0.1 %        Objective:   Physical Exam BP 126/72   Pulse 76   Temp 98.2 F (36.8 C) (Oral)   Resp 16   Ht '5\' 8"'$  (1.727 m)   Wt 196 lb 8 oz (89.1 kg)   SpO2 97%   BMI 29.88 kg/m  General: Well developed, NAD, BMI noted Neck: No  thyromegaly  HEENT:  Normocephalic . Face symmetric, atraumatic Lungs:  CTA B Normal respiratory effort, no intercostal retractions, no accessory muscle use. Heart: RRR,  no murmur.  Abdomen:  Not distended, soft, non-tender. No rebound or rigidity.   Lower extremities: no pretibial edema bilaterally  Skin: Exposed areas without rash. Not pale. Not jaundice Neurologic:  alert & oriented X3.  Speech normal, gait unassisted today.  At baseline. Psych: Cognition and judgment appear intact.  Cooperative with normal attention span and concentration.  Behavior appropriate. No anxious or depressed appearing.     Assessment     Assessment  Prediabetes HTN Hyperlipidemia, 2011 Cerebral palsy,   poor balance DJD - sees [facility]o prn   Sees dermatology q year, h/o groin eczema E.D. Tinnitus ~ 11-2016, saw Dr. Cresenciano Lick    PLAN: Here for CPX Diabetes: diet controlled, Check A1c HTN:BP is very good, continue losartan.  Labs. High cholesterol: On simvastatin.  Labs Cerebral palsy, poor balance: Had a fall, recommend PT, declined it due to cost but plans to start to be active again and go   to the gym.  Encouraged to be sure he is in a safe environment. RTC 4 months

## 2022-12-19 NOTE — Assessment & Plan Note (Signed)
Here for CPX Diabetes: diet controlled, Check A1c HTN:BP is very good, continue losartan.  Labs. High cholesterol: On simvastatin.  Labs Cerebral palsy, poor balance: Had a fall, recommend PT, declined it due to cost but plans to start to be active again and go  to the gym.  Encouraged to be sure he is in a safe environment. RTC 4 months

## 2022-12-19 NOTE — Assessment & Plan Note (Signed)
-  Td 02-2015 - s/p Shingrix x 2 - COVID VAX: booster rec  -  flu shot today  -Prostate cancer screening, DRE and  PSA wnl 11-2021, check a PSA, no sxs  -Colon cancer screening, Cscope 2016, C-scope 07-2020, next 5 years per GI -Labs: CMP FLP CBC A1c PSA -diet, exercise: Admits to poor diet, plans to change.  Has not been active, + weight gain, plans to go back on the gym and increase physical activity.  Encouraged to do that in a safe environment. Declined PT.  Healthcare POA: See AVS

## 2022-12-19 NOTE — Patient Instructions (Addendum)
Vaccines I recommend : Covid booster  Go back to a healthier diet as you are planning  Go back to the gym. Consider physical therapy   GO TO THE LAB : Get the blood work     [place], [place] Come back for checkup in 4 months    "[place] of attorney" ,  "Living will" (Advance care planning documents)  If you already have a living will or healthcare power of attorney, is recommended you bring the copy to be scanned in your chart.   The document will be available to all the doctors you see in the system.  Advance care planning is a process that supports adults in  understanding and sharing their preferences regarding future medical care.  The patient's preferences are recorded in documents called Advance Directives and the can be modified at any time while the patient is in full mental capacity.   If you don't have one, please consider create one.      More information at: [email or web address]

## 2023-01-04 ENCOUNTER — Encounter: Payer: Self-pay | Admitting: Family Medicine

## 2023-04-15 ENCOUNTER — Other Ambulatory Visit: Payer: Self-pay | Admitting: Internal Medicine

## 2023-04-22 ENCOUNTER — Ambulatory Visit: Payer: Commercial Managed Care - PPO | Admitting: Internal Medicine

## 2023-05-01 ENCOUNTER — Ambulatory Visit: Payer: Commercial Managed Care - PPO | Admitting: Internal Medicine

## 2023-05-01 ENCOUNTER — Encounter: Payer: Self-pay | Admitting: Internal Medicine

## 2023-05-01 VITALS — BP 120/82 | HR 81 | Temp 98.2°F | Resp 16 | Ht 68.0 in | Wt 191.5 lb

## 2023-05-01 DIAGNOSIS — I10 Essential (primary) hypertension: Secondary | ICD-10-CM

## 2023-05-01 DIAGNOSIS — E785 Hyperlipidemia, unspecified: Secondary | ICD-10-CM | POA: Diagnosis not present

## 2023-05-01 LAB — BASIC METABOLIC PANEL
BUN: 13 mg/dL (ref 6–23)
CO2: 25 mEq/L (ref 19–32)
Calcium: 9.3 mg/dL (ref 8.4–10.5)
Chloride: 102 mEq/L (ref 96–112)
Creatinine, Ser: 1.04 mg/dL (ref 0.40–1.50)
GFR: 78.69 mL/min (ref >60.00)
Glucose, Bld: 106 mg/dL — ABNORMAL HIGH (ref 70–99)
Potassium: 4.5 mEq/L (ref 3.5–5.1)
Sodium: 136 mEq/L (ref 135–145)

## 2023-05-01 MED ORDER — SILDENAFIL CITRATE 20 MG PO TABS: 60.0000 mg | ORAL_TABLET | Freq: Every evening | ORAL | 3 refills | Status: DC | PRN

## 2023-05-01 NOTE — Progress Notes (Signed)
Subjective:    Patient ID: Adrian Schneider, male    DOB: June 09, 1966, 57 y.o.   MRN: [id number]  DOS:  05/01/2023 Type of visit - description: Follow-up  Since LOV, ambulatory blood pressures have been normal. Still has occasional back spasms. Room for improvement on diet and exercise Required a refill on sildenafil. Also, for the last 2 months, has noted cough or spitting some clear phlegm, thinks is postnasal dripping. No fever or chills.  No wheezing, no GERD.  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, has poor balance   Concussion 07/19/2016   fell down stair onto concrete   Hemorrhoids    Hyperlipidemia 6/11   started meds   Hypertension 12/2009   Left ankle pain    MRI in 06/2015 did not show specific internal derangement, had mild degenerative chondral thinning in the tibiotalar joint w/o a focal lesion   MVP (mitral valve prolapse)    dx years ago   Wart    L foot, non- healing lesion, f/u by derm    Past Surgical History:  Procedure Laterality Date   COLONOSCOPY  2016   INCISION AND DRAINAGE Left 08/01/2020   LEG SURGERY Bilateral    release muscles d/t "cerebral palsy"    Current Outpatient Medications  Medication Instructions   baclofen (LIORESAL) 10 MG tablet TAKE 1/2 TABLET BY MOUTH THREE TIMES DAILY   losartan (COZAAR) 50 mg, Oral, Daily   sildenafil (REVATIO) 60-80 mg, Oral, At bedtime PRN   simvastatin (ZOCOR) 40 mg, Oral, Daily   triamcinolone cream (KENALOG) 0.1 %        Objective:   Physical Exam BP 120/82   Pulse 81   Temp 98.2 F (36.8 C) (Oral)   Resp 16   Ht 5\' 8"  (1.727 m)   Wt 191 lb 8 oz (86.9 kg)   SpO2 96%   BMI 29.12 kg/m  General:   Well developed, NAD, BMI noted. HEENT:  Normocephalic . Face symmetric, atraumatic Left ear: Wax.  Right ear: Normal Nose slightly congested, throat symmetric not red Lungs:  CTA B Normal  respiratory effort, no intercostal retractions, no accessory muscle use. Heart: RRR,  no murmur.  Lower extremities: no pretibial edema bilaterally  Skin: Not pale. Not jaundice Neurologic:  alert & oriented X3.  Speech normal, gait unchanged, consistent with cerebral palsy. Psych--  Cognition and judgment appear intact.  Cooperative with normal attention span and concentration.  Behavior appropriate. No anxious or depressed appearing.      Assessment    Assessment  Prediabetes HTN Hyperlipidemia,  Baseline labs from 02/2009--- total cholesterol 279, LDL 207  Cerebral palsy,   poor balance DJD - sees [facility]o prn   Sees dermatology q year, h/o groin eczema E.D. Tinnitus ~ 11-2016, saw Dr. Jac Canavan    PLAN: HTN: Ambulatory BPs are very good, his BP cuff is check today and  working properly.  Check BMP.  Continue losartan. High cholesterol: Baseline cholesterol April 2010:  279 with a LDL level of 207.  On simvastatin, I do not believe he has tried other statins.  Last LDL 112.  Plans to do better with diet and exercise.  Recheck labs on RTC. Cough: See description above, possibly from postnasal dripping.  Recommend Flonase and Allegra.  Consider PPI trial. Vaccines: Recommend flu shot this fall  and COVID booster if not done recently ED: Request sildenafil.  Will do. RTC 11-2023 CPX

## 2023-05-01 NOTE — Patient Instructions (Addendum)
Cough: Flonase 2 sprays on each side of the nose every day consistently Allegra 60 mg 1 tablet twice daily as needed. Call if not gradually better  Vaccines I recommend: Covid booster if not done within the last 6 months Flu shot this fall  Continue checking your blood pressure BP GOAL is between 110/65 and  135/85. If it is consistently higher or lower, let me know     GO TO THE LAB : Get the blood work     GO TO THE FRONT DESK, PLEASE SCHEDULE YOUR APPOINTMENTS Come back for a physical exam by 11-2023

## 2023-05-02 NOTE — Assessment & Plan Note (Signed)
HTN: Ambulatory BPs are very good, his BP cuff is check today and  working properly.  Check BMP.  Continue losartan. High cholesterol: Baseline cholesterol April 2010:  279 with a LDL level of 207.  On simvastatin, I do not believe he has tried other statins.  Last LDL 112.  Plans to do better with diet and exercise.  Recheck labs on RTC. Cough: See description above, possibly from postnasal dripping.  Recommend Flonase and Allegra.  Consider PPI trial. Vaccines: Recommend flu shot this fall and COVID booster if not done recently ED: Request sildenafil.  Will do. RTC 11-2023 CPX

## 2023-07-23 ENCOUNTER — Other Ambulatory Visit: Payer: Self-pay | Admitting: Internal Medicine

## 2023-08-14 ENCOUNTER — Other Ambulatory Visit: Payer: Self-pay | Admitting: Internal Medicine

## 2023-10-27 ENCOUNTER — Other Ambulatory Visit: Payer: Self-pay | Admitting: Internal Medicine

## 2023-11-28 ENCOUNTER — Telehealth (INDEPENDENT_AMBULATORY_CARE_PROVIDER_SITE_OTHER): Payer: Commercial Managed Care - PPO | Admitting: Physician Assistant

## 2023-11-28 ENCOUNTER — Encounter: Payer: Self-pay | Admitting: Physician Assistant

## 2023-11-28 ENCOUNTER — Ambulatory Visit: Payer: Self-pay | Admitting: Internal Medicine

## 2023-11-28 VITALS — Temp 99.2°F

## 2023-11-28 DIAGNOSIS — J069 Acute upper respiratory infection, unspecified: Secondary | ICD-10-CM | POA: Diagnosis not present

## 2023-11-28 NOTE — Telephone Encounter (Signed)
  Chief Complaint: Cough, fever Symptoms: dry hacking cough, fever Frequency: Ongoing about 2 days Pertinent Negatives: Patient denies CP, SOB Disposition: [] ED /[] Urgent Care (no appt availability in office) / [x] Appointment(In office/virtual)/ []  [facility]/ [] Home Care/ [] Refused Recommended Disposition /[] [facility]/ []  Follow-up with PCP Additional Notes: Pt reports dry hacking cough that hurts your ribcage and fever x 2 days. Pt denies CP and SOB. This RN spoke to pt briefly to assess life threatening symptoms that may need immediate emergent treatment. Home care may have been more appropriate however a full triage was unable to be completed as agent had pt scheduled for virtual visit at 10 am so this RN determined no emergent care needed and advised pt to join virtual appt as it was to begin in 2 minutes and pt was concerned about possible need for antibiotics and upcoming weekend. Pt verbalized understanding and agrees to plan.   Reason for Disposition  Cough with cold symptoms (e.g., runny nose, postnasal drip, throat clearing)  Answer Assessment - Initial Assessment Questions 1. ONSET: When did the cough begin?      Yesterday 2. SEVERITY: How bad is the cough today?      Hacking cough, the kind that hurts your rib cage 3. SPUTUM: Describe the color of your sputum (none, dry cough; clear, white, yellow, green)     None  5. DIFFICULTY BREATHING: Are you having difficulty breathing? If Yes, ask: How bad is it? (e.g., mild, moderate, severe)    - MILD: No SOB at rest, mild SOB with walking, speaks normally in sentences, can lie down, no retractions, pulse < 100.    - MODERATE: SOB at rest, SOB with minimal exertion and prefers to sit, cannot lie down flat, speaks in phrases, mild retractions, audible wheezing, pulse 100-120.    - SEVERE: Very SOB at rest, speaks in single words, struggling to breathe, sitting hunched forward, retractions, pulse >  120      None 6. FEVER: Do you have a fever? If Yes, ask: What is your temperature, how was it measured, and when did it start?     Yes, 101 last evening, 99 this AM 10. OTHER SYMPTOMS: Do you have any other symptoms? (e.g., runny nose, wheezing, chest pain)  Protocols used: Cough - Acute Non-Productive-A-AH

## 2023-11-28 NOTE — Telephone Encounter (Signed)
 Pt scheduled appt with Alfredia Ferguson for today.

## 2023-11-28 NOTE — Progress Notes (Signed)
    MyChart Video Visit    Virtual Visit via Video Note   This format is felt to be most appropriate for this patient at this time. Physical exam was limited by quality of the video and audio technology used for the visit.   Patient location: home Provider location: [email or web address]  I discussed the limitations of evaluation and management by telemedicine and the availability of in person appointments. The patient expressed understanding and agreed to proceed.  Patient: Adrian Schneider   DOB: 11-Apr-1966   58 y.o. Male  MRN: [id number] Visit Date: 11/28/2023  Today's healthcare provider: Manuelita Flatness, PA-C   Cc. Cough ,fever  Subjective    HPI   Pt reports that since Monday/Tuesday, x 2-3  days, he has had chills ,fever, a cough, body aches. Taking tylenol  over the counter. Negative covid test at home. Denies shortness of breath, wheezing.   Medications: Outpatient Medications Prior to Visit  Medication Sig   baclofen  (LIORESAL ) 10 MG tablet TAKE 1/2 TABLET BY MOUTH THREE TIMES DAILY   losartan  (COZAAR ) 50 MG tablet Take 1 tablet (50 mg total) by mouth daily.   sildenafil  (REVATIO ) 20 MG tablet Take 3-4 tablets (60-80 mg total) by mouth at bedtime as needed.   simvastatin  (ZOCOR ) 40 MG tablet Take 1 tablet (40 mg total) by mouth daily.   triamcinolone cream (KENALOG) 0.1 %    No facility-administered medications prior to visit.   Review of Systems  Constitutional:  Positive for fatigue and fever.  HENT:  Positive for congestion, postnasal drip, rhinorrhea and sore throat.   Respiratory:  Positive for cough. Negative for shortness of breath.   Cardiovascular:  Negative for chest pain, palpitations and leg swelling.  Neurological:  Positive for headaches. Negative for dizziness.        Objective    Temp 99.2 F (37.3 C)       Physical Exam Constitutional:      Appearance: Normal appearance. He is not ill-appearing.  Neurological:     Mental Status: He is oriented  to person, place, and time.  Psychiatric:        Mood and Affect: Mood normal.        Behavior: Behavior normal.        Assessment & Plan     1. Viral upper respiratory tract infection (Primary) Recommending fluids, rest, cont tylenol /ibuprofen. Recommending delsym or robitussion otc for cough. Explained viral vs bacterial illness If symptoms persist or worsen to contact office for further recommendations  Return if symptoms worsen or fail to improve.     I discussed the assessment and treatment plan with the patient. The patient was provided an opportunity to ask questions and all were answered. The patient agreed with the plan and demonstrated an understanding of the instructions.   The patient was advised to call back or seek an in-person evaluation if the symptoms worsen or if the condition fails to improve as anticipated.  I provided 7 minutes of non-face-to-face time during this encounter.  Manuelita Flatness, PA-C [facility] [phone] (phone) [phone] (fax)  [facility]

## 2023-12-18 ENCOUNTER — Ambulatory Visit (INDEPENDENT_AMBULATORY_CARE_PROVIDER_SITE_OTHER): Payer: Commercial Managed Care - PPO | Admitting: Internal Medicine

## 2023-12-18 ENCOUNTER — Encounter: Payer: Self-pay | Admitting: Internal Medicine

## 2023-12-18 VITALS — BP 116/66 | HR 85 | Temp 98.1°F | Resp 16 | Ht 68.0 in | Wt 195.1 lb

## 2023-12-18 DIAGNOSIS — R739 Hyperglycemia, unspecified: Secondary | ICD-10-CM

## 2023-12-18 DIAGNOSIS — I10 Essential (primary) hypertension: Secondary | ICD-10-CM

## 2023-12-18 DIAGNOSIS — G801 Spastic diplegic cerebral palsy: Secondary | ICD-10-CM

## 2023-12-18 DIAGNOSIS — Z0001 Encounter for general adult medical examination with abnormal findings: Secondary | ICD-10-CM | POA: Diagnosis not present

## 2023-12-18 DIAGNOSIS — E785 Hyperlipidemia, unspecified: Secondary | ICD-10-CM | POA: Diagnosis not present

## 2023-12-18 DIAGNOSIS — Z0000 Encounter for general adult medical examination without abnormal findings: Secondary | ICD-10-CM | POA: Diagnosis not present

## 2023-12-18 LAB — LIPID PANEL
Cholesterol: 168 mg/dL (ref 0–200)
HDL: 50.3 mg/dL (ref >39.00)
LDL Cholesterol: 99 mg/dL (ref 0–99)
NonHDL: 117.73
Total CHOL/HDL Ratio: 3
Triglycerides: 96 mg/dL (ref 0.0–149.0)
VLDL: 19.2 mg/dL (ref 0.0–40.0)

## 2023-12-18 LAB — CBC WITH DIFFERENTIAL/PLATELET
Basophils Absolute: 0 10*3/uL (ref 0.0–0.1)
Basophils Relative: 0.8 % (ref 0.0–3.0)
Eosinophils Absolute: 0.1 10*3/uL (ref 0.0–0.7)
Eosinophils Relative: 1.9 % (ref 0.0–5.0)
HCT: 45.6 % (ref 39.0–52.0)
Hemoglobin: 15.3 g/dL (ref 13.0–17.0)
Lymphocytes Relative: 25.3 % (ref 12.0–46.0)
Lymphs Abs: 1.4 10*3/uL (ref 0.7–4.0)
MCHC: 33.6 g/dL (ref 30.0–36.0)
MCV: 91.2 fL (ref 78.0–100.0)
Monocytes Absolute: 0.5 10*3/uL (ref 0.1–1.0)
Monocytes Relative: 9 % (ref 3.0–12.0)
Neutro Abs: 3.4 10*3/uL (ref 1.4–7.7)
Neutrophils Relative %: 63 % (ref 43.0–77.0)
Platelets: 314 10*3/uL (ref 150.0–400.0)
RBC: 5 Mil/uL (ref 4.22–5.81)
RDW: 13.6 % (ref 11.5–15.5)
WBC: 5.4 10*3/uL (ref 4.0–10.5)

## 2023-12-18 LAB — PSA: PSA: 0.98 ng/mL (ref 0.10–4.00)

## 2023-12-18 LAB — COMPREHENSIVE METABOLIC PANEL
ALT: 28 U/L (ref 0–53)
AST: 25 U/L (ref 0–37)
Albumin: 4.4 g/dL (ref 3.5–5.2)
Alkaline Phosphatase: 68 U/L (ref 39–117)
BUN: 12 mg/dL (ref 6–23)
CO2: 24 meq/L (ref 19–32)
Calcium: 9.2 mg/dL (ref 8.4–10.5)
Chloride: 105 meq/L (ref 96–112)
Creatinine, Ser: 0.92 mg/dL (ref 0.40–1.50)
GFR: 90.76 mL/min (ref >60.00)
Glucose, Bld: 94 mg/dL (ref 70–99)
Potassium: 4.1 meq/L (ref 3.5–5.1)
Sodium: 138 meq/L (ref 135–145)
Total Bilirubin: 0.9 mg/dL (ref 0.2–1.2)
Total Protein: 6.9 g/dL (ref 6.0–8.3)

## 2023-12-18 LAB — HEMOGLOBIN A1C: Hgb A1c MFr Bld: 6.1 % (ref 4.6–6.5)

## 2023-12-18 NOTE — Patient Instructions (Signed)
   GO TO THE LAB : Get the blood work     Next visit with me in 6 months for a routine checkup Please schedule it at the front desk       "Health Care Power of attorney" ,  "Living will" (Advance care planning documents)  If you already have a living will or healthcare power of attorney, is recommended you bring the copy to be scanned in your chart.   The document will be available to all the doctors you see in the system.  Advance care planning is a process that supports adults in  understanding and sharing their preferences regarding future medical care.  The patient's preferences are recorded in documents called Advance Directives and the can be modified at any time while the patient is in full mental capacity.   If you don't have one, please consider create one.      More information at: [email or web address]

## 2023-12-18 NOTE — Progress Notes (Signed)
Subjective:    Patient ID: Adrian Schneider, male    DOB: 04/16/1966, 58 y.o.   MRN: [id number]  DOS:  12/18/2023 Type of visit - description: CPX  Here for CPX Doing well. 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, has poor balance   Concussion 07/19/2016   fell down stair onto concrete   Hemorrhoids    Hyperlipidemia 6/11   started meds   Hypertension 12/2009   Left ankle pain    MRI in 06/2015 did not show specific internal derangement, had mild degenerative chondral thinning in the tibiotalar joint w/o a focal lesion   MVP (mitral valve prolapse)    dx years ago   Wart    L foot, non- healing lesion, f/u by derm    Past Surgical History:  Procedure Laterality Date   COLONOSCOPY  2016   INCISION AND DRAINAGE Left 08/01/2020   LEG SURGERY Bilateral    release muscles d/t "cerebral palsy"   Social History   Socioeconomic History   Marital status: Single    Spouse name: Not on file   Number of children: 0   Years of education: Not on file   Highest education level: Bachelor's degree (e.g., BA, AB, BS)  Occupational History   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[employer]    Employer: [employer]  Tobacco Use   Smoking status: Never   Smokeless tobacco: Never  Vaping Use   Vaping status: Never Used  Substance and Sexual Activity   Alcohol use: Yes    Alcohol/week: 0.0 standard drinks of alcohol    Comment: socially; weekly 1-2 beers weekly   Drug use: No   Sexual activity: Not on file  Other Topics Concern   Not on file  Social History Narrative   Single, no children, lives by himself   Social Drivers of Health   Financial Resource Strain: Low  Risk  (04/30/2023)   Overall Financial Resource Strain (CARDIA)    Difficulty of Paying Living Expenses: Not hard at all  Food Insecurity: No Food Insecurity (04/30/2023)   Hunger Vital Sign    Worried About Running Out of Food in the Last Year: Never true    Ran Out of Food in the Last Year: Never true  Transportation Needs: No Transportation Needs (04/30/2023)   PRAPARE - Administrator, Civil Service (Medical): No    Lack of Transportation (Non-Medical): No  Physical Activity: Insufficiently Active (04/30/2023)   Exercise Vital Sign    Days of Exercise per Week: 3 days    Minutes of Exercise per Session: 20 min  Stress: No Stress Concern Present (04/30/2023)   [employer] of Occupational Health - Occupational Stress Questionnaire    Feeling of Stress : Only a little  Social Connections: Moderately Isolated (04/30/2023)   Social Connection and Isolation Panel [NHANES]    Frequency of Communication with Friends and Family: More than three times a week    Frequency of Social Gatherings with Friends and Family: Twice a week  Attends Religious Services: More than 4 times per year    Active Member of Clubs or Organizations: No    Attends Engineer, structural: Not on file    Marital Status: Never married  Intimate Partner Violence: Not on file    Current Outpatient Medications  Medication Instructions   baclofen (LIORESAL) 10 MG tablet TAKE 1/2 TABLET BY MOUTH THREE TIMES DAILY   losartan (COZAAR) 50 mg, Oral, Daily   sildenafil (REVATIO) 60-80 mg, Oral, At bedtime PRN   simvastatin (ZOCOR) 40 mg, Oral, Daily   triamcinolone cream (KENALOG) 0.1 %        Objective:   Physical Exam BP 116/66   Pulse 85   Temp 98.1 F (36.7 C) (Oral)   Resp 16   Ht 5\' 8"  (1.727 m)   Wt 195 lb 2 oz (88.5 kg)   SpO2 98%   BMI 29.67 kg/m  General: Well developed, NAD, BMI noted Neck: No  thyromegaly  HEENT:  Normocephalic . Face symmetric, atraumatic Lungs:  CTA  B Normal respiratory effort, no intercostal retractions, no accessory muscle use. Heart: RRR,  no murmur.  Abdomen:  Not distended, soft, non-tender. No rebound or rigidity.   Lower extremities: no pretibial edema bilaterally  Skin: Exposed areas without rash. Not pale. Not jaundice Neurologic:  alert & oriented X3.  Speech normal, gait consistent with history of cerebral palsy.   Psych: Cognition and judgment appear intact.  Cooperative with normal attention span and concentration.  Behavior appropriate. No anxious or depressed appearing.     Assessment   Problem list Prediabetes HTN Hyperlipidemia,  Baseline labs 02/2009--- total cholesterol 279, LDL 207  Cerebral palsy,   poor balance DJD - sees [facility]o prn   Sees dermatology q year, h/o groin eczema E.D. Tinnitus ~ 11-2016, saw Dr. Jac Canavan    PLAN: Here for CPX - Td 02-2015 - s/p Shingrix x 2 -Had a recent flu and COVID-vaccine.  -Prostate cancer screening,   no sxs, check PSA -Colon cancer screening, Cscope 2016, C-scope 07-2020, next 5 years per GI -Labs: CMP FLP CBC A1c PSA -diet, exercise: We had an extensive discussion about eating healthy.  Plans to go back to more physical activity. - Healthcare POA: Information provided. Other issues addressed today: Hyperglycemia: Check A1c, extensive discussion. HTN: On losartan, BP looks good.  Checking labs Hyperlipidemia: Baseline LDL 207, on simvastatin, recheck FLP. RTC 6 months

## 2023-12-20 ENCOUNTER — Encounter: Payer: Self-pay | Admitting: Internal Medicine

## 2023-12-20 NOTE — Assessment & Plan Note (Signed)
Here for CPX - Td 02-2015 - s/p Shingrix x 2 -Had a recent flu and COVID-vaccine.  -Prostate cancer screening,   no sxs, check PSA -Colon cancer screening, Cscope 2016, C-scope 07-2020, next 5 years per GI -Labs: CMP FLP CBC A1c PSA -diet, exercise: We had an extensive discussion about eating healthy.  Plans to go back to more physical activity. - Healthcare POA: Information provided.

## 2023-12-20 NOTE — Assessment & Plan Note (Signed)
Here for CPX  Other issues addressed today: Hyperglycemia: Check A1c, extensive discussion. HTN: On losartan, BP looks good.  Checking labs Hyperlipidemia: Baseline LDL 207, on simvastatin, recheck FLP. RTC 6 months

## 2024-01-13 ENCOUNTER — Ambulatory Visit: Payer: Commercial Managed Care - PPO | Admitting: Family Medicine

## 2024-01-20 ENCOUNTER — Ambulatory Visit: Admitting: Family Medicine

## 2024-01-28 ENCOUNTER — Ambulatory Visit: Admitting: Internal Medicine

## 2024-01-28 ENCOUNTER — Encounter: Payer: Self-pay | Admitting: Internal Medicine

## 2024-01-28 VITALS — BP 118/86 | HR 68 | Temp 98.5°F | Resp 18 | Ht 68.0 in | Wt 192.4 lb

## 2024-01-28 DIAGNOSIS — J9801 Acute bronchospasm: Secondary | ICD-10-CM

## 2024-01-28 DIAGNOSIS — R051 Acute cough: Secondary | ICD-10-CM | POA: Diagnosis not present

## 2024-01-28 DIAGNOSIS — J101 Influenza due to other identified influenza virus with other respiratory manifestations: Secondary | ICD-10-CM | POA: Diagnosis not present

## 2024-01-28 MED ORDER — AIRSUPRA 90-80 MCG/ACT IN AERO
2.0000 | INHALATION_SPRAY | Freq: Four times a day (QID) | RESPIRATORY_TRACT | 0 refills | Status: DC | PRN
Start: 2024-01-28 — End: 2024-06-02

## 2024-01-28 MED ORDER — HYDROCODONE BIT-HOMATROP MBR 5-1.5 MG/5ML PO SOLN: 5.0000 mL | Freq: Every evening | ORAL | 0 refills | Status: DC | PRN

## 2024-01-28 NOTE — Patient Instructions (Addendum)
 Continue Mucinex as needed  Take the hydrocodone liquid medication at bedtime to help with cough  For chest congestion and persistent cough: Use either albuterol or if possible Airsupra 2 puffs every 6 hours as needed  Flonase 2 sprays on each side of the nose daily  Call if not gradually better in the next few days

## 2024-01-28 NOTE — Assessment & Plan Note (Signed)
 Influenza A, persistent cough, bronchospasm. Recently diagnosed with influenza A, finished Tamiflu and prednisone.  Was also prescribed albuterol. Has severe cough, suspect related to bronchospasm. Plan: Continue Mucinex OTC, cough controlled with hydrocodone at bedtime (patient accepted the risks of possibility of overuse or addiction), Airsupra every 6 hours as needed ( if unable to afford stay on albuterol),). Extensive coaching about how to use inhaler. Call if not gradually better

## 2024-01-28 NOTE — Progress Notes (Signed)
 Subjective:    Patient ID: Adrian Schneider, male    DOB: 09/17/1966, 58 y.o.   MRN: [id number]  DOS:  01/28/2024 Type of visit - description: Acute visit  01/13/2024: Micah Flesher to urgent care, respiratory symptoms, POC influenza A +, Rx albuterol, tamiflu, steroids, chest x-ray (peribronchial thickening, bronchitis or bronchiolitis.  No consolidation.)   He is here because although better he has a very persistent cough along with chest congestion. It is very bothersome at night and cannot sleep. Sputum  remained clear. Has minimal postnasal dripping. No fever or chills.   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, has poor balance   Concussion 07/19/2016   fell down stair onto concrete   Hemorrhoids    Hyperlipidemia 6/11   started meds   Hypertension 12/2009   Left ankle pain    MRI in 06/2015 did not show specific internal derangement, had mild degenerative chondral thinning in the tibiotalar joint w/o a focal lesion   MVP (mitral valve prolapse)    dx years ago   Wart    L foot, non- healing lesion, f/u by derm    Past Surgical History:  Procedure Laterality Date   COLONOSCOPY  2016   INCISION AND DRAINAGE Left 08/01/2020   LEG SURGERY Bilateral    release muscles d/t "cerebral palsy"    Current Outpatient Medications  Medication Instructions   albuterol (VENTOLIN HFA) 108 (90 Base) MCG/ACT inhaler 2 puffs, Every 4 hours PRN   Albuterol-Budesonide (AIRSUPRA) 90-80 MCG/ACT AERO 2 puffs, Inhalation, 4 times daily PRN   baclofen (LIORESAL) 10 MG tablet TAKE 1/2 TABLET BY MOUTH THREE TIMES DAILY   fluticasone (FLONASE) 50 MCG/ACT nasal spray 1 spray, Daily   guaiFENesin (MUCINEX) 1,200 mg, 2 times daily   HYDROcodone bit-homatropine (HYCODAN) 5-1.5 MG/5ML syrup 5 mLs, Oral, At bedtime PRN   losartan (COZAAR) 50 mg, Oral, Daily   simvastatin (ZOCOR) 40 mg, Oral, Daily    triamcinolone cream (KENALOG) 0.1 %        Objective:   Physical Exam BP 118/86   Pulse 68   Temp 98.5 F (36.9 C) (Oral)   Resp 18   Ht 5\' 8"  (1.727 m)   Wt 192 lb 6 oz (87.3 kg)   SpO2 98%   BMI 29.25 kg/m  General:   Well developed, NAD, BMI noted. HEENT:  Normocephalic . Face symmetric, atraumatic Nose slightly congested Lungs:  Frequent cough noted,  + mild airway congestion, increased expiratory time. Normal respiratory effort, no intercostal retractions, no accessory muscle use. Heart: RRR,  no murmur.  Lower extremities: no pretibial edema bilaterally  Skin: Not pale. Not jaundice Neurologic:  alert & oriented X3.  Speech normal, gait appropriate for age and unassisted Psych--  Cognition and judgment appear intact.  Cooperative with normal attention span and concentration.  Behavior appropriate. No anxious or depressed appearing.      Assessment     Problem list Prediabetes HTN Hyperlipidemia,  Baseline labs 02/2009--- total cholesterol 279, LDL 207  Cerebral palsy,   poor balance DJD - sees [facility]o prn   Sees dermatology q year, h/o groin eczema E.D. Tinnitus ~ 11-2016, saw Dr. Jac Canavan    PLAN: Influenza A, persistent cough, bronchospasm. Recently diagnosed with influenza A, finished Tamiflu and prednisone.  Was also prescribed albuterol. Has severe cough, suspect  related to bronchospasm. Plan: Continue Mucinex OTC, cough controlled with hydrocodone at bedtime (patient accepted the risks of possibility of overuse or addiction), Airsupra every 6 hours as needed ( if unable to afford stay on albuterol),). Extensive coaching about how to use inhaler. Call if not gradually better

## 2024-03-07 ENCOUNTER — Other Ambulatory Visit: Payer: Self-pay | Admitting: Internal Medicine

## 2024-03-17 ENCOUNTER — Encounter: Payer: Self-pay | Admitting: Internal Medicine

## 2024-03-17 DIAGNOSIS — Z0189 Encounter for other specified special examinations: Secondary | ICD-10-CM

## 2024-03-17 DIAGNOSIS — R0683 Snoring: Secondary | ICD-10-CM

## 2024-03-18 NOTE — Telephone Encounter (Signed)
 Neurology referral, snoring, r/o sleep apnea

## 2024-03-18 NOTE — Addendum Note (Signed)
 Addended by: Leara Rawl D on: 03/18/2024 08:21 AM   Modules accepted: Orders

## 2024-03-18 NOTE — Telephone Encounter (Signed)
 Referral placed.

## 2024-04-15 ENCOUNTER — Encounter: Payer: Self-pay | Admitting: Neurology

## 2024-04-15 ENCOUNTER — Ambulatory Visit: Admitting: Neurology

## 2024-04-15 VITALS — BP 114/72 | HR 85 | Ht 68.0 in | Wt 197.0 lb

## 2024-04-15 DIAGNOSIS — E663 Overweight: Secondary | ICD-10-CM

## 2024-04-15 DIAGNOSIS — G4763 Sleep related bruxism: Secondary | ICD-10-CM | POA: Diagnosis not present

## 2024-04-15 DIAGNOSIS — R635 Abnormal weight gain: Secondary | ICD-10-CM

## 2024-04-15 DIAGNOSIS — Z9189 Other specified personal risk factors, not elsewhere classified: Secondary | ICD-10-CM | POA: Diagnosis not present

## 2024-04-15 DIAGNOSIS — Z820 Family history of epilepsy and other diseases of the nervous system: Secondary | ICD-10-CM | POA: Diagnosis not present

## 2024-04-15 DIAGNOSIS — R0683 Snoring: Secondary | ICD-10-CM | POA: Diagnosis not present

## 2024-04-15 NOTE — Progress Notes (Signed)
 Subjective:    Patient ID: Adrian Schneider is a 58 y.o. male.  HPI    Adrian Fairy, MD, PhD [facility] [address] [address] [place], [place] [address]  Dear Dr. Neomi Banks,  I saw your patient, Adrian Schneider, upon your kind request in my sleep clinic today for initial consultation of his sleep disorder, in particular, concern for underlying obstructive sleep apnea.  The patient is unaccompanied today.  As you know, Adrian Schneider is a 58 year old male with an underlying medical history of allergies, cerebral palsy, history of concussion, hypertension, hyperlipidemia, mitral valve prolapse, and overweight state, who reports snoring and waking up with a snort at times.  He was encouraged by his dentist to get checked out for obstructive sleep apnea.  He has a history of teeth grinding.  His Epworth sleepiness score is 5 out of 24, fatigue severity score is 16 out of 63.  I reviewed your office note from 01/28/2024.  His father had sleep apnea.  Patient is single and lives alone, no pets in the household, no children.  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  He works second shift, typically from 1:30 PM to 10:30 PM or 2:30 PM to 11:30 PM.  Typical bedtime is around 2 AM and rise time is between 8 and 10 AM.  He has no nightly nocturia and denies recurrent morning headaches.  He drinks caffeine in the form of soda, about 3/day.  He drinks alcohol rarely.  He is a non-smoker.  He went on a fishing trip not too long ago with former high school friends and was told that he snores mildly.  He is working on weight loss.  He has had slow weight gain over time.  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, has poor balance   Concussion 07/19/2016   fell down stair onto concrete   Hemorrhoids    Hyperlipidemia 6/11   started meds   Hypertension 12/2009   Left ankle pain    MRI  in 06/2015 did not show specific internal derangement, had mild degenerative chondral thinning in the tibiotalar joint w/o a focal lesion   MVP (mitral valve prolapse)    dx years ago   Wart    L foot, non- healing lesion, f/u by derm    His Past Surgical History Is Significant For: Past Surgical History:  Procedure Laterality Date   COLONOSCOPY  2016   INCISION AND DRAINAGE Left 08/01/2020   LEG SURGERY Bilateral    release muscles d/t "cerebral palsy"    His Family History Is Significant For: Family History  Problem Relation Age of Onset   Coronary artery disease Mother        onset?   Hypertension Mother    Skin cancer Mother    Hypertension Father    Diabetes Father    Heart failure Father    Sleep apnea Father    Colon polyps Sister    Coronary artery disease Maternal Grandmother    Lung cancer Other        aunt, uncle , smokers    Breast cancer Other        cousins   Stroke Neg Hx    Colon cancer Neg Hx    Prostate cancer Neg Hx    Esophageal cancer Neg Hx  Stomach cancer Neg Hx    Rectal cancer Neg Hx     His Social History Is Significant For: Social History   Socioeconomic History   Marital status: Single    Spouse name: Not on file   Number of children: 0   Years of education: Not on file   Highest education level: Bachelor's degree (e.g., BA, AB, BS)  Occupational History   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[employer]    Employer: [employer]  Tobacco Use   Smoking status: Never   Smokeless tobacco: Never  Vaping Use   Vaping status: Never Used  Substance and Sexual Activity   Alcohol use: Yes    Comment: occ beer   Drug use: No   Sexual activity: Not on file  Other Topics Concern   Not on file  Social History Narrative   Single, no children, lives by himself   Pt works    Social Drivers of Corporate investment banker Strain: Low Risk  (01/18/2024)   Overall Financial Resource Strain (CARDIA)    Difficulty of Paying Living Expenses: Not hard at all   Food Insecurity: No Food Insecurity (01/18/2024)   Hunger Vital Sign    Worried About Running Out of Food in the Last Year: Never true    Ran Out of Food in the Last Year: Never true  Transportation Needs: No Transportation Needs (01/18/2024)   PRAPARE - Administrator, Civil Service (Medical): No    Lack of Transportation (Non-Medical): No  Physical Activity: Insufficiently Active (01/18/2024)   Exercise Vital Sign    Days of Exercise per Week: 2 days    Minutes of Exercise per Session: 20 min  Stress: No Stress Concern Present (01/18/2024)   [employer] of Occupational Health - Occupational Stress Questionnaire    Feeling of Stress : Only a little  Social Connections: Unknown (01/18/2024)   Social Connection and Isolation Panel [NHANES]    Frequency of Communication with Friends and Family: More than three times a week    Frequency of Social Gatherings with Friends and Family: More than three times a week    Attends Religious Services: Patient declined    Database administrator or Organizations: Patient declined    Attends Banker Meetings: Not on file    Marital Status: Never married    His Allergies Are:  Allergies  Allergen Reactions   Dust Mite Extract    Mold Extract [Trichophyton] Cough  :   His Current Medications Are:  Outpatient Encounter Medications as of 04/15/2024  Medication Sig   losartan  (COZAAR ) 50 MG tablet Take 1 tablet (50 mg total) by mouth daily.   simvastatin  (ZOCOR ) 40 MG tablet Take 1 tablet (40 mg total) by mouth at bedtime.   triamcinolone cream (KENALOG) 0.1 %    albuterol  (VENTOLIN  HFA) 108 (90 Base) MCG/ACT inhaler Inhale 2 puffs into the lungs every 4 (four) hours as needed for wheezing or shortness of breath.   Albuterol -Budesonide (AIRSUPRA ) 90-80 MCG/ACT AERO Inhale 2 puffs into the lungs 4 (four) times daily as needed.   baclofen  (LIORESAL ) 10 MG tablet TAKE 1/2 TABLET BY MOUTH THREE TIMES DAILY (Patient not taking:  Reported on 12/18/2023)   fluticasone  (FLONASE ) 50 MCG/ACT nasal spray Place 1 spray into both nostrils daily.   guaiFENesin  (MUCINEX ) 600 MG 12 hr tablet Take 1,200 mg by mouth 2 (two) times daily.   HYDROcodone  bit-homatropine (HYCODAN) 5-1.5 MG/5ML syrup Take 5 mLs by mouth  at bedtime as needed for cough.   No facility-administered encounter medications on file as of 04/15/2024.  :   Review of Systems:  Out of a complete 14 point review of systems, all are reviewed and negative with the exception of these symptoms as listed below:   Review of Systems  Neurological:        Pt here for sleep consult Pt states some snoring,hypertension Pt denies headaches,fatigue,sleep study,cpap machine    ESS:5 FSS:16     Objective:  Neurological Exam  Physical Exam Physical Examination:   Vitals:   04/15/24 0920  BP: 114/72  Pulse: 85    General Examination: The patient is a very pleasant 58 y.o. male in no acute distress. He appears well-developed and well-nourished and well groomed.   HEENT: Normocephalic, atraumatic, pupils are equal, round and reactive to light, extraocular tracking is good without limitation to gaze excursion or nystagmus noted. Hearing is grossly intact. Face is symmetric with normal facial animation. Speech is clear with no dysarthria noted. There is no hypophonia. There is no lip, neck/head, jaw or voice tremor. Neck is supple with full range of passive and active motion. There are no carotid bruits on auscultation. Oropharynx exam reveals: mild mouth dryness, adequate dental hygiene and moderate airway crowding, due to airway entry tonsillar size of about 1+ bilaterally, right side easier to see.  Mallampati class II, slightly wider uvula, slightly larger tongue.  Tongue protrudes centrally and palate elevates symmetrically, neck circumference 17 7/8 inches.  Significant overbite noted.  Evidence of tooth grinding in the front teeth.  Chest: Clear to auscultation  without wheezing, rhonchi or crackles noted.  Heart: S1+S2+0, regular and normal without murmurs, rubs or gallops noted.   Abdomen: Soft, non-tender and non-distended.  Extremities: There is trace pitting edema in the distal lower extremities bilaterally.   Skin: Warm and dry without trophic changes noted.   Musculoskeletal: exam reveals no obvious joint deformities.   Neurologically:  Mental status: The patient is awake, alert and oriented in all 4 spheres. His immediate and remote memory, attention, language skills and fund of knowledge are appropriate. There is no evidence of aphasia, agnosia, apraxia or anomia. Speech is clear with normal prosody and enunciation. Thought process is linear. Mood is normal and affect is normal.  Cranial nerves II - XII are as described above under HEENT exam.  Motor exam: Normal bulk, with the exception of small leg caliber bilaterally, very mild weakness in both legs. There is no obvious action or resting tremor.  Fine motor skills and coordination: grossly intact.  Cerebellar testing: No dysmetria or intention tremor. There is no truncal or gait ataxia.  Sensory exam: intact to light touch in the upper and lower extremities.  Gait, station and balance: He stands with mild difficulty and pushes himself up, he walks without a walking aid, he has a mild limp bilaterally.    Assessment and Plan:  In summary, ESVIN HNAT Schneider is a very pleasant 58 y.o.-year old male with an underlying medical history of allergies, cerebral palsy, history of concussion, hypertension, hyperlipidemia, mitral valve prolapse, and overweight state, whose history and physical exam are concerning for sleep disordered breathing, particularly obstructive sleep apnea (OSA). While a laboratory attended sleep study is typically considered "gold standard" for evaluation of sleep disordered breathing, we mutually agreed to proceed with a home sleep test at this time, particularly because his  second shift schedule.   I had a long chat with the patient  about my findings and the diagnosis of sleep apnea, particularly OSA, its prognosis and treatment options. We talked about medical/conservative treatments, surgical interventions and non-pharmacological approaches for symptom control. I explained, in particular, the risks and ramifications of untreated moderate to severe OSA, especially with respect to developing cardiovascular disease down the road, including congestive heart failure (CHF), difficult to treat hypertension, cardiac arrhythmias (particularly A-fib), neurovascular complications including TIA, stroke and dementia. Even type 2 diabetes has, in part, been linked to untreated OSA. Symptoms of untreated OSA may include (but may not be limited to) daytime sleepiness, nocturia (i.e. frequent nighttime urination), memory problems, mood irritability and suboptimally controlled or worsening mood disorder such as depression and/or anxiety, lack of energy, lack of motivation, physical discomfort, as well as recurrent headaches, especially morning or nocturnal headaches. We talked about the importance of maintaining a healthy lifestyle and striving for healthy weight. In addition, we talked about the importance of striving for and maintaining good sleep hygiene. I recommended a sleep study at this time. I outlined the differences between a laboratory attended sleep study which is considered more comprehensive and accurate over the option of a home sleep test (HST); the latter may lead to underestimation of sleep disordered breathing in some instances and does not help with diagnosing upper airway resistance syndrome and is not accurate enough to diagnose primary central sleep apnea typically. I outlined possible surgical and non-surgical treatment options of OSA, including the use of a positive airway pressure (PAP) device (i.e. CPAP, AutoPAP/APAP or BiPAP in certain circumstances), a custom-made  dental device (aka oral appliance, which would require a referral to a specialist dentist or orthodontist typically, and is generally speaking not considered for patients with full dentures or edentulous state), upper airway surgical options, such as traditional UPPP (which is not considered a first-line treatment) or the Inspire device (hypoglossal nerve stimulator, which would involve a referral for consultation with an ENT surgeon, after careful selection, following inclusion criteria - also not first-line treatment). I explained the PAP treatment option to the patient in detail, as this is generally considered first-line treatment.  The patient indicated that he would be willing to try PAP therapy, if the need arises. I explained the importance of being compliant with PAP treatment, not only for insurance purposes but primarily to improve patient's symptoms symptoms, and for the patient's long term health benefit, including to reduce His cardiovascular risks longer-term.    We will pick up our discussion about the next steps and treatment options after testing.  We will keep him posted as to the test results by phone call and/or MyChart messaging where possible.  We will plan to follow-up in sleep clinic accordingly as well.  I answered all his questions today and the patient was in agreement.   I encouraged him to call with any interim questions, concerns, problems or updates or email us  through MyChart.  Generally speaking, sleep test authorizations may take up to 2 weeks, sometimes less, sometimes longer, the patient is encouraged to get in touch with us  if they do not hear back from the sleep lab staff directly within the next 2 weeks.  Thank you very much for allowing me to participate in the care of this nice patient. If I can be of any further assistance to you please do not hesitate to call me at [phone].  Sincerely,   Adrian Fairy, MD, PhD

## 2024-04-15 NOTE — Patient Instructions (Signed)

## 2024-05-04 ENCOUNTER — Telehealth: Payer: Self-pay | Admitting: Physical Medicine & Rehabilitation

## 2024-05-04 NOTE — Telephone Encounter (Signed)
 Pt called in requesting medication refill on   baclofen  (LIORESAL ) 10 MG tablet   Patient would like it sent to pharmacy on file - pt thought he could be off of it but states he needs to be back on it

## 2024-05-05 NOTE — Telephone Encounter (Signed)
 Pt.wants a refill on Baclofen  but can't receive one until an appointment with dr. Sharl Davies is made. Please advise.

## 2024-05-06 ENCOUNTER — Other Ambulatory Visit: Payer: Self-pay | Admitting: Internal Medicine

## 2024-05-14 ENCOUNTER — Encounter: Attending: Physical Medicine & Rehabilitation | Admitting: Physical Medicine & Rehabilitation

## 2024-05-14 ENCOUNTER — Encounter: Payer: Self-pay | Admitting: Physical Medicine & Rehabilitation

## 2024-05-14 VITALS — BP 125/82 | HR 76 | Ht 68.0 in | Wt 191.2 lb

## 2024-05-14 DIAGNOSIS — G801 Spastic diplegic cerebral palsy: Secondary | ICD-10-CM | POA: Insufficient documentation

## 2024-05-14 MED ORDER — BACLOFEN 10 MG PO TABS: ORAL_TABLET | ORAL | 1 refills | Status: AC

## 2024-05-14 NOTE — Progress Notes (Signed)
 Subjective:    Patient ID: Adrian Schneider, male    DOB: 13-May-1966, 58 y.o.   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  He has run out of his baclofen  and wishes to renew his prescription.  He has had no major changes to his health over the last 18 months.  His kidney functions were reviewed.  He continues to follow-up with his primary care physician.  He has a couple bouts of flu which led to not working out in the gym for a a while. His last Botox injection was 2021 He had an episode last week where he had a sudden jolt of pain in the left ankle.  It caused him to fall.  He did not step in any type of pothole or uneven surface.  He did say that his foot was in a dorsiflexed position.  He has had no residual pain numbness or tingling in the foot or ankle area.  He has had no swelling in the ankle joint or any discoloration  Examination General no acute distress Mood and affect are appropriate    Pain Inventory Average Pain 1 Pain Right Now 0 My pain is sharp and stabbing, random  In the last 24 hours, has pain interfered with the following? General activity 3 Relation with others 0 Enjoyment of life 3 What TIME of day is your pain at its worst? varies Sleep (in general) NA  Pain is worse with: walking Pain improves with: No pain Relief from Meds: No pain medication  Family History  Problem Relation Age of Onset   Coronary artery disease Mother        onset?   Hypertension Mother    Skin cancer Mother    Hypertension Father    Diabetes Father    Heart failure Father    Sleep apnea Father    Colon polyps Sister    Coronary artery disease Maternal Grandmother    Lung cancer Other        aunt, uncle , smokers    Breast cancer Other        cousins   Stroke Neg Hx    Colon cancer Neg Hx    Prostate cancer Neg Hx    Esophageal cancer Neg Hx    Stomach cancer Neg Hx    Rectal cancer  Neg Hx    Social History   Socioeconomic History   Marital status: Single    Spouse name: Not on file   Number of children: 0   Years of education: Not on file   Highest education level: Bachelor's degree (e.g., BA, AB, BS)  Occupational History   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[employer]    Employer: [employer]  Tobacco Use   Smoking status: Never   Smokeless tobacco: Never  Vaping Use   Vaping status: Never Used  Substance and Sexual Activity   Alcohol use: Yes    Comment: occ beer   Drug use: No   Sexual activity: Not on file  Other Topics Concern   Not on file  Social History Narrative   Single, no children, lives by himself   Pt works    Social Drivers of Corporate investment banker Strain: Low Risk  (01/18/2024)   Overall Financial Resource Strain (CARDIA)    Difficulty of Paying Living Expenses: Not hard at all  Food Insecurity: No Food Insecurity (01/18/2024)  Hunger Vital Sign    Worried About Running Out of Food in the Last Year: Never true    Ran Out of Food in the Last Year: Never true  Transportation Needs: No Transportation Needs (01/18/2024)   PRAPARE - Administrator, Civil Service (Medical): No    Lack of Transportation (Non-Medical): No  Physical Activity: Insufficiently Active (01/18/2024)   Exercise Vital Sign    Days of Exercise per Week: 2 days    Minutes of Exercise per Session: 20 min  Stress: No Stress Concern Present (01/18/2024)   [employer] of Occupational Health - Occupational Stress Questionnaire    Feeling of Stress : Only a little  Social Connections: Unknown (01/18/2024)   Social Connection and Isolation Panel    Frequency of Communication with Friends and Family: More than three times a week    Frequency of Social Gatherings with Friends and Family: More than three times a week    Attends Religious Services: Patient declined    Database administrator or Organizations: Patient declined    Attends Banker Meetings: Not on  file    Marital Status: Never married   Past Surgical History:  Procedure Laterality Date   COLONOSCOPY  2016   INCISION AND DRAINAGE Left 08/01/2020   LEG SURGERY Bilateral    release muscles d/t cerebral palsy   Past Surgical History:  Procedure Laterality Date   COLONOSCOPY  2016   INCISION AND DRAINAGE Left 08/01/2020   LEG SURGERY Bilateral    release muscles d/t cerebral palsy   Past Medical History:  Diagnosis Date   Allergy    dust mold   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, has poor balance   Concussion 07/19/2016   fell down stair onto concrete   Hemorrhoids    Hyperlipidemia 6/11   started meds   Hypertension 12/2009   Left ankle pain    MRI in 06/2015 did not show specific internal derangement, had mild degenerative chondral thinning in the tibiotalar joint w/o a focal lesion   MVP (mitral valve prolapse)    dx years ago   Wart    L foot, non- healing lesion, f/u by derm   BP 125/82 (BP Location: Left Arm, Patient Position: Sitting, Cuff Size: Large)   Pulse 76   Ht 5' 8 (1.727 m)   Wt 191 lb 3.2 oz (86.7 kg)   SpO2 97%   BMI 29.07 kg/m   Opioid Risk Score:   Fall Risk Score:  `1  Depression screen [facility] 2/9     05/14/2024   11:34 AM 12/18/2023   10:04 AM 05/01/2023    9:47 AM 12/19/2022    9:15 AM 08/09/2022   11:24 AM 06/18/2022    9:12 AM 12/19/2021    8:25 AM  Depression screen [facility] 2/9  Decreased Interest 0 0 0 0 0 0 0  Down, Depressed, Hopeless 0 0 0 0 0 0 1  PHQ - 2 Score 0 0 0 0 0 0 1      Review of Systems  Musculoskeletal:        Patient reports having a white hot jolt, lightning bolt feeling in left foot at various times. Patient states hard to explain  All other systems reviewed and are negative.      Objective:   Physical Exam General No acute distress Mood affect appropriate Left ankle no evidence of effusion no erythema no ecchymosis.  No pain with  range of motion.  He does have a tight heel cord.  No tenderness  palpation along the ligaments of the left ankle or the Achilles. Ambulates without pain he does have absence of heel strike he is up on his toes.  His knee control is good Motor strength is 4/5 bilateral hip flexion knee extension is 4+ knee flexion 4 - ankle dorsiflexion 3 - bilaterally. Sensation intact bilaterally in lower extremity to light touch. Tone MAS 1 in bilateral hamstrings MAS 3 bilateral ankle plantar flexors.  No evidence of clonus at the ankle       Assessment & Plan:  1.  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  Intermittent ankle pain exam negative, we discussed possibility of ankle impingement syndrome.  We discussed Ortho or podiatry referral should this become more frequent.  Patient will call if he wishes to pursue this We discussed the need for yearly visits if we are to prescribe medication on a ongoing basis.

## 2024-05-15 ENCOUNTER — Ambulatory Visit (INDEPENDENT_AMBULATORY_CARE_PROVIDER_SITE_OTHER): Admitting: Neurology

## 2024-05-15 DIAGNOSIS — G4763 Sleep related bruxism: Secondary | ICD-10-CM

## 2024-05-15 DIAGNOSIS — E663 Overweight: Secondary | ICD-10-CM

## 2024-05-15 DIAGNOSIS — R0683 Snoring: Secondary | ICD-10-CM

## 2024-05-15 DIAGNOSIS — Z9189 Other specified personal risk factors, not elsewhere classified: Secondary | ICD-10-CM

## 2024-05-15 DIAGNOSIS — Z820 Family history of epilepsy and other diseases of the nervous system: Secondary | ICD-10-CM

## 2024-05-15 DIAGNOSIS — G4733 Obstructive sleep apnea (adult) (pediatric): Secondary | ICD-10-CM

## 2024-05-15 DIAGNOSIS — R635 Abnormal weight gain: Secondary | ICD-10-CM

## 2024-06-02 ENCOUNTER — Ambulatory Visit: Payer: Commercial Managed Care - PPO | Admitting: Internal Medicine

## 2024-06-02 VITALS — BP 126/84 | HR 82 | Temp 98.4°F | Resp 16 | Ht 68.0 in | Wt 195.0 lb

## 2024-06-02 DIAGNOSIS — E785 Hyperlipidemia, unspecified: Secondary | ICD-10-CM | POA: Diagnosis not present

## 2024-06-02 DIAGNOSIS — R739 Hyperglycemia, unspecified: Secondary | ICD-10-CM | POA: Diagnosis not present

## 2024-06-02 DIAGNOSIS — I10 Essential (primary) hypertension: Secondary | ICD-10-CM | POA: Diagnosis not present

## 2024-06-02 DIAGNOSIS — G801 Spastic diplegic cerebral palsy: Secondary | ICD-10-CM

## 2024-06-02 LAB — BASIC METABOLIC PANEL WITH GFR
BUN: 10 mg/dL (ref 6–23)
CO2: 27 meq/L (ref 19–32)
Calcium: 8.9 mg/dL (ref 8.4–10.5)
Chloride: 104 meq/L (ref 96–112)
Creatinine, Ser: 0.91 mg/dL (ref 0.40–1.50)
GFR: 91.66 mL/min (ref >60.00)
Glucose, Bld: 95 mg/dL (ref 70–99)
Potassium: 3.9 meq/L (ref 3.5–5.1)
Sodium: 138 meq/L (ref 135–145)

## 2024-06-02 LAB — HEMOGLOBIN A1C: Hgb A1c MFr Bld: 6.1 % (ref 4.6–6.5)

## 2024-06-02 NOTE — Assessment & Plan Note (Signed)
 Prediabetes: Check A1c HTN: On losartan , check a BMP.  BP today looks good, recommend to check from time to time. Cerebral palsy, poor balance: Had a fall, patient think is related to to not taking back, complaining regularly, he is back on it. Also states a cane does not help much to prevent a falls. OSA?  To have a sleep study soon, he is somewhat concerned about it, advised that if he indeed has sleep apnea CPAP will be of great benefit. Vaccine advice provided RTC 6 months CPX

## 2024-06-02 NOTE — Patient Instructions (Addendum)
 Vaccines I recommend: Flu shot every fall COVID booster  Check the  blood pressure regularly Blood pressure goal:  between 110/65 and  135/85. If it is consistently higher or lower, let me know     GO TO THE LAB :  Get the blood work   Your results will be posted on MyChart with my comments  Next office visit for a physical exam in 6 months  please make an appointment before you leave today

## 2024-06-02 NOTE — Progress Notes (Signed)
   Subjective:    Patient ID: Adrian Schneider, male    DOB: 1966/08/06, 57 y.o.   MRN: [id number]  DOS:  06/02/2024 Type of visit - description: Follow-up  Since the last visit had a fall.  No major consequence fortunately.   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, has poor balance   Concussion 07/19/2016   fell down stair onto concrete   Hemorrhoids    Hyperlipidemia 6/11   started meds   Hypertension 12/2009   Left ankle pain    MRI in 06/2015 did not show specific internal derangement, had mild degenerative chondral thinning in the tibiotalar joint w/o a focal lesion   MVP (mitral valve prolapse)    dx years ago   Wart    L foot, non- healing lesion, f/u by derm    Past Surgical History:  Procedure Laterality Date   COLONOSCOPY  2016   INCISION AND DRAINAGE Left 08/01/2020   LEG SURGERY Bilateral    release muscles d/t cerebral palsy    Current Outpatient Medications  Medication Instructions   baclofen  (LIORESAL ) 10 MG tablet TAKE 1/2 TABLET BY MOUTH THREE TIMES DAILY   fluticasone  (FLONASE ) 50 MCG/ACT nasal spray 1 spray, Daily   losartan  (COZAAR ) 50 MG tablet TAKE 1 TABLET(50 MG) BY MOUTH DAILY   simvastatin  (ZOCOR ) 40 mg, Oral, Daily at bedtime   triamcinolone cream (KENALOG) 0.1 %        Objective:   Physical Exam BP 126/84   Pulse 82   Temp 98.4 F (36.9 C) (Oral)   Resp 16   Ht 5' 8 (1.727 m)   Wt 195 lb (88.5 kg)   SpO2 97%   BMI 29.65 kg/m  General:   Well developed, NAD, BMI noted. HEENT:  Normocephalic . Face symmetric, atraumatic Lungs:  CTA B Normal respiratory effort, no intercostal retractions, no accessory muscle use. Heart: RRR,  no murmur.  Lower extremities: no pretibial edema bilaterally  Skin: Not pale. Not jaundice Neurologic:  alert & oriented X3.  Speech normal, gait at baseline.   Psych--  Cognition and judgment appear  intact.  Cooperative with normal attention span and concentration.  Behavior appropriate. No anxious or depressed appearing.      Assessment     Problem list Prediabetes HTN Hyperlipidemia,  Baseline labs 02/2009--- total cholesterol 279, LDL 207  Cerebral palsy,   poor balance DJD - sees [facility]   Sees dermatology q year, h/o groin eczema E.D. Tinnitus ~ 11-2016, saw Dr. Colby    PLAN: Prediabetes: Check A1c HTN: On losartan , check a BMP.  BP today looks good, recommend to check from time to time. Cerebral palsy, poor balance: Had a fall, patient think is related to to not taking back, complaining regularly, he is back on it. Also states a cane does not help much to prevent a falls. OSA?  To have a sleep study soon, he is somewhat concerned about it, advised that if he indeed has sleep apnea CPAP will be of great benefit. Vaccine advice provided RTC 6 months CPX

## 2024-06-05 ENCOUNTER — Ambulatory Visit: Payer: Self-pay | Admitting: Internal Medicine

## 2024-06-18 NOTE — Progress Notes (Signed)
 See procedure note.

## 2024-06-22 ENCOUNTER — Ambulatory Visit: Payer: Self-pay | Admitting: Neurology

## 2024-06-22 DIAGNOSIS — G4733 Obstructive sleep apnea (adult) (pediatric): Secondary | ICD-10-CM

## 2024-06-22 NOTE — Procedures (Signed)
 [facility]  HOME SLEEP TEST (SANSA) REPORT (Mail-Out Device):   STUDY DATE: 06/10/2024  DOB: [birth year]  MRN: [id number]  ORDERING CLINICIAN: True Mar, MD, PhD   REFERRING CLINICIAN: Amon Aloysius BRAVO, MD   CLINICAL INFORMATION/HISTORY: 58 year old male with an underlying medical history of allergies, cerebral palsy, history of concussion, hypertension, hyperlipidemia, mitral valve prolapse, and overweight state, who reports snoring and waking up with a snort at times. He was encouraged by his dentist to get checked out for obstructive sleep apnea. He has a history of teeth grinding.   PATIENT'S LAST REPORTED EPWORTH SLEEPINESS SCORE (ESS): 5/24.  BMI (at the time of sleep clinic visit and/or test date): 29.9 kg/m  FINDINGS:   Study Protocol:    The SANSA single-point-of-skin-contact chest-worn sensor - an FDA cleared and DOT approved type 4 home sleep test device - measures eight physiological channels,  including blood oxygen saturation (measured via PPG [photoplethysmography]), EKG-derived heart rate, respiratory effort, chest movement (measured via accelerometer), snoring, body position, and actigraphy. The device is designed to be worn for up to 10 hours per study.   Sleep Summary:   Total Recording Time (hours, min): 7 hours, 22 min  Total Effective Sleep Time (hours, min):  4 hours, 45 min  Sleep Efficiency (%):    80%   Respiratory Indices:   Calculated sAHI (per hour):  13.9/hour         Oxygen Saturation Statistics:    Oxygen Saturation (%) Mean: 94.6%   Minimum oxygen saturation (%):                 80%   O2 Saturation Range (%): 80-99.9%   Time below or at 88% saturation: 2 min   Pulse Rate Statistics:   Pulse Mean (bpm):    67/min    Pulse Range (56-101/min)   Snoring: Intermittent, mild  IMPRESSION/DIAGNOSES:   OSA (obstructive sleep apnea)   RECOMMENDATIONS:   This home sleep test demonstrates overall mild obstructive  sleep apnea with a total AHI of 13.9/hour and O2 nadir of 80%. Snoring was detected, intermittently in the mild range. Given the patient's medical history and sleep related complaints, therapy with a positive airway pressure device is recommended. Treatment can be achieved in the form of autoPAP trial/titration at home for now. A full night, in-lab PAP titration study may aid in improving proper treatment settings and with mask fit, if needed, down the road. Alternative treatments may include weight loss (where appropriate) along with avoidance of the supine sleep position (if possible), or an oral appliance in appropriate candidates.   Please note that untreated obstructive sleep apnea may carry additional perioperative morbidity. Patients with significant obstructive sleep apnea should receive perioperative PAP therapy and the surgeons and particularly the anesthesiologist should be informed of the diagnosis and the severity of the sleep disordered breathing. The patient should be cautioned not to drive, work at heights, or operate dangerous or heavy equipment when tired or sleepy. Review and reiteration of good sleep hygiene measures should be pursued with any patient. Other causes of the patient's symptoms, including circadian rhythm disturbances, an underlying mood disorder, medication effect and/or an underlying medical problem cannot be ruled out based on this test. Clinical correlation is recommended.  The patient and his referring provider will be notified of the test results. The patient will be seen in follow up in sleep clinic at [facility], as necessary.  I certify that I have reviewed the raw data recording prior to  the issuance of this report in accordance with the standards of the American Academy of Sleep Medicine (AASM).    INTERPRETING PHYSICIAN:   True Mar, MD, PhD Medical Director, [facility]) Diplomat, ABPN (Neurology and Sleep)   [facility] [address] [place], [place] [address] [phone]

## 2024-06-24 NOTE — Telephone Encounter (Signed)
-----   Message from True Mar sent at 06/22/2024  5:13 PM EDT ----- Patient referred by PCP, seen by me on 04/15/2024, patient had HST on 06/10/2024.    Please call and notify the patient that the recent home sleep test showed obstructive sleep apnea. OSA is overall mild, but worth treating to see if he feels better after treatment. To that end I  recommend treatment for this in the form of autoPAP, which means, that we don't have to bring him in for a sleep study with CPAP, but will let him try an autoPAP machine at home, through a DME  company (of his choice, or as per insurance requirement). The DME representative will educate him on how to use the machine, how to put the mask on, etc. I have placed an order in the chart. Please  send referral, talk to patient, send report to referring MD. We will need a FU in sleep clinic in about 2.-3 months post-PAP set up (which is usually an insurance-mandated appointment to monitor  compliance), please arrange that with me or one of our NPs. Please also go over the need for compliance with treatment (including the insurance-imposed minimum compliance percentage). Thanks,   True Mar, MD, PhD [facility])    ----- Message ----- From: Mar True, MD Sent: 06/22/2024   5:11 PM EDT To: True Mar, MD

## 2024-06-24 NOTE — Telephone Encounter (Addendum)
 I called pt and relayed sleep study results, mild OSA.  Recommend autopap to see if makes him feel better, use nightly 4 hours minimum for insurance compliance. See back in office for appt 2-3 mon. Call back after start using machine to schedule appt. Sent message to DME [employer]. He asked about oral dental device.  I told him Dr Buck recommended autopap at this time, maybe option if not tolerated.  He verbalized understanding.

## 2024-06-26 ENCOUNTER — Encounter: Payer: Self-pay | Admitting: Neurology

## 2024-06-29 NOTE — Telephone Encounter (Signed)
 RE: new autopap user Received: 3 days ago New, Adine Neysa Nena GORMAN, RN; Joylene Carlean Sheree Leveda Jackson Easter Delsa Eleanor; 1 other Received, thank you!     Previous Messages    ----- Message ----- From: Neysa Nena GORMAN, RN Sent: 06/24/2024  10:17 AM EDT To: Adine Joylene; Avelina Jackson; Ephraim Dollar* Subject: new autopap user                              Good morning,  New order in epic for pt  Adrian Schneider Male, 58 y.o., 03-15-1966 Pronouns: he/him/his MRN: [id number] Phone: [phone]   [place]

## 2024-06-30 NOTE — Telephone Encounter (Signed)
 I usually recommend treatment with autoPAP first, since it is the quickest and most effective treatment. If pt prefers a dental device, please place referral after talking to him, and ask him if he has a dentist in mind.

## 2024-08-12 ENCOUNTER — Encounter: Payer: Self-pay | Admitting: Neurology

## 2024-10-05 ENCOUNTER — Other Ambulatory Visit: Payer: Self-pay | Admitting: Internal Medicine

## 2024-11-03 ENCOUNTER — Encounter: Payer: Self-pay | Admitting: Neurology

## 2024-11-04 ENCOUNTER — Other Ambulatory Visit: Payer: Self-pay | Admitting: Internal Medicine

## 2024-12-07 ENCOUNTER — Encounter: Payer: Self-pay | Admitting: Internal Medicine

## 2024-12-07 ENCOUNTER — Ambulatory Visit: Admitting: Internal Medicine

## 2024-12-07 VITALS — BP 122/84 | HR 66 | Temp 98.1°F | Resp 16 | Ht 68.0 in | Wt 197.1 lb

## 2024-12-07 DIAGNOSIS — I10 Essential (primary) hypertension: Secondary | ICD-10-CM

## 2024-12-07 DIAGNOSIS — G801 Spastic diplegic cerebral palsy: Secondary | ICD-10-CM

## 2024-12-07 DIAGNOSIS — Z23 Encounter for immunization: Secondary | ICD-10-CM

## 2024-12-07 DIAGNOSIS — E785 Hyperlipidemia, unspecified: Secondary | ICD-10-CM | POA: Diagnosis not present

## 2024-12-07 LAB — CBC WITH DIFFERENTIAL/PLATELET
Basophils Absolute: 0 K/uL (ref 0.0–0.1)
Basophils Relative: 1 % (ref 0.0–3.0)
Eosinophils Absolute: 0.1 K/uL (ref 0.0–0.7)
Eosinophils Relative: 1.1 % (ref 0.0–5.0)
HCT: 43.3 % (ref 39.0–52.0)
Hemoglobin: 15 g/dL (ref 13.0–17.0)
Lymphocytes Relative: 26.6 % (ref 12.0–46.0)
Lymphs Abs: 1.2 K/uL (ref 0.7–4.0)
MCHC: 34.6 g/dL (ref 30.0–36.0)
MCV: 89.8 fl (ref 78.0–100.0)
Monocytes Absolute: 0.5 K/uL (ref 0.1–1.0)
Monocytes Relative: 10.6 % (ref 3.0–12.0)
Neutro Abs: 2.8 K/uL (ref 1.4–7.7)
Neutrophils Relative %: 60.7 % (ref 43.0–77.0)
Platelets: 277 K/uL (ref 150.0–400.0)
RBC: 4.82 Mil/uL (ref 4.22–5.81)
RDW: 13.6 % (ref 11.5–15.5)
WBC: 4.6 K/uL (ref 4.0–10.5)

## 2024-12-07 LAB — LIPID PANEL
Cholesterol: 144 mg/dL (ref 28–200)
HDL: 46.5 mg/dL
LDL Cholesterol: 80 mg/dL (ref 10–99)
NonHDL: 97.82
Total CHOL/HDL Ratio: 3
Triglycerides: 91 mg/dL (ref 10.0–149.0)
VLDL: 18.2 mg/dL (ref 0.0–40.0)

## 2024-12-07 LAB — BASIC METABOLIC PANEL WITH GFR
BUN: 10 mg/dL (ref 6–23)
CO2: 27 meq/L (ref 19–32)
Calcium: 9.1 mg/dL (ref 8.4–10.5)
Chloride: 104 meq/L (ref 96–112)
Creatinine, Ser: 0.88 mg/dL (ref 0.40–1.50)
GFR: 93.18 mL/min
Glucose, Bld: 98 mg/dL (ref 70–99)
Potassium: 4.2 meq/L (ref 3.5–5.1)
Sodium: 138 meq/L (ref 135–145)

## 2024-12-07 LAB — AST: AST: 25 U/L (ref 5–37)

## 2024-12-07 LAB — ALT: ALT: 21 U/L (ref 3–53)

## 2024-12-07 NOTE — Progress Notes (Signed)
 "  Subjective:    Patient ID: Adrian Schneider, male    DOB: 30-Dec-1965, 59 y.o.   MRN: [id number]  DOS:  12/07/2024 Follow-up  Discussed the use of AI scribe software for clinical note transcription with the patient, who gave verbal consent to proceed.  History of Present Illness Here for follow-up Obstructive sleep apnea and cpap intolerance - CPAP therapy initiated in mid-December - Full face mask and current nasal mask both uncomfortable and disruptive to sleep - Nasal mask causes nasal discomfort - Continues to wake feeling tired  History of cerebral palsy, fell at least once since the last time. Plans to continue staying active and regain his strength through exercise      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, has poor balance   Concussion 07/19/2016   fell down stair onto concrete   Hemorrhoids    Hyperlipidemia 6/11   started meds   Hypertension 12/2009   Left ankle pain    MRI in 06/2015 did not show specific internal derangement, had mild degenerative chondral thinning in the tibiotalar joint w/o a focal lesion   MVP (mitral valve prolapse)    dx years ago   Wart    L foot, non- healing lesion, f/u by derm    Past Surgical History:  Procedure Laterality Date   COLONOSCOPY  2016   INCISION AND DRAINAGE Left 08/01/2020   LEG SURGERY Bilateral    release muscles d/t cerebral palsy    Current Outpatient Medications  Medication Instructions   augmented betamethasone  dipropionate (DIPROLENE -AF) 0.05 % ointment Apply topically.   baclofen  (LIORESAL ) 10 MG tablet TAKE 1/2 TABLET BY MOUTH THREE TIMES DAILY   fluticasone  (FLONASE ) 50 MCG/ACT nasal spray 1 spray, Daily   losartan  (COZAAR ) 50 mg, Oral, Daily   simvastatin  (ZOCOR ) 40 mg, Oral, Daily at bedtime   triamcinolone cream (KENALOG) 0.1 %        Objective:   Physical Exam BP 122/84   Pulse 66   Temp 98.1  F (36.7 C) (Oral)   Resp 16   Ht 5' 8 (1.727 m)   Wt 197 lb 2 oz (89.4 kg)   SpO2 97%   BMI 29.97 kg/m  General:   Well developed, NAD, BMI noted. HEENT:  Normocephalic . Face symmetric, atraumatic Lungs:  CTA B Normal respiratory effort, no intercostal retractions, no accessory muscle use. Heart: RRR,  no murmur.  Lower extremities: no pretibial edema bilaterally  Skin: Not pale. Not jaundice Neurologic:  alert & oriented X3.  Speech normal, gait unassisted, at baseline, consistent with cerebral palsy  psych--  Cognition and judgment appear intact.  Cooperative with normal attention span and concentration.  Behavior appropriate. No anxious or depressed appearing.      Assessment   Problem list Prediabetes HTN Hyperlipidemia,  Baseline labs 02/2009--- total cholesterol 279, LDL 207  Cerebral palsy,   poor balance DJD - sees [facility] pr[facility]   Sees dermatology q year, h/o groin eczema E.D. OSA Dx 2025, rx Cpap  Tinnitus ~ 11-2016, saw Dr. Colby   Assessment & Plan OSA: Dx few months ago, difficulty adjusting to CPAP therapy.  Recommend to persist on using CPAP, benefit discussed Cerebral palsy with gait instability and recurrent falls Gait instability and recurrent falls attributed to cerebral palsy. Plans to strengthen legs and lose weight  to improve stability.  Declined at PT referral HTN  Continue losartan  50 mg oral daily.  Check BMP, CBC Hyperlipidemia Managed with simvastatin . Plan to monitor cholesterol levels and liver function. Preventive care: Had a flu shot, Tdap today, consider COVID-vaccine. RTC 4 months CPX     "

## 2024-12-07 NOTE — Patient Instructions (Addendum)
 Please read your instructions carefully.   GO TO THE LAB :  Get the blood work    Go to the front desk for the checkout Please make an appointment with for a physical in 4 months    Consider a covid booster

## 2024-12-07 NOTE — Assessment & Plan Note (Signed)
 OSA: Dx few months ago, difficulty adjusting to CPAP therapy.  Recommend to persist on using CPAP, benefit discussed Cerebral palsy with gait instability and recurrent falls Gait instability and recurrent falls attributed to cerebral palsy. Plans to strengthen legs and lose weight to improve stability.  Declined at PT referral HTN  Continue losartan  50 mg oral daily.  Check BMP, CBC Hyperlipidemia Managed with simvastatin . Plan to monitor cholesterol levels and liver function. Preventive care: Had a flu shot, Tdap today, consider COVID-vaccine. RTC 4 months CPX

## 2024-12-09 ENCOUNTER — Ambulatory Visit: Payer: Self-pay | Admitting: Internal Medicine

## 2024-12-16 ENCOUNTER — Ambulatory Visit: Admitting: Adult Health

## 2024-12-22 NOTE — Progress Notes (Unsigned)
 Setup date was 10/27/2024

## 2024-12-23 ENCOUNTER — Ambulatory Visit: Admitting: Adult Health

## 2024-12-23 ENCOUNTER — Encounter: Payer: Self-pay | Admitting: Adult Health

## 2024-12-23 VITALS — BP 141/87 | HR 95 | Ht 68.0 in | Wt 202.0 lb

## 2024-12-23 DIAGNOSIS — G4733 Obstructive sleep apnea (adult) (pediatric): Secondary | ICD-10-CM | POA: Diagnosis not present

## 2024-12-23 NOTE — Progress Notes (Signed)
 SABRA

## 2024-12-23 NOTE — Patient Instructions (Addendum)
 Your Plan:  Continue nightly use of CPAP with ensuring greater than 4 hours per night for optimal benefit   Continue to follow with your DME [employer] for any needed supplies or CPAP related concerns      Follow up in 3-4 months with Dr. Buck or call sooner if needed       Thank you for coming to see us  at [facility]. I hope we have been able to provide you high quality care today.  You may receive a patient satisfaction survey over the next few weeks. We would appreciate your feedback and comments so that we may continue to improve ourselves and the health of our patients.

## 2025-04-06 ENCOUNTER — Encounter: Admitting: Internal Medicine

## 2025-04-28 ENCOUNTER — Ambulatory Visit: Admitting: Neurology

## 2025-05-13 ENCOUNTER — Ambulatory Visit: Admitting: Physical Medicine & Rehabilitation
